# Patient Record
Sex: Male | Born: 1950 | Race: White | Hispanic: No | Marital: Married | Smoking: Former smoker
Health system: Southern US, Community
[De-identification: ages and names within clinical notes are randomized; demographics above are authoritative.]

## PROBLEM LIST (undated history)

## (undated) DIAGNOSIS — E119 Type 2 diabetes mellitus without complications: Secondary | ICD-10-CM

## (undated) DIAGNOSIS — I10 Essential (primary) hypertension: Secondary | ICD-10-CM

## (undated) DIAGNOSIS — E785 Hyperlipidemia, unspecified: Secondary | ICD-10-CM

## (undated) HISTORY — PX: INGUINAL HERNIA REPAIR: SUR1180

## (undated) HISTORY — DX: Hyperlipidemia, unspecified: E78.5

## (undated) HISTORY — DX: Type 2 diabetes mellitus without complications: E11.9

## (undated) HISTORY — DX: Essential (primary) hypertension: I10

---

## 2001-12-21 ENCOUNTER — Encounter: Payer: Self-pay | Admitting: Surgery

## 2001-12-22 ENCOUNTER — Ambulatory Visit (HOSPITAL_COMMUNITY): Admission: RE | Admit: 2001-12-22 | Discharge: 2001-12-22 | Payer: Self-pay | Admitting: Surgery

## 2002-01-13 ENCOUNTER — Ambulatory Visit (HOSPITAL_COMMUNITY): Admission: RE | Admit: 2002-01-13 | Discharge: 2002-01-13 | Payer: Self-pay | Admitting: Surgery

## 2004-02-14 ENCOUNTER — Encounter: Admission: RE | Admit: 2004-02-14 | Discharge: 2004-02-14 | Payer: Self-pay | Admitting: Internal Medicine

## 2005-04-29 ENCOUNTER — Inpatient Hospital Stay (HOSPITAL_COMMUNITY): Admission: EM | Admit: 2005-04-29 | Discharge: 2005-05-01 | Payer: Self-pay | Admitting: Emergency Medicine

## 2005-05-01 ENCOUNTER — Encounter (INDEPENDENT_AMBULATORY_CARE_PROVIDER_SITE_OTHER): Payer: Self-pay | Admitting: Specialist

## 2005-06-14 ENCOUNTER — Inpatient Hospital Stay (HOSPITAL_COMMUNITY): Admission: EM | Admit: 2005-06-14 | Discharge: 2005-06-16 | Payer: Self-pay | Admitting: Emergency Medicine

## 2008-04-20 ENCOUNTER — Encounter: Admission: RE | Admit: 2008-04-20 | Discharge: 2008-04-20 | Payer: Self-pay | Admitting: Internal Medicine

## 2011-01-06 ENCOUNTER — Encounter: Payer: Self-pay | Admitting: Internal Medicine

## 2011-05-03 NOTE — H&P (Signed)
[name], [name]          ACCOUNT NO.:  [id number]   MEDICAL RECORD NO.:  [id number]          PATIENT TYPE:  INP   LOCATION:  0101                         FACILITY:  [facility]   PHYSICIAN:  Mobolaji B. Bakare, M.D.DATE OF BIRTH:  1951/01/08   DATE OF ADMISSION:  06/14/2005  DATE OF DISCHARGE:                                HISTORY & PHYSICAL   PRIMARY CARE DOCTOR:  Dr. Ralene Ok.   GASTROENTEROLOGIST:  Dr. Elnoria Howard.   CHIEF COMPLAINT:  Dizziness.   HISTORY OF PRESENTING COMPLAINT:  [name] is a 60 year old [place]  gentleman who is known with alcohol abuse and alcohol related problems. He  was recently hospitalized in [facility] in May 2006. He started  experiencing dizziness and weakness yesterday. He did not have any syncopal  episode. There was no palpitations, no nausea, vomiting, chest pain. There  has been no diarrhea. The patient continues to drink alcohol. Otherwise,  stated that he has cut back and he drinks 1/5 of whiskey on the rocks. His  wife called Dr. Jacqulyn Bath office and he told to proceed to emergency room  for evaluation. He has since received 500 cc IV fluid bolus and he is  currently on IV fluid at 200 mL per hour. Orthostasis checked after IV fluid  showed a filling blood pressure of 132 supine, which dropped to 119 standing  with a heart rate of 86, which was maintained. The patient is on beta  blocker.  There has been no fever, no chills, no headaches, no dysuria or increased  frequency of micturition. He denies diaphoresis, chest pain, shortness of  breath, cough.  The patient had episode of abdomen pain yesterday, somewhat generalized and  this lasted about 20 minutes. Currently is pain free.   REVIEW OF SYSTEMS:  As in the HPI.   PAST MEDICAL HISTORY:  1.  Hypertension.  2.  Hyperlipidemia.  3.  Duodenitis/gastritis.  4.  Gastric and duodenal ulcers.  5.  Acute renal failure noted in May 2006.  On discharge is BUN and  creatinine were 26/1.5.  6.  Alcohol abuse.  7.  Tobacco abuse.   CURRENT MEDICATIONS:  1.  Lipitor 10 mg p.o. q.d.  2.  Nexium 40 mg p.o. q.d.  3.  Atenolol 100 mg p.o. q. day.  4.  Benicar 20/12.5 p.o. q.d.   ALLERGIES:  CODEINE causes itching.   SOCIAL HISTORY:  The patient continues to drink alcohol and smokes 1/2 pack  a day. He is a Production designer, theatre/television/film with [employer].   FAMILY HISTORY:  No family history of heart disease or cancer. He is married  and lives with his wife.   VITAL SIGNS:  Temperature 98.3, blood pressure 101/69 on initial evaluation  in the emergency department, currently after IV fluid, 132/92, pulse 86,  respiratory rate 18, 02 sat 96%. Orthostatic vitals:  Blood pressure 132/92  and pulse of 86 supine, 123/89 pulse of 81 sitting, 119/89 and pulse of 86  standing.   PHYSICAL EXAMINATION:  GENERAL:  He is not in respiratory distress.  HEENT:  Normocephalic, atraumatic. Pupils equal, round and reactive to  light. Extraocular  movements intact, no carotid bruits. No elevated JVD.  Mucous membranes moist.  LUNGS:  Clear __________ auscultations.  CVS:  S1, S2, regular.  No murmur, no gallop.  ABDOMEN:  Nondistended, soft, nontender. No palpable organomegaly, bowel  sounds presents, no audible systolic murmurs.  EXTREMITIES:  No pedal edema, no calf tenderness. Homan sign negative.  Dorsalis pedis pulses 2+ bilaterally.  CNS:  No focal neurological deficits.  SKIN:  No rash, no petechiae.   INITIAL LABORATORY DATA:  Cardiac markers, myoglobin 378, troponin-R and CK-  MB were normal. Second set of myoglobin was 309 with normal troponin-R and  CK-MB. D-dimer was slightly elevated 0.76. sodium 134, potassium 5.4,  chloride 101, bicarb 36, glucose 104, BUN 27, creatinine 3.0, total  bilirubin 2.9, alkaline phosphatase 76, AST 113, ALT 68. This specimen was  __________ hemolyzed and there was gross lipemia on the specimen. AST, ALT  in Apr 30, 2005 was 48 and 26, just like  the elevated AST with a BUN of 26  and a creatinine of 1.6 on the Apr 30, 2005. Total bilirubin of 1.1. CBC:  White cell 9.1, hemoglobin 12.2, hematocrit 6.5, MCV 96.6, platelets 234.  Neutrophils 50%, lymphocytes 40%, hematocrit and hemoglobin are improved  since last check in May.   ASSESSMENT AND PLAN:  [name] is 60 year old, [place] gentleman with a  history of alcohol abuse, gastric ulcer and duodenal ulcer, hypertension,  hyperlipidemia presenting with dizziness. He is somewhat orthostatic and had  episode of abdominal pain.   Laboratory findings include elevated BUN and creatinine, d-dimer elevated,  myoglobin and bilirubin.   1.  Renal insufficiency. Probably prerenal on a background of positive      orthostasis and ACE inhibitor with hydrochlorothiazide. Will hold      Benicar, recheck Bmet, continued IV fluid normal saline at 150 mL per      hour.  2.  Abdominal pain, which is now resolved. In view of elevated bilirubin,      will check ultrasound of gallbladder. The patient might have passed a      gallstone. Also check amylase and lipase, will use Dilaudid p.r.n. for      pain.  3.  Elevated d-dimer. I suspect this is probably compounded by the renal      insufficiency. The patient has no symptoms to suggest PE or calf      swelling. Will repeat d-dimer in the morning when the patient is fully      hydrated and if still elevated, will pursue further.  4.  Tobacco abuse. Smoking cessation counseling.  5.  Alcohol abuse. The patient is at high risk for alcohol withdrawal. Will      start on alcohol withdrawal protocol with Librium.  6.  Hyperlipidemia. Check fasting lipid profile.  7.  Hypertension. Will hold atenolol for now until the patient is well-      hydrated and blood pressure is sustained, is      stable.  8.  Gastric and duodenal ulcer. Continue on PPIs, gastritis/duodenitis      continue PPIs. 9.  Anemia. Most likely secondary to ulcers. We empirically  use NuIron 150      mg p.o. q.d.       MBB/MEDQ  D:  06/14/2005  T:  06/14/2005  Job:  [id number]   cc:   Ralene Ok, M.D.  [address]  [place]  [place] [address]  Fax: [phone]   Jordan Hawks. Elnoria Howard, MD  Fax: [phone]

## 2011-05-03 NOTE — Discharge Summary (Signed)
[name], [name]          ACCOUNT NO.:  [id number]   MEDICAL RECORD NO.:  [id number]          PATIENT TYPE:  INP   LOCATION:  1407                         FACILITY:  [facility]   PHYSICIAN:  Mallory Shirk, MD     DATE OF BIRTH:  12/05/1951   DATE OF ADMISSION:  06/14/2005  DATE OF DISCHARGE:  06/16/2005                                 DISCHARGE SUMMARY   DISCHARGE DIAGNOSES:  1.  Abdominal pain.  2. Alcohol abuse.  3. Tobacco abuse.  4.      Hyperlipidemia.  5. Hypertension.  6. Normocytic anemia.   DISCHARGE MEDICATIONS:  1.  Lopid 600 mg p.o. b.i.d.  2. Lipitor 20 mg p.o. daily.  3. Nexium 40 mg      p.o. daily.  4. Thiamine 1 mg p.o. daily.  5. Folate 1 mg tablet p.o.      daily.  6. Benicar HCTZ 20/12.5 1 tablet p.o. daily.  7. Atenolol 100 mg      p.o. daily.   Please note changes in outpatient medications:  1. Lopid 600 mg p.o. b.i.d.  has been added.  2. Lipitor has been increased from 10 mg p.o. daily to 20  mg p.o. daily.  3. Thiamine 100 mg p.o. daily.  4. Folate 1 mg p.o. daily  have been added.   FOLLOWUP APPOINTMENTS:  1.  Dr. Salome Spotted, [phone], within two to three days of discharge.  The      patient will call Dr. Wilmer Floor office to make an appointment.  The      patient needs to have a complete metabolic panel checked within seven      days of discharge by  Dr. Cindee Lame  2. Follow up with Dr. Elnoria Howard.  The      patient will call Dr. Haywood Pao office and follow up as needed.   HISTORY OF PRESENT ILLNESS:  Mr. Vonbehren is a very pleasant 60 year old  [place] gentleman who has a history of alcohol abuse and alcohol-related  problems.  The patient was recently hospitalized in 5/06 for abdominal  nausea, vomiting, chronic, intermittent, related to alcohol.  He presented  on 06/14/05 with complaints of dizziness and weakness.  He patient did not  have a syncopal episode.  No palpitations, nausea, vomiting, or chest pain,  no diarrhea.  The patient continued to drink  alcohol since discharge, but he  has now cut down to a fifth of whiskey daily.  On June 14, 2005, the  patient's wife called Dr. Wilmer Floor office, and she was told to bring the  patient to the emergency room for evaluation.  He was found to be  hypotensive and then received fluid bolus to which he responded.  After the  fluid bolus, his systolic blood pressure was 132 supine and dropped to 119  on standing with a heart rate of 86.  The patient has been on a beta blocker  before.  No fever, no chills, no headaches, no dysuria, or frequency of  urination.  No diaphoresis, chest pain, or any other complaints.   PAST MEDICAL HISTORY:  Significant for:  1. Hypertension.  2.  Hyperlipidemia.  3. Duodenitis/gastritis.  4. Gastric and duodenal ulcers.  5. Acute renal failure noted in May of 2006 with a discharge BUN and  creatinine of 26/1.5 on May 01, 2005.  6. Alcohol abuse.  7.  Tobacco abuse.   MEDICATIONS ON ADMISSION:  1.  Lipitor 10 mg p.o. daily.  2. Nexium 40 mg p.o. daily.  3.  Atenolol 100      mg p.o. daily.  4. Benicar 20/12.5 1 tablet p.o. daily.   ALLERGIES:  CODEINE causes itching.   PHYSICAL EXAMINATION ON ADMISSION:  VITAL SIGNS:  Blood pressure 101/69.  Subsequently, went to 132/92 with a half-liter fluid bolus.  Temperature  98.3, respiratory rate 18, O2 saturations 96% on room air.  Pulse 86.  GENERAL:  A middle-aged [place] European gentleman in no acute distress.  HEENT:  Normocephalic, atraumatic.  PERRL.  Sclerae are anicteric.  Mucous  membranes moist.  NECK:  Supple with no LAD, no JVD.  RESPIRATORY:  Lungs are clear to auscultation bilaterally with no wheezes  and no rales.  CARDIOVASCULAR:  S1, S2, regular rate and rhythm with no murmurs, rubs, or  gallops.  ABDOMEN:  Nondistended and soft with positive bowel sounds.  No tenderness  and no masses.  EXTREMITIES:  No cyanosis, clubbing, or edema.  NEUROLOGIC:  Nonfocal.   LABORATORY DATA:  Myoglobin 378,  troponin-I and CK-MB normal.  Sodium 134,  potassium 5.4, chloride 104, bicarbonate 36, glucose 104, BUN 27, creatinine  3.0, total bilirubin 2.9, alkaline phosphatase 76, AST 113, ALT 68.  Total  bilirubin 1.1.  WBC is 9.1, hemoglobin 12.2, hematocrit is 36.5, platelets  234.   IMAGING:  Chest x-ray:  No acute disease.  Ultrasound of the abdomen:  Contracted gallbladder.  Otherwise, negative.   HOSPITAL COURSE:  The patient was admitted to a monitored bed.  1. Nausea,  vomiting, and abdominal pain.  The patient's symptoms resolved during the  hospital stay.  He was initially NPO and then on clear diet.  Subsequently,  diet was advanced to a regular diet.  The patient tolerated the diet well.  On day of discharge, no nausea or vomiting.  The patient is eating a regular  diet.   Problem 2. Alcohol abuse.  The patient was counseled extensively to quit  drinking alcohol.  Outpatient detox/education programs were offered, but the  patient declined to accept these.  The patient's wife at bedside when this  discussion took place.   Problem 3. Tobacco abuse.  The patient was counseled to quit smoking.  Again, he refused any counseling or help.   Problem 4.  Hyperlipidemia.  Lipid profile showed a triglyceride level of  1848 and a cholesterol of 423.  The patient is on Lipitor 10 mg p.o. daily  which was increased to 20, Lopid 600 mg p.o. b.i.d. has been added to his  outpatient regimen.   Problem 5.  Hypertension.  The patient was normotensive during the hospital  stay.  His atenolol and Diovan had been held because of the initial  presentation with some hypotension.  He has been recommended to take these  once he is discharged.  Of note, cardiac enzymes were negative, three sets.   Problem 6. Anemia.  The patient has normocytic anemia.  On day of discharge,  the patient's hemoglobin and hematocrit were 10.6/30/3.  The patient has been followed by Dr. Elnoria Howard.  He has been asked to call Dr.  Elnoria Howard and set up an  outpatient followup.  The anemia is normocytic with MCV of 96.3.  the  patient has also been prescribed thiamine and folate because of his history  of alcohol abuse.   Problem 7. Acute renal insufficiency.  On the day of discharge, the  patient's BUN and creatinine were 16/1.5.  The initial presentation of acute  renal insufficiency secondary to dehydration.  After hydration, the  patient's BUN and creatinine is now within normal limits.   DISPOSITION:  The patient was discharged in stable condition.  On day of  discharge, his vital signs were:  Blood pressure 115/73, pulse 87,  respiratory rate 22, temperature 98.0, with no orthostatic changes in blood  pressure.  Blood pressure supine was 126, sitting up was 119, and standing  120 systolic.  The patient was ambulatory, able to walk the halls without  assistance.   He was advised to abstain from alcohol completely and advised that his  abdominal pain and symptoms will occur even with a single drink of alcohol.  The patient verbalized understanding of this.  He was also advised to take  his medications as prescribed, keep his followup appointments, and return to  the emergency department immediately upon onset of chest pain, shortness of  breath, dizziness, or any other symptom that may need medical attention.       GDK/MEDQ  D:  06/16/2005  T:  06/16/2005  Job:  [id number]   cc:   Salome Spotted, M.D.   Jordan Hawks Elnoria Howard, MD  Fax: [phone]

## 2011-05-03 NOTE — Discharge Summary (Signed)
[name], [name]          ACCOUNT NO.:  [id number]   MEDICAL RECORD NO.:  [id number]          PATIENT TYPE:  INP   LOCATION:  1503                         FACILITY:  [facility]   PHYSICIAN:  Jonna L. Robb Matar, M.D.DATE OF BIRTH:  10/17/1951   DATE OF ADMISSION:  04/29/2005  DATE OF DISCHARGE:  05/01/2005                                 DISCHARGE SUMMARY   PRIMARY CARE PHYSICIAN:  Ralene Ok, M.D.   GASTROENTEROLOGIST:  Jordan Hawks. Elnoria Howard, MD   FINAL DIAGNOSES:  1. Gastric and duodenal ulcers.  2. Gastritis, duodenitis.  3. Hypertension.  4. Hyperlipidemia.  5. Alcohol and tobacco abuse.  6. Acute renal failure.  7. Hypotension secondary to hypovolemia.     PROCEDURES:  Esophagogastroduodenoscopy on May 17th.   ALLERGIES:  CODEINE, gives him itching.   CODE STATUS:  Full.   HISTORY:  This 60 year old Polish-American male has an underlying history of  hypertension, hyperlipidemia, and chronic alcohol and tobacco use.  He had 3-  4 months of abdominal pain, nausea, vomiting, and heartburn, for which he  was taking Alka-Seltzer.  He has been having some weight loss, anorexia, and  the day of admission, he was getting very weak.   PHYSICAL EXAMINATION ON ADMISSION:  VITAL SIGNS:  He was mildly hypotensive.  Blood pressure came back up to 111 systolic status post a liter of fluids.  ABDOMEN:  Positive findings included abdominal tenderness, positive bowel  sounds.  No asterixis.   Initial laboratory work showed a BUN of 26, creatinine 1.5.  Normal liver  function tests, lipase, and amylase.  Platelet count slightly low at 126.   HOSPITAL COURSE:  The patient was rehydrated, placed on a nicotine patch.  The CAT scan was unremarkable.  He was put on multivitamins.  He was seen in  consultation with Dr. Jeani Hawking on May 16, who found on upper endoscopy  that the patient had multiple ulcers and gastritis.   DISPOSITION:  Patient is going to be discharged on his usual  medications of  Lipitor 10 daily, atenolol 100 daily, Benicar/HCT 20/12.5 daily, and with  the addition of Nexium 40 mg daily.  He is to take Maalox, Mylanta, or Tums  for his heartburn, although he may find with the Nexium, that clears up as  well.  He has been told not to take any aspirin, anti-inflammatories or  alcohol.  He can return to work on Monday, May 22.  He is to call Dr. Elnoria Howard  for an appointment in two weeks for followup.   TIME:  Forty minutes.      JLB/MEDQ  D:  05/01/2005  T:  05/01/2005  Job:  [id number]   cc:   Jordan Hawks. Elnoria Howard, MD  Fax: [phone]   Ralene Ok, M.D.  [address]  [place]  [place] [address]  Fax: [phone]

## 2011-05-03 NOTE — H&P (Signed)
NAMECOLLYN, [name]          ACCOUNT NO.:  [id number]   MEDICAL RECORD NO.:  [id number]          PATIENT TYPE:  EMS   LOCATION:  ED                           FACILITY:  [facility]   PHYSICIAN:  Gertha Calkin, M.D.DATE OF BIRTH:  02-Jan-1951   DATE OF ADMISSION:  04/29/2005  DATE OF DISCHARGE:                                HISTORY & PHYSICAL   PRIMARY CARE PHYSICIAN:  Dr. Ludwig Clarks.   HISTORY OF PRESENT ILLNESS:  This is a pleasant 60 year old [place] born  Caucasian male with a history significant for hypertension, hyperlipidemia,  chronic alcohol and tobacco use, who presents with abdominal pain and nausea  and vomiting times 3-4 months, intermittently.  The patient denies blood or  coffee ground emesis.  He also has noticed decreased p.o. intake secondary  to nausea and vomiting in presenting symptoms.  When further asked to be  more descriptive in detail in describing the abdominal pain, he points to  the periumbilical area and states that the pain is mostly located in that  area.  The patient denies pain radiation.  He denies sick contacts.  Note -  the patient states that he has had probable loss of weight (subjective)  secondary to his clothes fitting a lot more loose in the last few months.  His wife is here and confirms that pretty much since the beginning of this  year, in January, his appetite has decreased.  The patient denies having  previous symptoms prior to the onset of this protracted episode.  No  significant diarrhea.   PAST MEDICAL HISTORY:  1.  Hyperlipidemia.  2.  Hypertension.  3.  Alcohol use.  4.  Tobacco use.   MEDICATIONS:  1.  Lipitor 10 mg p.o. daily.  2.  Atenolol 100 mg p.o. daily.  3.  Benicar/HCT 20/12.5 mg p.o. daily.   ALLERGIES:  CODEINE causes him to itch.   FAMILY HISTORY:  Denies a history of coronary artery disease, peripheral  vascular disease, strokes, aneurysms, cancers, or diabetes.  Mother's side  of the family is known to have  high blood pressure.  He does not know his  father.   SOCIAL HISTORY:  The patient is married and lives here in [place].  He  works at [employer] as a Production assistant, radio.  No illicit drug use ever.  He states that he  drinks approximately a half bottle to a full bottle of scotch 2-3 times a  week, and has been doing that since he was 19.  He also has positive  tobacco, approximately a half a pack to a pack a day since he was 26.  Wife  states that he is underestimating his drinking.  He is married twice now.  His wife is well.  He has an ex.  They both have 3 kids apiece, but not  together.   FAMILY HISTORY:  No medical illnesses in his kids that he is aware of.  He  had 1 brother who died at the age of 23 back in [place].   REVIEW OF SYSTEMS:  Weight loss, subjective, and night sweats, per wife.  The patient also has symptoms  as described in the HPI.  No dysuria,  hematuria.  No hematemesis, hemoptysis, hematochezia, or melanotic stools.  Decrease in appetite, as described above.  No headaches, blurred vision, or  sinus problems.  He may be developing allergies, as he has had a rhinorrhea  the last few days.  The patient denies dysuria, constipation, or diarrhea.  Otherwise, review of systems is negative.   PHYSICAL EXAMINATION:  VITAL SIGNS:  Temperature is 97.0, blood pressure  111/72 (after receiving a liter of fluid).  Pulse of 62, respirations 20.  Pulse oximetry 97%.  (Please note initial blood pressure on ED record is  68/42, pulse 78, and respirations 20).  GENERAL:  This is a well-developed, well-nourished Caucasian male lying in  bed in no acute distress.  HEENT:  No nystagmus.  Pupils equal, round and reactive to light.  Extraocular movements area intact.  Posterior oropharynx is clear without  erythema or exudate.  He has moist mucous membranes.  NECK:  Supple without JVP.  No masses, no bruits.  CHEST:  Clear to auscultation bilaterally with good air movement.  No  accessory muscle  use.  CARDIOVASCULAR:  Regular rate and rhythm.  No murmurs, rubs, or gallops.  PMI is nondisplaced.  ABDOMEN:  Slight decreased tenderness throughout.  No guarding, no  hepatosplenomegaly.  Was unable to feel the edge of his liver.  There is  basically dullness to percussion.  No flank tenderness, and bowel sounds are  present.  EXTREMITIES:  Without clubbing, cyanosis, or edema.  Pulses are intact and  symmetric, upper and lower.  SKIN:  There are spider angiomas in the upper torso area.  There are no  engorged vessels in his abdomen.  NEUROLOGIC:  Alert and oriented x3.  Cranial nerves are without deficits.  No asterixis.  He has good balance, good gait.  Dorsal columns are intact.  His motor strength and sensation are without any focal deficits.  RECTAL:  Normal tone.  No external hemorrhoids.  No stool in the vault.  Normal prostate.   LABORATORY DATA:  1.  White count of 11.3, hemoglobin 13.9, platelets of 154.  Sodium of 132,      potassium 3.5, chloride 99, bicarb 21.  Glucose of 102, calcium 8.6, BUN      29, creatinine 2.1.  Total protein of 6.3, albumin of 3.1, AST of 60,      ALT of 32, alkaline phosphatase of 60, total bilirubin of 1.3.  2.  Abdominal ultrasound shows normal gallbladder and common bile duct.  Non-      visualized pancreas.  There is a question of slight fullness of the      right renal collecting system.  No gross obstructions noted.  3.  Chest x-ray was negative for any acute infiltrates.  No cardiomegaly.  4.  Alcohol level was 151, which was elevated.  5.  Amylase is 37.  6.  Lipase is 53, slightly elevated (upper limits of normal is 50).   ASSESSMENT AND PLAN:  1.  Abdominal pain/nausea and vomiting, chronic and intermittent.  2.  Hypertension.  3.  Elevated creatinine (question acute renal failure versus acute-on-      chronic, versus chronic).  4.  Hyperlipidemia.  5.  Alcohol abuse. 6.  Tobacco abuse.  7.  Deep vein  thrombosis/gastrointestinal prophylaxis.   PLAN:  The plan is to admit with the presumptive diagnosis of pancreatitis,  but also in the differential is peptic ulcer disease, and given his history  of weight loss, night sweats, and his other risk factors, cancer is also a  possibility.  Therefore, we will check a CT scan with contrast to help  differentiate.  Until then, we will work with the presumptive diagnosis of  pancreatitis, keep him NPO, and give him IV fluids.  Since he has a history  of alcoholism, will initial MVI, Foley, and thiamine, as well as withdrawal  precautions.  Given his initial blood pressure, will hold his  antihypertensive medications for now, and continue with fluids, since he has  responded well so far with fluids  alone.  Will follow up his electrolytes after IV hydration, and if there is  no improvement, then need to consider work-up for chronic renal  insufficiency.  We will place him on a nicotine patch, as well as provide  tobacco counseling and alcohol abuse counseling.      JD/MEDQ  D:  04/29/2005  T:  04/29/2005  Job:  [id number]   cc:   Dr. Ludwig Clarks

## 2011-05-03 NOTE — Consult Note (Signed)
NAMEBARBARA, [name]          ACCOUNT NO.:  [id number]   MEDICAL RECORD NO.:  [id number]          PATIENT TYPE:  INP   LOCATION:  1503                         FACILITY:  [facility]   PHYSICIAN:  Jordan Hawks. Elnoria Howard, MD    DATE OF BIRTH:  [birth year]   DATE OF CONSULTATION:  04/30/2005  DATE OF DISCHARGE:                                   CONSULTATION   REASON FOR CONSULTATION:  Abdominal pain, nausea and vomiting.   PRIMARY CARE PHYSICIAN:  Ralene Ok, M.D.   HISTORY OF PRESENT ILLNESS:  This is a 60 year old Polish-American with a  past medical history of hypertension, hyperlipidemia, alcohol use, tobacco  abuse, and history of colon polyps, status post removal in December, 2005 by  Dr. Jeani Hawking, who presents with persistent and worsening abdominal pain  associated with nausea and vomiting.  The patient states that the nausea and  vomiting has been an ongoing issue for the past 4-5 months.  He states that  initially it was on a weekly basis, but then it became more persistent.  He  is unable to correlate any types of po intake that are associated with this.  The worsening of these symptoms, he does report having a history of  gastroesophageal reflux disease.  The patient states that he would self-  medicate with Alka-Seltzer with good results; however, the reflux-type  symptoms were not similar to that of his abdominal pain and nausea and  vomiting.  The abdominal pain is not consistent, is intermittent, and does  not awaken him from sleep.  He states that at times it can be sharp, but he  is not certain of any modifying factors regarding improvement or worsening  of his abdominal pain.  The patient was brought into the emergency room at  [facility] by his wife because the patient's blood pressure was low, and  subsequently, he was admitted for the possibility of pancreatitis.  He  denies having any diarrhea or constipation.  No reports of any hematochezia  or melena.   PAST  MEDICAL HISTORY:  As stated above.   PAST SURGICAL HISTORY:  As stated above.   MEDICATIONS:  Lipitor, atenolol, and Benicar.   ALLERGIES:  CODEINE, which results in itching.   FAMILY HISTORY:  Significant for cardiovascular disease.   SOCIAL HISTORY:  Patient is married and lives with his wife.  He drinks  about a full bottle of Scotch about three times per week.  He smokes a half  pack per day for the past 25 years.   FAMILY HISTORY:  Noncontributory.   REVIEW OF SYSTEMS:  As stated in the history of present illness, negative  for any headache, dizziness, blurry vision, dysuria, dysphagia, arthritis or  arthralgias.   PHYSICAL EXAMINATION:  VITAL SIGNS:  Blood pressure 141/93, heart rate 67,  respirations 20, temperature 98.1.  GENERAL:  The patient is in no acute distress.  Alert and oriented.  HEENT:  Normocephalic and atraumatic.  Extraocular muscles are intact.  Pupils are equal, round and reactive to light.  NECK:  Supple.  No lymphadenopathy.  LUNGS:  Clear to auscultation bilaterally.  CARDIOVASCULAR:  Regular rate and rhythm without murmurs, rubs or gallops.  ABDOMEN:  Flat, soft, nontender, nondistended.  No hepatosplenomegaly.  EXTREMITIES:  No clubbing, cyanosis or edema.   LABORATORY VALUES:  On Apr 30, 2005, white blood cell count is 9.4,  hemoglobin 11.1.  MCV 95.2, platelets 126.  Sodium 136, potassium 3.8,  chloride 106, CO2 25, glucose 84, BUN 26, creatinine 1.5, total bilirubin  1.1, alkaline phosphatase 52, AST 48, ALT 26, albumin 2.6.  Lipase at 39.   IMPRESSION:  1.  Abdominal pain.  2.  Nausea and vomiting.  3.  Acute renal failure.  4.  Alcohol abuse.  5.  Gastroesophageal reflux disease.   After discussion with the patient, the patient does not appear to have a  pancreatitis by objective evidence.  The patient's symptoms can be  consistent with esophagitis, however, in regards to his history of  gastroesophageal reflux disease.  There is no  report of any dysphagia,  weight loss, and no other alarming signs at this time.  There is no evidence  of any acute blood loss.  The prior colonoscopy was significant for benign  polyps; however, no other findings were noted at that time.  I do believe  the patient does drink a significant amount of alcohol, but this does not  appear to be a contributing factor to his current admission at this time.  Other considerations are possibly peptic ulcer disease, less likely  gastroparesis.  There is no evidence of any gallbladder disease.   PLAN:  1.  To perform an EGD, schedule permitting.  2.  Continue the PPI.      PDH/MEDQ  D:  04/30/2005  T:  04/30/2005  Job:  [id number]   cc:   Ralene Ok, M.D.  [address]  [place]  [place] [address]  Fax: [phone]

## 2013-07-16 ENCOUNTER — Ambulatory Visit
Admission: RE | Admit: 2013-07-16 | Discharge: 2013-07-16 | Disposition: A | Discharge status: Home / Self Care | Payer: 59 | Source: Ambulatory Visit | Attending: Internal Medicine | Admitting: Internal Medicine

## 2013-07-16 ENCOUNTER — Other Ambulatory Visit: Payer: Self-pay | Admitting: Internal Medicine

## 2013-07-16 DIAGNOSIS — R509 Fever, unspecified: Secondary | ICD-10-CM

## 2013-07-16 DIAGNOSIS — R05 Cough: Secondary | ICD-10-CM

## 2013-08-25 ENCOUNTER — Ambulatory Visit
Admission: RE | Admit: 2013-08-25 | Discharge: 2013-08-25 | Disposition: A | Discharge status: Home / Self Care | Payer: 59 | Source: Ambulatory Visit | Attending: Internal Medicine | Admitting: Internal Medicine

## 2013-08-25 ENCOUNTER — Other Ambulatory Visit: Payer: Self-pay | Admitting: Internal Medicine

## 2013-08-25 DIAGNOSIS — J189 Pneumonia, unspecified organism: Secondary | ICD-10-CM

## 2021-09-04 ENCOUNTER — Emergency Department (HOSPITAL_COMMUNITY): Payer: Medicare Other

## 2021-09-04 ENCOUNTER — Emergency Department (HOSPITAL_COMMUNITY)
Admission: EM | Admit: 2021-09-04 | Discharge: 2021-09-04 | Disposition: A | Discharge status: Home / Self Care | Payer: Medicare Other | Attending: Emergency Medicine | Admitting: Emergency Medicine

## 2021-09-04 ENCOUNTER — Other Ambulatory Visit: Payer: Self-pay

## 2021-09-04 DIAGNOSIS — R0789 Other chest pain: Secondary | ICD-10-CM | POA: Diagnosis present

## 2021-09-04 DIAGNOSIS — F17200 Nicotine dependence, unspecified, uncomplicated: Secondary | ICD-10-CM | POA: Diagnosis not present

## 2021-09-04 DIAGNOSIS — R918 Other nonspecific abnormal finding of lung field: Secondary | ICD-10-CM | POA: Insufficient documentation

## 2021-09-04 DIAGNOSIS — I10 Essential (primary) hypertension: Secondary | ICD-10-CM | POA: Insufficient documentation

## 2021-09-04 LAB — COMPREHENSIVE METABOLIC PANEL
ALT: 21 U/L (ref 0–44)
AST: 28 U/L (ref 15–41)
Albumin: 3.9 g/dL (ref 3.5–5.0)
Alkaline Phosphatase: 55 U/L (ref 38–126)
Anion gap: 14 (ref 5–15)
BUN: 18 mg/dL (ref 8–23)
CO2: 21 mmol/L — ABNORMAL LOW (ref 22–32)
Calcium: 9.3 mg/dL (ref 8.9–10.3)
Chloride: 100 mmol/L (ref 98–111)
Creatinine, Ser: 1.16 mg/dL (ref 0.61–1.24)
GFR, Estimated: >60 mL/min (ref >60)
Glucose, Bld: 127 mg/dL — ABNORMAL HIGH (ref 70–99)
Potassium: 3.6 mmol/L (ref 3.5–5.1)
Sodium: 135 mmol/L (ref 135–145)
Total Bilirubin: 0.6 mg/dL (ref 0.3–1.2)
Total Protein: 7.2 g/dL (ref 6.5–8.1)

## 2021-09-04 LAB — CBC WITH DIFFERENTIAL/PLATELET
Abs Immature Granulocytes: 0.04 10*3/uL (ref 0.00–0.07)
Basophils Absolute: 0.1 10*3/uL (ref 0.0–0.1)
Basophils Relative: 1 %
Eosinophils Absolute: 0.2 10*3/uL (ref 0.0–0.5)
Eosinophils Relative: 2 %
HCT: 40.4 % (ref 39.0–52.0)
Hemoglobin: 13.7 g/dL (ref 13.0–17.0)
Immature Granulocytes: 0 %
Lymphocytes Relative: 27 %
Lymphs Abs: 2.8 10*3/uL (ref 0.7–4.0)
MCH: 32.2 pg (ref 26.0–34.0)
MCHC: 33.9 g/dL (ref 30.0–36.0)
MCV: 94.8 fL (ref 80.0–100.0)
Monocytes Absolute: 1 10*3/uL (ref 0.1–1.0)
Monocytes Relative: 9 %
Neutro Abs: 6.3 10*3/uL (ref 1.7–7.7)
Neutrophils Relative %: 61 %
Platelets: 172 10*3/uL (ref 150–400)
RBC: 4.26 MIL/uL (ref 4.22–5.81)
RDW: 13.2 % (ref 11.5–15.5)
WBC: 10.3 10*3/uL (ref 4.0–10.5)
nRBC: 0 % (ref 0.0–0.2)

## 2021-09-04 LAB — TROPONIN I (HIGH SENSITIVITY)
Troponin I (High Sensitivity): 6 ng/L (ref <18)
Troponin I (High Sensitivity): 6 ng/L (ref <18)

## 2021-09-04 LAB — LIPASE, BLOOD: Lipase: 39 U/L (ref 11–51)

## 2021-09-04 MED ORDER — IOHEXOL 350 MG/ML SOLN
65.0000 mL | Freq: Once | INTRAVENOUS | Status: AC | PRN
  Administered 2021-09-04: 65 mL via INTRAVENOUS

## 2021-09-04 MED ORDER — MELOXICAM 7.5 MG PO TABS: 7.5000 mg | ORAL_TABLET | Freq: Two times a day (BID) | ORAL | 0 refills | Status: AC | PRN

## 2021-09-04 NOTE — Discharge Instructions (Addendum)
Unfortunately the CT scan of your chest shows that you have a new mass in your lung on the left side which is likely a cancer.  I would like for you to call your doctors office in the morning, please let them know this, you will need to be seen by the cancer doctors.  When you call the office let them know "I was diagnosed with lung cancer and the doctor in the emergency department says I need to be seen this week".  This will likely cause some pain in the left side of your chest and may be some shortness of breath.  I will prescribe an anti-inflammatory called meloxicam which you may take twice a day as needed for pain.  I have given you a copy of your CT scan results, share this with your doctor

## 2021-09-04 NOTE — ED Notes (Signed)
Pt A&Ox4, GCS 15, sitting on side of bed in POC. Pt reporting significant left sided flank pain, denies changes in urination, denies having this pain previously. Pt reports discomfort stated this am.

## 2021-09-04 NOTE — ED Provider Notes (Signed)
[facility] EMERGENCY DEPARTMENT Provider Note   CSN: [id number] Arrival date & time: 09/04/21  4970     History Chief Complaint  Patient presents with   Chest Pain    [name] is a 70 y.o. male.   Chest Pain  This patient is a very pleasant 70 year old male, he reports a history of several medical problems including hypertension, cholesterol, he is also a smoker.  He reports that this morning he had an episode where he was very short of breath and has had some pain in the left side of his hemithorax since that time, this is around the left back radiating around the left side to the left anterior chest.  It is worse when he takes a deep breath, worse when he tries to move around and not associated with any swelling of his legs though he does endorse that he has some varicose veins in his legs.  No recent surgery, no recent trauma, no recent reason to be hypercoagulable.  The patient denies any fevers chills nausea vomiting or diarrhea, he has not been coughing or particular short of breath before this happened this morning.  No past medical history on file.  There are no problems to display for this patient.  Denies chronic PMH Denies surgery Endorses Tobacco use   No family history on file.     Home Medications Prior to Admission medications   Medication Sig Start Date End Date Taking? Authorizing Provider  meloxicam (MOBIC) 7.5 MG tablet Take 1 tablet (7.5 mg total) by mouth 2 (two) times daily as needed for up to 14 days for pain. 09/04/21 09/18/21 Yes Noemi Chapel, MD    Allergies    Patient has no known allergies.  Review of Systems   Review of Systems  Cardiovascular:  Positive for chest pain.  All other systems reviewed and are negative.  Physical Exam Updated Vital Signs BP (!) 161/108 (BP Location: Right Arm)   Pulse (!) 55   Temp 97.9 F (36.6 C) (Oral)   Resp 13   Ht 1.803 m (5\' 11" )   Wt 90.7 kg   SpO2 99%   BMI 27.89  kg/m   Physical Exam Vitals and nursing note reviewed.  Constitutional:      General: He is not in acute distress.    Appearance: He is well-developed.  HENT:     Head: Normocephalic and atraumatic.     Mouth/Throat:     Pharynx: No oropharyngeal exudate.  Eyes:     General: No scleral icterus.       Right eye: No discharge.        Left eye: No discharge.     Conjunctiva/sclera: Conjunctivae normal.     Pupils: Pupils are equal, round, and reactive to light.  Neck:     Thyroid: No thyromegaly.     Vascular: No JVD.  Cardiovascular:     Rate and Rhythm: Normal rate and regular rhythm.     Heart sounds: Normal heart sounds. No murmur heard.   No friction rub. No gallop.  Pulmonary:     Effort: Pulmonary effort is normal. No respiratory distress.     Breath sounds: Normal breath sounds. No wheezing or rales.  Abdominal:     General: Bowel sounds are normal. There is no distension.     Palpations: Abdomen is soft. There is no mass.     Tenderness: There is no abdominal tenderness.  Musculoskeletal:  General: No tenderness. Normal range of motion.     Cervical back: Normal range of motion and neck supple.     Right lower leg: No edema.     Left lower leg: No edema.     Comments: There is no edema of the bilateral lower extremities however there does appear to be some torturous varicosities.  These are symmetrical and bilateral, there is no tenderness and no edema  Lymphadenopathy:     Cervical: No cervical adenopathy.  Skin:    General: Skin is warm and dry.     Findings: No erythema or rash.     Comments: There is no rash over the chest wall and there is no tenderness over the chest wall at the location of the discomfort  Neurological:     Mental Status: He is alert.     Coordination: Coordination normal.  Psychiatric:        Behavior: Behavior normal.    ED Results / Procedures / Treatments   Labs (all labs ordered are listed, but only abnormal results are  displayed) Labs Reviewed  COMPREHENSIVE METABOLIC PANEL - Abnormal; Notable for the following components:      Result Value   CO2 21 (*)    Glucose, Bld 127 (*)    All other components within normal limits  CBC WITH DIFFERENTIAL/PLATELET  LIPASE, BLOOD  TROPONIN I (HIGH SENSITIVITY)  TROPONIN I (HIGH SENSITIVITY)    EKG EKG Interpretation  Date/Time:  Tuesday September 04 2021 09:02:58 EDT Ventricular Rate:  74 PR Interval:  138 QRS Duration: 84 QT Interval:  396 QTC Calculation: 439 R Axis:   -24 Text Interpretation: Sinus rhythm with Premature supraventricular complexes Otherwise normal ECG Confirmed by Noemi Chapel [phone]) on 09/04/2021 6:05:04 PM  Radiology DG Chest 2 View  Result Date: 09/04/2021 CLINICAL DATA:  Chest pain. EXAM: CHEST - 2 VIEW COMPARISON:  08/25/2013 FINDINGS: Heart size and mediastinal contours appear normal. No pleural effusion. Left upper lobe pulmonary nodule is identified which measures 2.5 cm and is new from previous exam. The right lung appears clear. Visualized osseous structures are unremarkable. IMPRESSION: 1. No acute cardiopulmonary abnormalities. 2. Left upper lobe pulmonary nodule. Recommend further evaluation with contrast enhanced CT of the chest. Electronically Signed   By: Kerby Moors M.D.   On: 09/04/2021 09:57   CT Angio Chest PE W and/or Wo Contrast  Result Date: 09/04/2021 CLINICAL DATA:  PE suspected, high prob has left upper lobe nodule and risk for PE - L sided CP / SOB EXAM: CT ANGIOGRAPHY CHEST WITH CONTRAST TECHNIQUE: Multidetector CT imaging of the chest was performed using the standard protocol during bolus administration of intravenous contrast. Multiplanar CT image reconstructions and MIPs were obtained to evaluate the vascular anatomy. CONTRAST:  29mL OMNIPAQUE IOHEXOL 350 MG/ML SOLN COMPARISON:  Radiograph earlier today. FINDINGS: Cardiovascular: Excellent opacification of the pulmonary arteries. There are no filling defects  within the pulmonary arteries to suggest pulmonary embolus. Atherosclerosis of the thoracic aorta. 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 Can not assess for dissection given phase of contrast tailored to pulmonary artery evaluation. There is no periaortic stranding. Coronary artery calcifications. Upper normal heart size. No pericardial effusion. Mediastinum/Nodes: 9 mm left lower paratracheal node, series 5, image 57. Increased number of multiple additional small mediastinal lymph nodes. 8 mm left hilar node, series 5, image 68. There is also an 8 mm right hilar node, series  5, image 66. 7 mm right supraclavicular node, series 5, image 16. No axillary adenopathy. No visualized thyroid nodule. No esophageal wall thickening. Lungs/Pleura: Spiculated left upper lobe pulmonary nodule measures 3.1 x 2.5 x 2.2 cm. Margins are spiculated and lobulated. Spiculations extending peripherally to the pleural surface, posteriorly to the fissure. Perifissural 5 mm nodule abutting the minor fissure on the right, series 6, image 95. There is mild biapical pleuroparenchymal scarring. Mild emphysema and lower lobe bronchial thickening. The trachea and central bronchi are patent. No pleural fluid. Upper Abdomen: No acute upper abdominal findings. Colonic diverticulosis at the splenic flexure. No visualized adrenal nodule, although the adrenal glands are not entirely included in the field of view. Musculoskeletal: Flowing anterior osteophytes throughout the thoracic spine. Bone marrow slightly heterogeneous but no destructive lytic or focal blastic osseous lesions. Review of the MIP images confirms the above findings. IMPRESSION: 1. No pulmonary embolus. 2. Spiculated left upper lobe pulmonary nodule measuring 3.1 x 2.5 x 2.2 cm, highly suspicious for primary bronchogenic malignancy. 3. Small mediastinal and bilateral hilar lymph nodes are nonspecific in  this setting. There is also a prominent right supraclavicular node. 4. 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 Recommend annual imaging followup by CTA or MRA. This recommendation follows 2010 ACCF/AHA/AATS/ACR/ASA/SCA/SCAI/SIR/STS/SVM Guidelines for the Diagnosis and Management of Patients with Thoracic Aortic Disease. Circulation. 2010; 121: U272-Z366. Aortic aneurysm NOS (ICD10-I71.9) 5. Mild emphysema and bronchial thickening. 6. Coronary artery calcifications and aortic atherosclerosis. Aortic Atherosclerosis (ICD10-I70.0) and Emphysema (ICD10-J43.9). Electronically Signed   By: Keith Rake M.D.   On: 09/04/2021 20:37    Procedures Procedures   Medications Ordered in ED Medications  iohexol (OMNIPAQUE) 350 MG/ML injection 65 mL (65 mLs Intravenous Contrast Given 09/04/21 2021)    ED Course  I have reviewed the triage vital signs and the nursing notes.  Pertinent labs & imaging results that were available during my care of the patient were reviewed by me and considered in my medical decision making (see chart for details).    MDM Rules/Calculators/A&P                           Interestingly there does appear to be an abnormal chest x-ray with a left upper lobe pulmonary nodule which is recommended to have a CT scan.  It is 2.5 cm.  Because of this nodule and the risk for possible pulmonary embolism I will go ahead and get a CT angiogram of the chest, D-dimer would not be indicated at this time as the patient has an increased risk and already needs a CT scan.  Other testing is unremarkable, the patient is well-appearing, vital signs are unremarkable except for mild hypertension  The CT scan of the chest shows that the patient does not fact have a left-sided neoplasm as the most likely answer.  I have discussed these findings with the patient and he is aware that there is a cancer there most likely, he will follow-up with the cancer center.   I have given him the phone number for the cancer center as well as sent a referral.  Final Clinical Impression(s) / ED Diagnoses Final diagnoses:  Mass of left lung    Rx / DC Orders ED Discharge Orders          Ordered    meloxicam (MOBIC) 7.5 MG tablet  2 times daily PRN        09/04/21  2113             Noemi Chapel, MD 09/04/21 2115

## 2021-09-04 NOTE — ED Provider Notes (Signed)
Emergency Medicine Provider Triage Evaluation Note  [name] , Hill 70 y.o. male  was evaluated in triage.  Pt complains of CP. Located to Left posterior back and wraps around front. Worse with deep breathing. Mild SOB. Occasional cough. No hx of PE, DVT, recent surgery, hx of AAA, dissection. No LE edema  Review of Systems  Positive: CP, SOB Negative: Abd pain, LE edema  Physical Exam  BP (!) 135/93 (BP Location: Right Arm)   Pulse 71   Temp 98.8 F (37.1 C) (Oral)   Resp 16   SpO2 96%  Gen:   Awake, no distress   Resp:  Normal effort  MSK:   Moves extremities without difficulty  Other:    Medical Decision Making  Medically screening exam initiated at 9:05 AM.  Appropriate orders placed.  [name] was informed that the remainder of the evaluation will be completed by another provider, this initial triage assessment does not replace that evaluation, and the importance of remaining in the ED until their evaluation is complete.  CP, SOB   Kristopher Nole A, PA-C 09/04/21 0908    Truddie Hidden, MD 09/04/21 1014

## 2021-09-04 NOTE — ED Notes (Signed)
Patient transported to CT 

## 2021-09-04 NOTE — ED Triage Notes (Signed)
Pt arrived by EMS complaining of chest and abdominal pain that started this morning Pain radiates to his back, worse with movement   Per EMS 12 lead NSR HTN 175/115 115 CBG   EMS gave 324 ASA and 1 nitro without pain relief

## 2021-09-05 ENCOUNTER — Encounter: Payer: Self-pay | Admitting: *Deleted

## 2021-09-05 ENCOUNTER — Telehealth: Payer: Self-pay | Admitting: Internal Medicine

## 2021-09-05 DIAGNOSIS — R918 Other nonspecific abnormal finding of lung field: Secondary | ICD-10-CM

## 2021-09-05 NOTE — Progress Notes (Signed)
I received referral on [name]. I updated new patient coordinator to call and schedule him to be seen on 9/28 with labs.

## 2021-09-05 NOTE — Telephone Encounter (Signed)
Scheduled appt per 9/21 referral. Pt is aware of appt date and time.  

## 2021-09-12 ENCOUNTER — Inpatient Hospital Stay: Payer: Medicare Other

## 2021-09-12 ENCOUNTER — Encounter: Payer: Self-pay | Admitting: Internal Medicine

## 2021-09-12 ENCOUNTER — Other Ambulatory Visit: Payer: Self-pay

## 2021-09-12 ENCOUNTER — Inpatient Hospital Stay: Payer: Medicare Other | Attending: Internal Medicine | Admitting: Internal Medicine

## 2021-09-12 VITALS — BP 177/96 | HR 59 | Temp 98.4°F | Resp 18 | Wt 196.4 lb

## 2021-09-12 DIAGNOSIS — R911 Solitary pulmonary nodule: Secondary | ICD-10-CM | POA: Insufficient documentation

## 2021-09-12 DIAGNOSIS — Z87891 Personal history of nicotine dependence: Secondary | ICD-10-CM | POA: Insufficient documentation

## 2021-09-12 DIAGNOSIS — C3490 Malignant neoplasm of unspecified part of unspecified bronchus or lung: Secondary | ICD-10-CM

## 2021-09-12 DIAGNOSIS — R918 Other nonspecific abnormal finding of lung field: Secondary | ICD-10-CM

## 2021-09-12 LAB — CMP (CANCER CENTER ONLY)
ALT: 15 U/L (ref 0–44)
AST: 19 U/L (ref 15–41)
Albumin: 3.9 g/dL (ref 3.5–5.0)
Alkaline Phosphatase: 66 U/L (ref 38–126)
Anion gap: 11 (ref 5–15)
BUN: 27 mg/dL — ABNORMAL HIGH (ref 8–23)
CO2: 22 mmol/L (ref 22–32)
Calcium: 9.3 mg/dL (ref 8.9–10.3)
Chloride: 106 mmol/L (ref 98–111)
Creatinine: 1.74 mg/dL — ABNORMAL HIGH (ref 0.61–1.24)
GFR, Estimated: 42 mL/min — ABNORMAL LOW (ref >60)
Glucose, Bld: 152 mg/dL — ABNORMAL HIGH (ref 70–99)
Potassium: 4.5 mmol/L (ref 3.5–5.1)
Sodium: 139 mmol/L (ref 135–145)
Total Bilirubin: 0.6 mg/dL (ref 0.3–1.2)
Total Protein: 7.5 g/dL (ref 6.5–8.1)

## 2021-09-12 LAB — CBC WITH DIFFERENTIAL (CANCER CENTER ONLY)
Abs Immature Granulocytes: 0.05 10*3/uL (ref 0.00–0.07)
Basophils Absolute: 0.1 10*3/uL (ref 0.0–0.1)
Basophils Relative: 1 %
Eosinophils Absolute: 0.3 10*3/uL (ref 0.0–0.5)
Eosinophils Relative: 3 %
HCT: 39.8 % (ref 39.0–52.0)
Hemoglobin: 13.7 g/dL (ref 13.0–17.0)
Immature Granulocytes: 1 %
Lymphocytes Relative: 42 %
Lymphs Abs: 3.6 10*3/uL (ref 0.7–4.0)
MCH: 32.2 pg (ref 26.0–34.0)
MCHC: 34.4 g/dL (ref 30.0–36.0)
MCV: 93.6 fL (ref 80.0–100.0)
Monocytes Absolute: 0.9 10*3/uL (ref 0.1–1.0)
Monocytes Relative: 11 %
Neutro Abs: 3.6 10*3/uL (ref 1.7–7.7)
Neutrophils Relative %: 42 %
Platelet Count: 190 10*3/uL (ref 150–400)
RBC: 4.25 MIL/uL (ref 4.22–5.81)
RDW: 13 % (ref 11.5–15.5)
WBC Count: 8.5 10*3/uL (ref 4.0–10.5)
nRBC: 0 % (ref 0.0–0.2)

## 2021-09-12 MED ORDER — OXYCODONE-ACETAMINOPHEN 5-325 MG PO TABS: 1.0000 | ORAL_TABLET | Freq: Three times a day (TID) | ORAL | 0 refills | Status: DC | PRN

## 2021-09-12 NOTE — Progress Notes (Signed)
[place] [phone]   [phone]  CONSULT NOTE  REFERRING PHYSICIAN: Dr. Noemi Chapel  REASON FOR CONSULTATION:  70 years old white male with suspicious lung cancer  HPI [name] is a 70 y.o. male with past medical history significant for hypertension, diabetes mellitus, dyslipidemia as well as history of inguinal hernia repair and long history of smoking.  The patient was complaining of left-sided chest pain as well as shortness of breath for few days.  His wife called 23 and the patient was taken to the emergency department at [facility] for evaluation and to rule out cardiac event.  During his evaluation he had CT angiogram of the chest on 09/04/2021 and it showed spiculated left upper lobe pulmonary mass measuring 3.1 x 2.5 x 2.2 cm the margins are spiculated and lobulated.  The spiculation extends peripherally to the pleural surface posteriorly to the fissure.  There was very fissural 0.5 cm nodule abutting the minor fissure on the right.  The mediastinum showed few subcentimeter lymph nodes including 0.9 cm left lower paratracheal node, 0.8 cm left hilar node, 0.8 cm right hilar node, 0.7 cm right supraclavicular node. The patient was referred to me today for evaluation and recommendation regarding his condition. When seen today he is feeling fine except for mild cough and the left-sided chest pain.  He is currently on meloxicam with no improvement.  He denied having any shortness of breath or hemoptysis.  He denied having any weight loss or night sweats.  He has no nausea, vomiting, diarrhea or constipation.  He has no headache or visual changes. Family history, mother had mental illness after she lost her younger son and father has unknown medical history. The patient is married and has 3 adult children and they live in [place].  He was accompanied today by his wife [name].  The patient works in [employer].  He has a history of  smoking less than 1 pack/day for around 47 years and quit smoking 2 weeks ago.  He drinks alcohol regularly and no history of drug abuse.  HPI  Past Medical History:  Diagnosis Date   Diabetes mellitus ([place])    Dyslipidemia    Hypertension     Past Surgical History:  Procedure Laterality Date   INGUINAL HERNIA REPAIR      Family History  Problem Relation Age of Onset   Mental illness Mother     Social History Social History   Tobacco Use   Smoking status: Former    Packs/day: 1.00    Years: 47.00    Pack years: 47.00    Types: Cigarettes    Quit date: 08/29/2021    Years since quitting: 0.0   Smokeless tobacco: Never  Substance Use Topics   Alcohol use: Yes    Alcohol/week: 2.0 standard drinks    Types: 2 Glasses of wine per week   Drug use: Never    No Known Allergies  Current Outpatient Medications  Medication Sig Dispense Refill   atorvastatin (LIPITOR) 10 MG tablet Take 10 mg by mouth daily. Dose is unknown     famotidine (PEPCID) 20 MG tablet Take 20 mg by mouth 2 (two) times daily.     losartan (COZAAR) 25 MG tablet Take 25 mg by mouth daily. Dose unknown     meloxicam (MOBIC) 7.5 MG tablet Take 1 tablet (7.5 mg total) by mouth 2 (two) times daily as needed for up to 14 days  for pain. 28 tablet 0   metFORMIN (GLUCOPHAGE) 500 MG tablet Take by mouth 2 (two) times daily with a meal. Dose unknown     No current facility-administered medications for this visit.    Review of Systems  Constitutional: negative Eyes: negative Ears, nose, mouth, throat, and face: negative Respiratory: positive for cough and pleurisy/chest pain Cardiovascular: negative Gastrointestinal: negative Genitourinary:negative Integument/breast: negative Hematologic/lymphatic: negative Musculoskeletal:negative Neurological: negative Behavioral/Psych: negative Endocrine: negative Allergic/Immunologic: negative  Physical Exam  ASN:KNLZJ, healthy, no distress, well nourished,  well developed, and anxious SKIN: skin color, texture, turgor are normal, no rashes or significant lesions HEAD: Normocephalic, No masses, lesions, tenderness or abnormalities EYES: normal, PERRLA, Conjunctiva are pink and non-injected EARS: External ears normal, Canals clear OROPHARYNX:no exudate, no erythema, and lips, buccal mucosa, and tongue normal  NECK: supple, no adenopathy, no JVD LYMPH:  no palpable lymphadenopathy, no hepatosplenomegaly LUNGS: clear to auscultation , and palpation HEART: regular rate & rhythm, no murmurs, and no gallops ABDOMEN:abdomen soft, non-tender, normal bowel sounds, and no masses or organomegaly BACK: Back symmetric, no curvature., No CVA tenderness EXTREMITIES:no joint deformities, effusion, or inflammation, no edema  NEURO: alert & oriented x 3 with fluent speech, no focal motor/sensory deficits  PERFORMANCE STATUS: ECOG 1  LABORATORY DATA: Lab Results  Component Value Date   WBC 8.5 09/12/2021   HGB 13.7 09/12/2021   HCT 39.8 09/12/2021   MCV 93.6 09/12/2021   PLT 190 09/12/2021      Chemistry      Component Value Date/Time   NA 139 09/12/2021 1350   K 4.5 09/12/2021 1350   CL 106 09/12/2021 1350   CO2 22 09/12/2021 1350   BUN 27 (H) 09/12/2021 1350   CREATININE 1.74 (H) 09/12/2021 1350      Component Value Date/Time   CALCIUM 9.3 09/12/2021 1350   ALKPHOS 66 09/12/2021 1350   AST 19 09/12/2021 1350   ALT 15 09/12/2021 1350   BILITOT 0.6 09/12/2021 1350       RADIOGRAPHIC STUDIES: DG Chest 2 View  Result Date: 09/04/2021 CLINICAL DATA:  Chest pain. EXAM: CHEST - 2 VIEW COMPARISON:  08/25/2013 FINDINGS: Heart size and mediastinal contours appear normal. No pleural effusion. Left upper lobe pulmonary nodule is identified which measures 2.5 cm and is new from previous exam. The right lung appears clear. Visualized osseous structures are unremarkable. IMPRESSION: 1. No acute cardiopulmonary abnormalities. 2. Left upper lobe  pulmonary nodule. Recommend further evaluation with contrast enhanced CT of the chest. Electronically Signed   By: Kerby Moors M.D.   On: 09/04/2021 09:57   CT Angio Chest PE W and/or Wo Contrast  Result Date: 09/04/2021 CLINICAL DATA:  PE suspected, high prob has left upper lobe nodule and risk for PE - L sided CP / SOB EXAM: CT ANGIOGRAPHY CHEST WITH CONTRAST TECHNIQUE: Multidetector CT imaging of the chest was performed using the standard protocol during bolus administration of intravenous contrast. Multiplanar CT image reconstructions and MIPs were obtained to evaluate the vascular anatomy. CONTRAST:  72mL OMNIPAQUE IOHEXOL 350 MG/ML SOLN COMPARISON:  Radiograph earlier today. FINDINGS: Cardiovascular: Excellent opacification of the pulmonary arteries. There are no filling defects within the pulmonary arteries to suggest pulmonary embolus. Atherosclerosis of the thoracic aorta. 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 Can not assess for dissection given phase of contrast tailored to pulmonary artery evaluation. There is no periaortic stranding. Coronary artery calcifications. Upper normal heart  size. No pericardial effusion. Mediastinum/Nodes: 9 mm left lower paratracheal node, series 5, image 57. Increased number of multiple additional small mediastinal lymph nodes. 8 mm left hilar node, series 5, image 68. There is also an 8 mm right hilar node, series 5, image 66. 7 mm right supraclavicular node, series 5, image 16. No axillary adenopathy. No visualized thyroid nodule. No esophageal wall thickening. Lungs/Pleura: Spiculated left upper lobe pulmonary nodule measures 3.1 x 2.5 x 2.2 cm. Margins are spiculated and lobulated. Spiculations extending peripherally to the pleural surface, posteriorly to the fissure. Perifissural 5 mm nodule abutting the minor fissure on the right, series 6, image 95. There is mild biapical  pleuroparenchymal scarring. Mild emphysema and lower lobe bronchial thickening. The trachea and central bronchi are patent. No pleural fluid. Upper Abdomen: No acute upper abdominal findings. Colonic diverticulosis at the splenic flexure. No visualized adrenal nodule, although the adrenal glands are not entirely included in the field of view. Musculoskeletal: Flowing anterior osteophytes throughout the thoracic spine. Bone marrow slightly heterogeneous but no destructive lytic or focal blastic osseous lesions. Review of the MIP images confirms the above findings. IMPRESSION: 1. No pulmonary embolus. 2. Spiculated left upper lobe pulmonary nodule measuring 3.1 x 2.5 x 2.2 cm, highly suspicious for primary bronchogenic malignancy. 3. Small mediastinal and bilateral hilar lymph nodes are nonspecific in this setting. There is also a prominent right supraclavicular node. 4. 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 Recommend annual imaging followup by CTA or MRA. This recommendation follows 2010 ACCF/AHA/AATS/ACR/ASA/SCA/SCAI/SIR/STS/SVM Guidelines for the Diagnosis and Management of Patients with Thoracic Aortic Disease. Circulation. 2010; 121: F751-W258. Aortic aneurysm NOS (ICD10-I71.9) 5. Mild emphysema and bronchial thickening. 6. Coronary artery calcifications and aortic atherosclerosis. Aortic Atherosclerosis (ICD10-I70.0) and Emphysema (ICD10-J43.9). Electronically Signed   By: Keith Rake M.D.   On: 09/04/2021 20:37    ASSESSMENT: This is a very pleasant 70 years old white male with highly suspicious stage Ib (T2a, N0, M0) lung cancer likely to be non-small cell carcinoma pending tissue diagnosis and further staging work-up.  He presented with spiculated left upper lobe pulmonary nodule.   PLAN: I had a lengthy discussion with the patient and his wife today about his current disease stage, prognosis and further investigation to confirm his diagnosis. I  personally and independently reviewed the scan images and discussed the result and showed the images to the patient and his wife. I recommended for the patient to have a PET scan as well as MRI of the brain to rule out any other metastatic disease. If the PET scan showed a hypermetabolic left lung pulmonary nodule with no other evidence of metastatic disease, would consider referring the patient to cardiothoracic surgery for surgical resection with or without a biopsy. I will see him back for follow-up visit in around 2 weeks for evaluation and more detailed discussion of his treatment options based on the final staging work-up. For pain management I gave the patient prescription for Percocet 5/325 mg p.o. every 8 hours as needed for pain.  He was also advised to use Tylenol and occasional ibuprofen if needed. He was advised to call immediately if he has any other concerning symptoms in the interval.  The patient voices understanding of current disease status and treatment options and is in agreement with the current care plan.  All questions were answered. The patient knows to call the clinic with any problems, questions or concerns. We can certainly see the patient much  sooner if necessary.  Thank you so much for allowing me to participate in the care of [name]. I will continue to follow up the patient with you and assist in his care.  The total time spent in the appointment was 60 minutes.  Disclaimer: This note was dictated with voice recognition software. Similar sounding words can inadvertently be transcribed and may not be corrected upon review.   Eilleen Kempf September 12, 2021, 2:43 PM

## 2021-09-13 ENCOUNTER — Telehealth: Payer: Self-pay | Admitting: *Deleted

## 2021-09-13 NOTE — Telephone Encounter (Signed)
Per Dr. Julien Nordmann, patient needs PET and MRI brain. I called radiology and received appts for these scans. I called patient with an update.  I also updated on pre-procedure instructions for PET scan.  He verbalized understanding of appts. He also states he is not getting any pain relieve and asked if I could update Dr. Julien Nordmann for something else. I will update Dr. Julien Nordmann.

## 2021-09-13 NOTE — Telephone Encounter (Signed)
I updated Dr. Julien Nordmann regarding his pain.  I received an update to call patient and have him use lidocaine patches as well as pain medication.

## 2021-09-20 ENCOUNTER — Encounter: Payer: Self-pay | Admitting: Internal Medicine

## 2021-09-20 ENCOUNTER — Other Ambulatory Visit: Payer: Self-pay

## 2021-09-20 ENCOUNTER — Ambulatory Visit (HOSPITAL_COMMUNITY)
Admission: RE | Admit: 2021-09-20 | Discharge: 2021-09-20 | Disposition: A | Discharge status: Home / Self Care | Payer: Medicare Other | Source: Ambulatory Visit | Attending: Internal Medicine | Admitting: Internal Medicine

## 2021-09-20 DIAGNOSIS — C3490 Malignant neoplasm of unspecified part of unspecified bronchus or lung: Secondary | ICD-10-CM | POA: Diagnosis not present

## 2021-09-20 MED ORDER — GADOBUTROL 1 MMOL/ML IV SOLN
10.0000 mL | Freq: Once | INTRAVENOUS | Status: AC | PRN
  Administered 2021-09-20: 10 mL via INTRAVENOUS

## 2021-09-21 ENCOUNTER — Other Ambulatory Visit: Payer: Self-pay | Admitting: Physician Assistant

## 2021-09-21 DIAGNOSIS — G893 Neoplasm related pain (acute) (chronic): Secondary | ICD-10-CM

## 2021-09-21 MED ORDER — OXYCODONE-ACETAMINOPHEN 5-325 MG PO TABS: 1.0000 | ORAL_TABLET | Freq: Four times a day (QID) | ORAL | 0 refills | Status: DC | PRN

## 2021-09-27 ENCOUNTER — Ambulatory Visit (HOSPITAL_COMMUNITY)
Admission: RE | Admit: 2021-09-27 | Discharge: 2021-09-27 | Disposition: A | Discharge status: Home / Self Care | Payer: Medicare Other | Source: Ambulatory Visit | Attending: Internal Medicine | Admitting: Internal Medicine

## 2021-09-27 DIAGNOSIS — X58XXXA Exposure to other specified factors, initial encounter: Secondary | ICD-10-CM | POA: Insufficient documentation

## 2021-09-27 DIAGNOSIS — I700 Atherosclerosis of aorta: Secondary | ICD-10-CM | POA: Insufficient documentation

## 2021-09-27 DIAGNOSIS — I2510 Atherosclerotic heart disease of native coronary artery without angina pectoris: Secondary | ICD-10-CM | POA: Diagnosis not present

## 2021-09-27 DIAGNOSIS — C3490 Malignant neoplasm of unspecified part of unspecified bronchus or lung: Secondary | ICD-10-CM | POA: Insufficient documentation

## 2021-09-27 DIAGNOSIS — S2242XA Multiple fractures of ribs, left side, initial encounter for closed fracture: Secondary | ICD-10-CM | POA: Insufficient documentation

## 2021-09-27 LAB — GLUCOSE, CAPILLARY: Glucose-Capillary: 149 mg/dL — ABNORMAL HIGH (ref 70–99)

## 2021-09-27 MED ORDER — FLUDEOXYGLUCOSE F - 18 (FDG) INJECTION
10.0000 | Freq: Once | INTRAVENOUS | Status: AC | PRN
  Administered 2021-09-27: 10 via INTRAVENOUS

## 2021-09-27 NOTE — Progress Notes (Signed)
[place] OFFICE PROGRESS NOTE  Associates, [facility] [place] [id number]  DIAGNOSIS: With highly suspicious stage I (T2a, N0, M0/M1c) lung cancer likely to be  lung cancer likely to be non-small cell carcinoma pending tissue diagnosis.  He presented with a left upper lobe pulmonary nodule concerning for primary lung malignancy. There is also mild uptake in the T5 vertebral body which may be post-traumatic but close monitoring is warranted.   PRIOR THERAPY: None   CURRENT THERAPY: None   INTERVAL HISTORY: [name] 71 y.o. male returns to the clinic today for a follow-up visit accompanied by his wife.  The patient was recently found to have suspicious lung cancer, pending further staging work-up.  He was first seen in the clinic on 09/12/2021.  Since that time, he completed the staging work-up with a PET scan and a brain MRI.  He is here today for evaluation to review his scan results.  Overall, the patient is feeling fine except for a mild cough and left-sided chest pain for which he was prescribed meloxicam in the emergency room with no improvement.  He also takes Percocet 1-3 times a day.  The patient denies any recent falls, traumas, or injuries.  The scan notes some left-sided rib fractures.  The patient does have some large dogs and is unclear if the rib fractures are secondary to his dogs.  He denies any shortness of breath or hemoptysis.  Denies any fevers, chills, or weight loss.  Denies any nausea, vomiting, diarrhea, or constipation.  Denies any headache or visual changes.  He is here today for evaluation to review his scan results.    MEDICAL HISTORY: Past Medical History:  Diagnosis Date   Diabetes mellitus ([place])    Dyslipidemia    Hypertension     ALLERGIES:  has No Known Allergies.  MEDICATIONS:  Current Outpatient Medications  Medication Sig Dispense Refill   atenolol (TENORMIN) 100 MG tablet Take 100 mg by mouth  daily.     atorvastatin (LIPITOR) 10 MG tablet Take 10 mg by mouth daily. Dose is unknown     atorvastatin (LIPITOR) 20 MG tablet Take 20 mg by mouth daily.     famotidine (PEPCID) 20 MG tablet Take 20 mg by mouth 2 (two) times daily.     fluconazole (DIFLUCAN) 100 MG tablet Take 100 mg by mouth daily.     losartan (COZAAR) 25 MG tablet Take 25 mg by mouth daily. Dose unknown     losartan-hydrochlorothiazide (HYZAAR) 100-12.5 MG tablet Take 1 tablet by mouth daily.     metFORMIN (GLUCOPHAGE) 500 MG tablet Take by mouth 2 (two) times daily with a meal. Dose unknown     metFORMIN (GLUCOPHAGE-XR) 500 MG 24 hr tablet Take 500 mg by mouth 2 (two) times daily.     oxyCODONE-acetaminophen (PERCOCET/ROXICET) 5-325 MG tablet Take 1 tablet by mouth every 6 (six) hours as needed for severe pain. 40 tablet 0   No current facility-administered medications for this visit.    SURGICAL HISTORY:  Past Surgical History:  Procedure Laterality Date   INGUINAL HERNIA REPAIR      REVIEW OF SYSTEMS:   Review of Systems  Constitutional: Negative for appetite change, chills, fatigue, fever and unexpected weight change.  HENT:   Negative for mouth sores, nosebleeds, sore throat and trouble swallowing.   Eyes: Negative for eye problems and icterus.  Respiratory: Positive for mild cough.  Negative for hemoptysis, shortness of breath and wheezing.  Cardiovascular: Positive for left-sided rib pain.  Negative for leg swelling.  Gastrointestinal: Negative for abdominal pain, constipation, diarrhea, nausea and vomiting.  Genitourinary: Negative for bladder incontinence, difficulty urinating, dysuria, frequency and hematuria.   Musculoskeletal: Negative for back pain, gait problem, neck pain and neck stiffness.  Skin: Negative for itching and rash.  Neurological: Negative for dizziness, extremity weakness, gait problem, headaches, light-headedness and seizures.  Hematological: Negative for adenopathy. Does not  bruise/bleed easily.  Psychiatric/Behavioral: Negative for confusion, depression and sleep disturbance. The patient is not nervous/anxious.     PHYSICAL EXAMINATION:  Blood pressure 136/79, pulse 63, temperature 97.9 F (36.6 C), resp. rate 20, weight 194 lb 1.6 oz (88 kg), SpO2 99 %.  ECOG PERFORMANCE STATUS: 1  Physical Exam  Constitutional: Oriented to person, place, and time and well-developed, well-nourished, and in no distress.  HENT:  Head: Normocephalic and atraumatic.  Mouth/Throat: Oropharynx is clear and moist. No oropharyngeal exudate.  Eyes: Conjunctivae are normal. Right eye exhibits no discharge. Left eye exhibits no discharge. No scleral icterus.  Neck: Normal range of motion. Neck supple.  Cardiovascular: Normal rate, regular rhythm, normal heart sounds and intact distal pulses.   Pulmonary/Chest: Effort normal and breath sounds normal. No respiratory distress. No wheezes. No rales.  Abdominal: Soft. Bowel sounds are normal. Exhibits no distension and no mass. There is no tenderness.  Musculoskeletal: Normal range of motion. Exhibits no edema.  Lymphadenopathy:    No cervical adenopathy.  Neurological: Alert and oriented to person, place, and time. Exhibits normal muscle tone. Gait normal. Coordination normal.  Skin: Skin is warm and dry. No rash noted. Not diaphoretic. No erythema. No pallor.  Psychiatric: Mood, memory and judgment normal.  Vitals reviewed.  LABORATORY DATA: Lab Results  Component Value Date   WBC 8.5 09/12/2021   HGB 13.7 09/12/2021   HCT 39.8 09/12/2021   MCV 93.6 09/12/2021   PLT 190 09/12/2021      Chemistry      Component Value Date/Time   NA 139 09/12/2021 1350   K 4.5 09/12/2021 1350   CL 106 09/12/2021 1350   CO2 22 09/12/2021 1350   BUN 27 (H) 09/12/2021 1350   CREATININE 1.74 (H) 09/12/2021 1350      Component Value Date/Time   CALCIUM 9.3 09/12/2021 1350   ALKPHOS 66 09/12/2021 1350   AST 19 09/12/2021 1350   ALT 15  09/12/2021 1350   BILITOT 0.6 09/12/2021 1350       RADIOGRAPHIC STUDIES:  DG Chest 2 View  Result Date: 09/04/2021 CLINICAL DATA:  Chest pain. EXAM: CHEST - 2 VIEW COMPARISON:  08/25/2013 FINDINGS: Heart size and mediastinal contours appear normal. No pleural effusion. Left upper lobe pulmonary nodule is identified which measures 2.5 cm and is new from previous exam. The right lung appears clear. Visualized osseous structures are unremarkable. IMPRESSION: 1. No acute cardiopulmonary abnormalities. 2. Left upper lobe pulmonary nodule. Recommend further evaluation with contrast enhanced CT of the chest. Electronically Signed   By: Kerby Moors M.D.   On: 09/04/2021 09:57   CT Angio Chest PE W and/or Wo Contrast  Result Date: 09/04/2021 CLINICAL DATA:  PE suspected, high prob has left upper lobe nodule and risk for PE - L sided CP / SOB EXAM: CT ANGIOGRAPHY CHEST WITH CONTRAST TECHNIQUE: Multidetector CT imaging of the chest was performed using the standard protocol during bolus administration of intravenous contrast. Multiplanar CT image reconstructions and MIPs were obtained to evaluate the vascular anatomy. CONTRAST:  85mL OMNIPAQUE IOHEXOL 350 MG/ML SOLN COMPARISON:  Radiograph earlier today. FINDINGS: Cardiovascular: Excellent opacification of the pulmonary arteries. There are no filling defects within the pulmonary arteries to suggest pulmonary embolus. Atherosclerosis of the thoracic aorta. 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 Can not assess for dissection given phase of contrast tailored to pulmonary artery evaluation. There is no periaortic stranding. Coronary artery calcifications. Upper normal heart size. No pericardial effusion. Mediastinum/Nodes: 9 mm left lower paratracheal node, series 5, image 57. Increased number of multiple additional small mediastinal lymph nodes. 8 mm left hilar node, series 5, image  68. There is also an 8 mm right hilar node, series 5, image 66. 7 mm right supraclavicular node, series 5, image 16. No axillary adenopathy. No visualized thyroid nodule. No esophageal wall thickening. Lungs/Pleura: Spiculated left upper lobe pulmonary nodule measures 3.1 x 2.5 x 2.2 cm. Margins are spiculated and lobulated. Spiculations extending peripherally to the pleural surface, posteriorly to the fissure. Perifissural 5 mm nodule abutting the minor fissure on the right, series 6, image 95. There is mild biapical pleuroparenchymal scarring. Mild emphysema and lower lobe bronchial thickening. The trachea and central bronchi are patent. No pleural fluid. Upper Abdomen: No acute upper abdominal findings. Colonic diverticulosis at the splenic flexure. No visualized adrenal nodule, although the adrenal glands are not entirely included in the field of view. Musculoskeletal: Flowing anterior osteophytes throughout the thoracic spine. Bone marrow slightly heterogeneous but no destructive lytic or focal blastic osseous lesions. Review of the MIP images confirms the above findings. IMPRESSION: 1. No pulmonary embolus. 2. Spiculated left upper lobe pulmonary nodule measuring 3.1 x 2.5 x 2.2 cm, highly suspicious for primary bronchogenic malignancy. 3. Small mediastinal and bilateral hilar lymph nodes are nonspecific in this setting. There is also a prominent right supraclavicular node. 4. 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 Recommend annual imaging followup by CTA or MRA. This recommendation follows 2010 ACCF/AHA/AATS/ACR/ASA/SCA/SCAI/SIR/STS/SVM Guidelines for the Diagnosis and Management of Patients with Thoracic Aortic Disease. Circulation. 2010; 121: F027-X412. Aortic aneurysm NOS (ICD10-I71.9) 5. Mild emphysema and bronchial thickening. 6. Coronary artery calcifications and aortic atherosclerosis. Aortic Atherosclerosis (ICD10-I70.0) and Emphysema (ICD10-J43.9).  Electronically Signed   By: Keith Rake M.D.   On: 09/04/2021 20:37   MR BRAIN W WO CONTRAST  Result Date: 09/22/2021 CLINICAL DATA:  Non-small cell lung cancer staging EXAM: MRI HEAD WITHOUT AND WITH CONTRAST TECHNIQUE: Multiplanar, multiecho pulse sequences of the brain and surrounding structures were obtained without and with intravenous contrast. CONTRAST:  90mL GADAVIST GADOBUTROL 1 MMOL/ML IV SOLN COMPARISON:  None. FINDINGS: Brain: No mass or swelling to suggest metastatic disease. No recent infarction, hemorrhage, hydrocephalus, extra-axial collection or mass lesion. Moderate chronic small vessel ischemia. Mild brain atrophy Vascular: Normal flow voids. Skull and upper cervical spine: Normal marrow signal. Sinuses/Orbits: Negative. IMPRESSION: Negative for metastatic disease. Electronically Signed   By: Jorje Guild M.D.   On: 09/22/2021 14:48   NM PET Image Initial (PI) Skull Base To Thigh (F-18 FDG)  Result Date: 09/28/2021 CLINICAL DATA:  Initial treatment strategy for pulmonary nodule. EXAM: NUCLEAR MEDICINE PET SKULL BASE TO THIGH TECHNIQUE: 10.0 mCi F-18 FDG was injected intravenously. Full-ring PET imaging was performed from the skull base to thigh after the radiotracer. CT data was obtained and used for attenuation correction and anatomic localization. Fasting blood glucose: 149 mg/dl COMPARISON:  Chest CT dated September 04, 2021 FINDINGS: Mediastinal blood pool activity: SUV max 2.4 Liver activity: SUV max 3.7 NECK: No hypermetabolic lymph nodes in the neck. Incidental CT findings: none CHEST: Spiculated mass of the left upper lobe which is unchanged in size compared to prior exam and demonstrates hypermetabolic activity, SUV max of 8.6. Right supraclavicular lymph node measuring 6 mm in short axis on series 4, image 58 with SUV max of 2.5, similar to mediastinal blood pool. No hypermetabolic lymph nodes seen in the chest. Incidental CT findings: Mild cardiomegaly. Atherosclerotic  disease of the thoracic aorta. Coronary artery calcifications of the RCA and LAD. Unchanged dilation of the ascending thoracic aorta, measuring up to 4.0 cm. ABDOMEN/PELVIS: No abnormal hypermetabolic activity within the liver, pancreas, adrenal glands, or spleen. No hypermetabolic lymph nodes in the abdomen or pelvis. Incidental CT findings: Low-attenuation exophytic lesion of the left kidney, likely a simple cyst. Atherosclerotic disease of the abdominal aorta. Scattered colonic diverticula. SKELETON: Focal FDG uptake at the T5 vertebral body with an SUV max of 4.4. Mild focal FDG uptake of the lateral 8th rib with subtle associated sclerosis on CT, SUV max of 2.8. Incidental CT findings: Posterior left rib fractures with associated FDG uptake, no findings to suggest pathologic fractures. IMPRESSION: Hypermetabolic left upper lobe pulmonary nodule, concerning for primary lung malignancy. No evidence of visceral metastatic disease in the chest, abdomen or pelvis. Multiple posterior left rib fractures which are new compared to prior CT and demonstrate associated FDG uptake. No finding to suggest pathologic fracture. Mild focal FDG uptake at the T5 vertebral body and lateral eighth rib, potentially post traumatic given evidence of interval trauma, although metastatic disease cannot be excluded. Dedicated small field of view imaging of the thoracic spine could be useful for further evaluation of T5 lesion. Alternatively, recommend attention on follow-up. Electronically Signed   By: Yetta Glassman M.D.   On: 09/28/2021 16:41     ASSESSMENT/PLAN:  This is a very pleasant 70 year old Caucasian male with suspicious stage I  (T2a, N0, M0/M) lung cancer pending tissue diagnosis.  He presented with a left upper lobe lung nodule concerning for malignancy.  There is also mild uptake in the T5 vertebral body and lateral eighth rib which may be posttraumatic although metastatic disease cannot be excluded and warrants close  monitoring.Marland Kitchen  He was diagnosed in October 2022.  The patient had a staging brain MRI and PET scan performed.  The patient was seen with Dr. Julien Nordmann today.  Dr. Julien Nordmann personally and independently reviewed the scan results and discussed the results with the patient today.  The scan showed hypermetabolic left upper lobe pulmonary nodule concerning for primary malignancy.  There was no evidence of visceral metastatic disease in the chest, abdomen, and pelvis.  There was multiple posterior left rib fractures but no findings to suggest pathologic fracture.  There is also mild focal FDG uptake in the T5 vertebral body and lateral eighth rib.  Radiologist this is likely posttraumatic given the evidence of internal trauma although metastatic disease cannot be excluded and they recommend a follow-up imaging or close monitoring.  The patient was seen with Dr. Julien Nordmann.  Dr. Julien Nordmann recommends that we refer the patient to cardiothoracic surgery for consideration of surgical resection.  I have placed the referral.  We will see the patient back for follow-up visit in 6 weeks for evaluation.   We will refill the patient's Percocet today; however, he will need to follow-up with his family doctor for pain management for his rib fracture  in the future after this refill and with his cardiothoracic surgeon for postop pain control.   The patient was advised to call immediately if he has any concerning symptoms in the interval. The patient voices understanding of current disease status and treatment options and is in agreement with the current care plan. All questions were answered. The patient knows to call the clinic with any problems, questions or concerns. We can certainly see the patient much sooner if necessary   Orders Placed This Encounter  Procedures   Ambulatory referral to Cardiothoracic Surgery    Referral Priority:   Routine    Referral Type:   Surgical    Referral Reason:   Specialty Services Required     Requested Specialty:   Cardiothoracic Surgery    Number of Visits Requested:   [place], PA-C 10/01/21  ADDENDUM: Hematology/Oncology Attending: I had a face-to-face encounter with the patient today.  I reviewed his record, labs and scans and recommended his care plan.  This is a pleasant 70 years old white male with likely stage Ib (22A, N0, M0) lung cancer pending tissue diagnosis and presented with left upper lobe pulmonary nodule. The patient had a PET scan as well as MRI of the brain performed recently.  I personally and independently reviewed the scan images and discussed the result and showed the images to the patient and his wife. His scan showed no concerning findings for disease progression except for left rib fractures that looks like traumatic in nature.  He also has a suspicious lesion in the T5 also suspicious to be traumatic lesion.  He has no mediastinal lymphadenopathy or other extrathoracic metastatic disease.  MRI of the brain was negative for malignancy. The patient continues to have pain on the left side of the chest from the left rib fractures. He is currently on Percocet on as-needed basis. I had a lengthy discussion with the patient and his wife about his current condition and treatment options. I recommended for the patient to see cardiothoracic surgery for consideration of surgical resection with or without biopsy before the surgery. We will arrange for the patient to have pulmonary function test. We will see him back for follow-up visit in around 6 weeks for evaluation and discussion of any additional treatment options if needed after the surgery. We will give the patient refill of his Percocet for now but once his surgery is done he may be able to use anti-inflammatory medications for his rib pain. The patient was advised to call immediately if he has any other concerning symptoms in the interval. The total time spent in the appointment was  35 minutes. Disclaimer: This note was dictated with voice recognition software. Similar sounding words can inadvertently be transcribed and may be missed upon review. Eilleen Kempf, MD 10/01/21

## 2021-10-01 ENCOUNTER — Inpatient Hospital Stay: Payer: Medicare Other | Attending: Internal Medicine | Admitting: Physician Assistant

## 2021-10-01 ENCOUNTER — Other Ambulatory Visit: Payer: Self-pay

## 2021-10-01 ENCOUNTER — Inpatient Hospital Stay: Payer: Medicare Other

## 2021-10-01 VITALS — BP 136/79 | HR 63 | Temp 97.9°F | Resp 20 | Wt 194.1 lb

## 2021-10-01 DIAGNOSIS — Z79899 Other long term (current) drug therapy: Secondary | ICD-10-CM | POA: Insufficient documentation

## 2021-10-01 DIAGNOSIS — G893 Neoplasm related pain (acute) (chronic): Secondary | ICD-10-CM

## 2021-10-01 DIAGNOSIS — R911 Solitary pulmonary nodule: Secondary | ICD-10-CM | POA: Diagnosis present

## 2021-10-01 DIAGNOSIS — Z7984 Long term (current) use of oral hypoglycemic drugs: Secondary | ICD-10-CM | POA: Diagnosis not present

## 2021-10-01 DIAGNOSIS — S2242XD Multiple fractures of ribs, left side, subsequent encounter for fracture with routine healing: Secondary | ICD-10-CM | POA: Diagnosis not present

## 2021-10-01 MED ORDER — OXYCODONE-ACETAMINOPHEN 5-325 MG PO TABS: 1.0000 | ORAL_TABLET | Freq: Four times a day (QID) | ORAL | 0 refills | Status: DC | PRN

## 2021-10-02 ENCOUNTER — Encounter: Payer: Self-pay | Admitting: Medical Oncology

## 2021-10-02 ENCOUNTER — Telehealth: Payer: Self-pay | Admitting: Internal Medicine

## 2021-10-02 NOTE — Telephone Encounter (Signed)
Sch per 10/17 [place]

## 2021-10-10 ENCOUNTER — Other Ambulatory Visit: Payer: Self-pay | Admitting: *Deleted

## 2021-10-10 DIAGNOSIS — R911 Solitary pulmonary nodule: Secondary | ICD-10-CM

## 2021-10-10 NOTE — Progress Notes (Unsigned)
[address]       ,[place] [address]             [phone]                    [name] Medical Record #[id number] Date of Birth: 1951-04-19  Referring: Heilingoetter, Architectural technologist* Primary Care: [facility] Primary Cardiologist: None  Chief Complaint:   No chief complaint on file.   History of Present Illness:    [name] 70 y.o. male referred by Dr. Earlie Server for surgical evaluation of a 3.1 cm PET avid left upper lobe pulmonary mass.    Smoking Hx: ***   Zubrod Score: At the time of surgery this patient's most appropriate activity status/level should be described as: []     0    Normal activity, no symptoms []     1    Restricted in physical strenuous activity but ambulatory, able to do out light work []     2    Ambulatory and capable of self care, unable to do work activities, up and about               >50 % of waking hours                              []     3    Only limited self care, in bed greater than 50% of waking hours []     4    Completely disabled, no self care, confined to bed or chair []     5    Moribund   Past Medical History:  Diagnosis Date   Diabetes mellitus ([place])    Dyslipidemia    Hypertension     Past Surgical History:  Procedure Laterality Date   INGUINAL HERNIA REPAIR      Family History  Problem Relation Age of Onset   Mental illness Mother      Social History   Tobacco Use  Smoking Status Former   Packs/day: 1.00   Years: 47.00   Pack years: 47.00   Types: Cigarettes   Quit date: 08/29/2021   Years since quitting: 0.1  Smokeless Tobacco Never    Social History   Substance and Sexual Activity  Alcohol Use Yes   Alcohol/week: 2.0 standard drinks   Types: 2 Glasses of wine per week     No Known Allergies  Current Outpatient Medications  Medication Sig Dispense Refill   atenolol (TENORMIN) 100 MG tablet Take 100 mg by mouth daily.      atorvastatin (LIPITOR) 10 MG tablet Take 10 mg by mouth daily. Dose is unknown     atorvastatin (LIPITOR) 20 MG tablet Take 20 mg by mouth daily.     famotidine (PEPCID) 20 MG tablet Take 20 mg by mouth 2 (two) times daily.     fluconazole (DIFLUCAN) 100 MG tablet Take 100 mg by mouth daily.     losartan (COZAAR) 25 MG tablet Take 25 mg by mouth daily. Dose unknown     losartan-hydrochlorothiazide (HYZAAR) 100-12.5 MG tablet Take 1 tablet by mouth daily.     metFORMIN (GLUCOPHAGE) 500 MG tablet Take by mouth 2 (two) times daily with a meal. Dose unknown     metFORMIN (GLUCOPHAGE-XR) 500 MG 24 hr tablet Take 500 mg by mouth 2 (two) times daily.     oxyCODONE-acetaminophen (PERCOCET/ROXICET) 5-325 MG  tablet Take 1 tablet by mouth every 6 (six) hours as needed for severe pain. 40 tablet 0   No current facility-administered medications for this visit.    ROS   PHYSICAL EXAMINATION: There were no vitals taken for this visit. Physical Exam  Diagnostic Studies & Laboratory data:     Recent Radiology Findings:   MR BRAIN W WO CONTRAST  Result Date: 09/22/2021 CLINICAL DATA:  Non-small cell lung cancer staging EXAM: MRI HEAD WITHOUT AND WITH CONTRAST TECHNIQUE: Multiplanar, multiecho pulse sequences of the brain and surrounding structures were obtained without and with intravenous contrast. CONTRAST:  69mL GADAVIST GADOBUTROL 1 MMOL/ML IV SOLN COMPARISON:  None. FINDINGS: Brain: No mass or swelling to suggest metastatic disease. No recent infarction, hemorrhage, hydrocephalus, extra-axial collection or mass lesion. Moderate chronic small vessel ischemia. Mild brain atrophy Vascular: Normal flow voids. Skull and upper cervical spine: Normal marrow signal. Sinuses/Orbits: Negative. IMPRESSION: Negative for metastatic disease. Electronically Signed   By: Jorje Guild M.D.   On: 09/22/2021 14:48   NM PET Image Initial (PI) Skull Base To Thigh (F-18 FDG)  Result Date: 09/28/2021 CLINICAL DATA:   Initial treatment strategy for pulmonary nodule. EXAM: NUCLEAR MEDICINE PET SKULL BASE TO THIGH TECHNIQUE: 10.0 mCi F-18 FDG was injected intravenously. Full-ring PET imaging was performed from the skull base to thigh after the radiotracer. CT data was obtained and used for attenuation correction and anatomic localization. Fasting blood glucose: 149 mg/dl COMPARISON:  Chest CT dated September 04, 2021 FINDINGS: Mediastinal blood pool activity: SUV max 2.4 Liver activity: SUV max 3.7 NECK: No hypermetabolic lymph nodes in the neck. Incidental CT findings: none CHEST: Spiculated mass of the left upper lobe which is unchanged in size compared to prior exam and demonstrates hypermetabolic activity, SUV max of 8.6. Right supraclavicular lymph node measuring 6 mm in short axis on series 4, image 58 with SUV max of 2.5, similar to mediastinal blood pool. No hypermetabolic lymph nodes seen in the chest. Incidental CT findings: Mild cardiomegaly. Atherosclerotic disease of the thoracic aorta. Coronary artery calcifications of the RCA and LAD. Unchanged dilation of the ascending thoracic aorta, measuring up to 4.0 cm. ABDOMEN/PELVIS: No abnormal hypermetabolic activity within the liver, pancreas, adrenal glands, or spleen. No hypermetabolic lymph nodes in the abdomen or pelvis. Incidental CT findings: Low-attenuation exophytic lesion of the left kidney, likely a simple cyst. Atherosclerotic disease of the abdominal aorta. Scattered colonic diverticula. SKELETON: Focal FDG uptake at the T5 vertebral body with an SUV max of 4.4. Mild focal FDG uptake of the lateral 8th rib with subtle associated sclerosis on CT, SUV max of 2.8. Incidental CT findings: Posterior left rib fractures with associated FDG uptake, no findings to suggest pathologic fractures. IMPRESSION: Hypermetabolic left upper lobe pulmonary nodule, concerning for primary lung malignancy. No evidence of visceral metastatic disease in the chest, abdomen or pelvis.  Multiple posterior left rib fractures which are new compared to prior CT and demonstrate associated FDG uptake. No finding to suggest pathologic fracture. Mild focal FDG uptake at the T5 vertebral body and lateral eighth rib, potentially post traumatic given evidence of interval trauma, although metastatic disease cannot be excluded. Dedicated small field of view imaging of the thoracic spine could be useful for further evaluation of T5 lesion. Alternatively, recommend attention on follow-up. Electronically Signed   By: Yetta Glassman M.D.   On: 09/28/2021 16:41       I have independently reviewed the above radiology studies  and reviewed the findings with  the patient.   Recent Lab Findings: Lab Results  Component Value Date   WBC 8.5 09/12/2021   HGB 13.7 09/12/2021   HCT 39.8 09/12/2021   PLT 190 09/12/2021   GLUCOSE 152 (H) 09/12/2021   ALT 15 09/12/2021   AST 19 09/12/2021   NA 139 09/12/2021   K 4.5 09/12/2021   CL 106 09/12/2021   CREATININE 1.74 (H) 09/12/2021   BUN 27 (H) 09/12/2021   CO2 22 09/12/2021     PFTs: - FVC: ***% - FEV1: ***% -DLCO: ***%  Problem List: ***  Assessment / Plan:   70 year old male with a 3.1 cm left upper lobe pulmonary mass concerning for primary lung cancer.  He will require pulmonary function testing given his significant smoking history.  Additionally on PET/CT he is noted to have coronary calcifications as well as some avidity in his T5 spine and along his eighth rib which may be posttraumatic in nature.  If his pulmonary function testing are acceptable then he will require stress test.  He is potentially somebody that he can undergo a combination procedure with Dr.Icard for tissue diagnosis so that we can proceed directly with a lobectomy.     I  spent {CHL ONC TIME VISIT - BZJIR:[id number]} with  the patient face to face in counseling and coordination of care.    Lajuana Matte 10/10/2021 2:26 PM

## 2021-10-12 ENCOUNTER — Encounter: Payer: Medicare Other | Admitting: Thoracic Surgery (Cardiothoracic Vascular Surgery)

## 2021-10-14 ENCOUNTER — Encounter: Payer: Self-pay | Admitting: Internal Medicine

## 2021-10-15 ENCOUNTER — Other Ambulatory Visit: Payer: Self-pay | Admitting: Thoracic Surgery (Cardiothoracic Vascular Surgery)

## 2021-10-16 LAB — SARS CORONAVIRUS 2 (TAT 6-24 HRS): SARS Coronavirus 2: NEGATIVE

## 2021-10-17 ENCOUNTER — Other Ambulatory Visit: Payer: Self-pay

## 2021-10-17 ENCOUNTER — Ambulatory Visit (HOSPITAL_COMMUNITY)
Admission: RE | Admit: 2021-10-17 | Discharge: 2021-10-17 | Disposition: A | Discharge status: Home / Self Care | Payer: Medicare Other | Source: Ambulatory Visit | Attending: Thoracic Surgery (Cardiothoracic Vascular Surgery) | Admitting: Thoracic Surgery (Cardiothoracic Vascular Surgery)

## 2021-10-17 DIAGNOSIS — R0609 Other forms of dyspnea: Secondary | ICD-10-CM | POA: Insufficient documentation

## 2021-10-17 DIAGNOSIS — Z87891 Personal history of nicotine dependence: Secondary | ICD-10-CM | POA: Diagnosis not present

## 2021-10-17 DIAGNOSIS — R911 Solitary pulmonary nodule: Secondary | ICD-10-CM | POA: Diagnosis not present

## 2021-10-17 LAB — PULMONARY FUNCTION TEST
DL/VA % pred: 68 %
DL/VA: 2.76 ml/min/mmHg/L
DLCO unc % pred: 70 %
DLCO unc: 19 ml/min/mmHg
FEF 25-75 Post: 3.35 L/sec
FEF 25-75 Pre: 2.33 L/sec
FEF2575-%Change-Post: 44 %
FEF2575-%Pred-Post: 128 %
FEF2575-%Pred-Pre: 88 %
FEV1-%Change-Post: 9 %
FEV1-%Pred-Post: 106 %
FEV1-%Pred-Pre: 97 %
FEV1-Post: 3.65 L
FEV1-Pre: 3.32 L
FEV1FVC-%Change-Post: 3 %
FEV1FVC-%Pred-Pre: 98 %
FEV6-%Change-Post: 4 %
FEV6-%Pred-Post: 106 %
FEV6-%Pred-Pre: 102 %
FEV6-Post: 4.68 L
FEV6-Pre: 4.49 L
FEV6FVC-%Change-Post: -1 %
FEV6FVC-%Pred-Post: 102 %
FEV6FVC-%Pred-Pre: 103 %
FVC-%Change-Post: 6 %
FVC-%Pred-Post: 105 %
FVC-%Pred-Pre: 99 %
FVC-Post: 4.87 L
FVC-Pre: 4.59 L
Post FEV1/FVC ratio: 75 %
Post FEV6/FVC ratio: 97 %
Pre FEV1/FVC ratio: 72 %
Pre FEV6/FVC Ratio: 98 %
RV % pred: 149 %
RV: 3.7 L
TLC % pred: 118 %
TLC: 8.59 L

## 2021-10-17 MED ORDER — ALBUTEROL SULFATE (2.5 MG/3ML) 0.083% IN NEBU
2.5000 mg | INHALATION_SOLUTION | Freq: Once | RESPIRATORY_TRACT | Status: AC
  Administered 2021-10-17: 2.5 mg via RESPIRATORY_TRACT

## 2021-10-19 ENCOUNTER — Institutional Professional Consult (permissible substitution) (INDEPENDENT_AMBULATORY_CARE_PROVIDER_SITE_OTHER): Payer: Medicare Other | Admitting: Thoracic Surgery (Cardiothoracic Vascular Surgery)

## 2021-10-19 ENCOUNTER — Other Ambulatory Visit: Payer: Self-pay | Admitting: Thoracic Surgery (Cardiothoracic Vascular Surgery)

## 2021-10-19 ENCOUNTER — Other Ambulatory Visit: Payer: Self-pay

## 2021-10-19 VITALS — BP 129/84 | HR 61 | Ht 71.0 in | Wt 195.0 lb

## 2021-10-19 DIAGNOSIS — R911 Solitary pulmonary nodule: Secondary | ICD-10-CM

## 2021-10-19 DIAGNOSIS — I10 Essential (primary) hypertension: Secondary | ICD-10-CM

## 2021-10-19 NOTE — Progress Notes (Signed)
[address]       ,[place] [address]             [phone]                    [name] Medical Record #[id number] Date of Birth: Jul 01, 1951  Referring: Heilingoetter, Architectural technologist* Primary Care: [facility] Primary Cardiologist: None  Chief Complaint:    Chief Complaint  Patient presents with   Lung Lesion    Surgical consult, PET Scan 09/27/21, CTA Chest 09/04/21, MR Brain 09/20/21, PFT's 10/17/21     History of Present Illness:    [name] 70 y.o. male referred by Dr. Julien Nordmann for surgical evaluation of a 3.1 cm PET avid left upper lobe pulmonary mass.  This was found incidentally when he presented to the emergency department complaining of some chest and back pain.  Imaging was consistent with some rib fractures as well as this 3.1 cm pulmonary mass.  He has undergone an MRI brain which is shown no evidence of metastatic disease, as well as a PET/CT which shows significant avidity in the pulmonary mass.  He has no hilar or mediastinal lymph uptake.    Smoking Hx: Quit tobacco use several months ago.   Zubrod Score: At the time of surgery this patient's most appropriate activity status/level should be described as: []     0    Normal activity, no symptoms []     1    Restricted in physical strenuous activity but ambulatory, able to do out light work []     2    Ambulatory and capable of self care, unable to do work activities, up and about               >50 % of waking hours                              []     3    Only limited self care, in bed greater than 50% of waking hours []     4    Completely disabled, no self care, confined to bed or chair []     5    Moribund   Past Medical History:  Diagnosis Date   Diabetes mellitus ([place])    Dyslipidemia    Hypertension     Past Surgical History:  Procedure Laterality Date   INGUINAL HERNIA REPAIR      Family History  Problem Relation Age of Onset    Mental illness Mother      Social History   Tobacco Use  Smoking Status Former   Packs/day: 1.00   Years: 47.00   Pack years: 47.00   Types: Cigarettes   Quit date: 08/29/2021   Years since quitting: 0.1  Smokeless Tobacco Never    Social History   Substance and Sexual Activity  Alcohol Use Yes   Alcohol/week: 2.0 standard drinks   Types: 2 Glasses of wine per week     No Known Allergies  Current Outpatient Medications  Medication Sig Dispense Refill   atenolol (TENORMIN) 100 MG tablet Take 100 mg by mouth daily.     atorvastatin (LIPITOR) 20 MG tablet Take 20 mg by mouth daily.     famotidine (PEPCID) 20 MG tablet Take 20 mg by mouth 2 (two) times daily.     losartan-hydrochlorothiazide (HYZAAR) 100-12.5 MG tablet Take 1 tablet by  mouth daily.     metFORMIN (GLUCOPHAGE-XR) 500 MG 24 hr tablet Take 500 mg by mouth 2 (two) times daily.     oxyCODONE-acetaminophen (PERCOCET/ROXICET) 5-325 MG tablet Take 1 tablet by mouth every 6 (six) hours as needed for severe pain. 40 tablet 0   atorvastatin (LIPITOR) 10 MG tablet Take 10 mg by mouth daily. Dose is unknown     fluconazole (DIFLUCAN) 100 MG tablet Take 100 mg by mouth daily.     metFORMIN (GLUCOPHAGE) 500 MG tablet Take by mouth 2 (two) times daily with a meal. Dose unknown     No current facility-administered medications for this visit.    Review of Systems  Constitutional:  Negative for malaise/fatigue and weight loss.  Respiratory:  Negative for shortness of breath.   Cardiovascular:  Negative for chest pain.  Neurological:  Positive for dizziness.    PHYSICAL EXAMINATION: BP 129/84   Pulse 61   Ht 5\' 11"  (1.803 m)   Wt 195 lb (88.5 kg)   SpO2 99% Comment: RA  BMI 27.20 kg/m  Physical Exam Constitutional:      General: He is not in acute distress.    Appearance: Normal appearance. He is normal weight. He is not ill-appearing.  HENT:     Head: Normocephalic and atraumatic.  Cardiovascular:     Rate and  Rhythm: Normal rate.  Pulmonary:     Effort: Pulmonary effort is normal.  Abdominal:     General: Abdomen is flat. There is no distension.  Musculoskeletal:        General: Normal range of motion.  Neurological:     General: No focal deficit present.     Mental Status: He is alert and oriented to person, place, and time.    Diagnostic Studies & Laboratory data:     Recent Radiology Findings:   MR BRAIN W WO CONTRAST  Result Date: 09/22/2021 CLINICAL DATA:  Non-small cell lung cancer staging EXAM: MRI HEAD WITHOUT AND WITH CONTRAST TECHNIQUE: Multiplanar, multiecho pulse sequences of the brain and surrounding structures were obtained without and with intravenous contrast. CONTRAST:  64mL GADAVIST GADOBUTROL 1 MMOL/ML IV SOLN COMPARISON:  None. FINDINGS: Brain: No mass or swelling to suggest metastatic disease. No recent infarction, hemorrhage, hydrocephalus, extra-axial collection or mass lesion. Moderate chronic small vessel ischemia. Mild brain atrophy Vascular: Normal flow voids. Skull and upper cervical spine: Normal marrow signal. Sinuses/Orbits: Negative. IMPRESSION: Negative for metastatic disease. Electronically Signed   By: Jorje Guild M.D.   On: 09/22/2021 14:48   NM PET Image Initial (PI) Skull Base To Thigh (F-18 FDG)  Result Date: 09/28/2021 CLINICAL DATA:  Initial treatment strategy for pulmonary nodule. EXAM: NUCLEAR MEDICINE PET SKULL BASE TO THIGH TECHNIQUE: 10.0 mCi F-18 FDG was injected intravenously. Full-ring PET imaging was performed from the skull base to thigh after the radiotracer. CT data was obtained and used for attenuation correction and anatomic localization. Fasting blood glucose: 149 mg/dl COMPARISON:  Chest CT dated September 04, 2021 FINDINGS: Mediastinal blood pool activity: SUV max 2.4 Liver activity: SUV max 3.7 NECK: No hypermetabolic lymph nodes in the neck. Incidental CT findings: none CHEST: Spiculated mass of the left upper lobe which is unchanged in  size compared to prior exam and demonstrates hypermetabolic activity, SUV max of 8.6. Right supraclavicular lymph node measuring 6 mm in short axis on series 4, image 58 with SUV max of 2.5, similar to mediastinal blood pool. No hypermetabolic lymph nodes seen in the chest. Incidental CT  findings: Mild cardiomegaly. Atherosclerotic disease of the thoracic aorta. Coronary artery calcifications of the RCA and LAD. Unchanged dilation of the ascending thoracic aorta, measuring up to 4.0 cm. ABDOMEN/PELVIS: No abnormal hypermetabolic activity within the liver, pancreas, adrenal glands, or spleen. No hypermetabolic lymph nodes in the abdomen or pelvis. Incidental CT findings: Low-attenuation exophytic lesion of the left kidney, likely a simple cyst. Atherosclerotic disease of the abdominal aorta. Scattered colonic diverticula. SKELETON: Focal FDG uptake at the T5 vertebral body with an SUV max of 4.4. Mild focal FDG uptake of the lateral 8th rib with subtle associated sclerosis on CT, SUV max of 2.8. Incidental CT findings: Posterior left rib fractures with associated FDG uptake, no findings to suggest pathologic fractures. IMPRESSION: Hypermetabolic left upper lobe pulmonary nodule, concerning for primary lung malignancy. No evidence of visceral metastatic disease in the chest, abdomen or pelvis. Multiple posterior left rib fractures which are new compared to prior CT and demonstrate associated FDG uptake. No finding to suggest pathologic fracture. Mild focal FDG uptake at the T5 vertebral body and lateral eighth rib, potentially post traumatic given evidence of interval trauma, although metastatic disease cannot be excluded. Dedicated small field of view imaging of the thoracic spine could be useful for further evaluation of T5 lesion. Alternatively, recommend attention on follow-up. Electronically Signed   By: Yetta Glassman M.D.   On: 09/28/2021 16:41       I have independently reviewed the above radiology  studies  and reviewed the findings with the patient.   Recent Lab Findings: Lab Results  Component Value Date   WBC 8.5 09/12/2021   HGB 13.7 09/12/2021   HCT 39.8 09/12/2021   PLT 190 09/12/2021   GLUCOSE 152 (H) 09/12/2021   ALT 15 09/12/2021   AST 19 09/12/2021   NA 139 09/12/2021   K 4.5 09/12/2021   CL 106 09/12/2021   CREATININE 1.74 (H) 09/12/2021   BUN 27 (H) 09/12/2021   CO2 22 09/12/2021     PFTs: - FVC: 99% - FEV1: 97% -DLCO: 70%   Assessment / Plan:   70 year old male with a 3.1 cm left upper lobe pulmonary mass concerning for primary lung cancer.  He will require a stress test given his coronary calcifications and smoking history.  He is potentially somebody that he can undergo a combination procedure with Dr.Icard for tissue diagnosis so that we can proceed directly with a lobectomy.  We will look for dates for a left robotic assisted left upper lobectomy combination with the navigational bronchoscopy.     I  spent 40 minutes with  the patient face to face in counseling and coordination of care.    Lajuana Matte 10/19/2021 10:34 AM

## 2021-10-19 NOTE — H&P (View-Only) (Signed)
[address]       [place] [address]             [phone]                    Dorris V Pender [place] Medical Record #[id number] Date of Birth: [birth year]  Referring: Heilingoetter, Architectural technologist* Primary Care: [facility] Primary Cardiologist: None  Chief Complaint:    Chief Complaint  Patient presents with   Lung Lesion    Surgical consult, PET Scan 09/27/21, CTA Chest 09/04/21, MR Brain 09/20/21, PFT's 10/17/21     History of Present Illness:    [name] 70 y.o. male referred by Dr. Julien Nordmann for surgical evaluation of a 3.1 cm PET avid left upper lobe pulmonary mass.  This was found incidentally when he presented to the emergency department complaining of some chest and back pain.  Imaging was consistent with some rib fractures as well as this 3.1 cm pulmonary mass.  He has undergone an MRI brain which is shown no evidence of metastatic disease, as well as a PET/CT which shows significant avidity in the pulmonary mass.  He has no hilar or mediastinal lymph uptake.    Smoking Hx: Quit tobacco use several months ago.   Zubrod Score: At the time of surgery this patient's most appropriate activity status/level should be described as: []     0    Normal activity, no symptoms []     1    Restricted in physical strenuous activity but ambulatory, able to do out light work []     2    Ambulatory and capable of self care, unable to do work activities, up and about               >50 % of waking hours                              []     3    Only limited self care, in bed greater than 50% of waking hours []     4    Completely disabled, no self care, confined to bed or chair []     5    Moribund   Past Medical History:  Diagnosis Date   Diabetes mellitus ([place])    Dyslipidemia    Hypertension     Past Surgical History:  Procedure Laterality Date   INGUINAL HERNIA REPAIR      Family History  Problem Relation Age of Onset    Mental illness Mother      Social History   Tobacco Use  Smoking Status Former   Packs/day: 1.00   Years: 47.00   Pack years: 47.00   Types: Cigarettes   Quit date: 08/29/2021   Years since quitting: 0.1  Smokeless Tobacco Never    Social History   Substance and Sexual Activity  Alcohol Use Yes   Alcohol/week: 2.0 standard drinks   Types: 2 Glasses of wine per week     No Known Allergies  Current Outpatient Medications  Medication Sig Dispense Refill   atenolol (TENORMIN) 100 MG tablet Take 100 mg by mouth daily.     atorvastatin (LIPITOR) 20 MG tablet Take 20 mg by mouth daily.     famotidine (PEPCID) 20 MG tablet Take 20 mg by mouth 2 (two) times daily.     losartan-hydrochlorothiazide (HYZAAR) 100-12.5 MG tablet Take 1 tablet by  mouth daily.     metFORMIN (GLUCOPHAGE-XR) 500 MG 24 hr tablet Take 500 mg by mouth 2 (two) times daily.     oxyCODONE-acetaminophen (PERCOCET/ROXICET) 5-325 MG tablet Take 1 tablet by mouth every 6 (six) hours as needed for severe pain. 40 tablet 0   atorvastatin (LIPITOR) 10 MG tablet Take 10 mg by mouth daily. Dose is unknown     fluconazole (DIFLUCAN) 100 MG tablet Take 100 mg by mouth daily.     metFORMIN (GLUCOPHAGE) 500 MG tablet Take by mouth 2 (two) times daily with a meal. Dose unknown     No current facility-administered medications for this visit.    Review of Systems  Constitutional:  Negative for malaise/fatigue and weight loss.  Respiratory:  Negative for shortness of breath.   Cardiovascular:  Negative for chest pain.  Neurological:  Positive for dizziness.    PHYSICAL EXAMINATION: BP 129/84   Pulse 61   Ht 5\' 11"  (1.803 m)   Wt 195 lb (88.5 kg)   SpO2 99% Comment: RA  BMI 27.20 kg/m  Physical Exam Constitutional:      General: He is not in acute distress.    Appearance: Normal appearance. He is normal weight. He is not ill-appearing.  HENT:     Head: Normocephalic and atraumatic.  Cardiovascular:     Rate and  Rhythm: Normal rate.  Pulmonary:     Effort: Pulmonary effort is normal.  Abdominal:     General: Abdomen is flat. There is no distension.  Musculoskeletal:        General: Normal range of motion.  Neurological:     General: No focal deficit present.     Mental Status: He is alert and oriented to person, place, and time.    Diagnostic Studies & Laboratory data:     Recent Radiology Findings:   MR BRAIN W WO CONTRAST  Result Date: 09/22/2021 CLINICAL DATA:  Non-small cell lung cancer staging EXAM: MRI HEAD WITHOUT AND WITH CONTRAST TECHNIQUE: Multiplanar, multiecho pulse sequences of the brain and surrounding structures were obtained without and with intravenous contrast. CONTRAST:  25mL GADAVIST GADOBUTROL 1 MMOL/ML IV SOLN COMPARISON:  None. FINDINGS: Brain: No mass or swelling to suggest metastatic disease. No recent infarction, hemorrhage, hydrocephalus, extra-axial collection or mass lesion. Moderate chronic small vessel ischemia. Mild brain atrophy Vascular: Normal flow voids. Skull and upper cervical spine: Normal marrow signal. Sinuses/Orbits: Negative. IMPRESSION: Negative for metastatic disease. Electronically Signed   By: Jorje Guild M.D.   On: 09/22/2021 14:48   NM PET Image Initial (PI) Skull Base To Thigh (F-18 FDG)  Result Date: 09/28/2021 CLINICAL DATA:  Initial treatment strategy for pulmonary nodule. EXAM: NUCLEAR MEDICINE PET SKULL BASE TO THIGH TECHNIQUE: 10.0 mCi F-18 FDG was injected intravenously. Full-ring PET imaging was performed from the skull base to thigh after the radiotracer. CT data was obtained and used for attenuation correction and anatomic localization. Fasting blood glucose: 149 mg/dl COMPARISON:  Chest CT dated September 04, 2021 FINDINGS: Mediastinal blood pool activity: SUV max 2.4 Liver activity: SUV max 3.7 NECK: No hypermetabolic lymph nodes in the neck. Incidental CT findings: none CHEST: Spiculated mass of the left upper lobe which is unchanged in  size compared to prior exam and demonstrates hypermetabolic activity, SUV max of 8.6. Right supraclavicular lymph node measuring 6 mm in short axis on series 4, image 58 with SUV max of 2.5, similar to mediastinal blood pool. No hypermetabolic lymph nodes seen in the chest. Incidental CT  findings: Mild cardiomegaly. Atherosclerotic disease of the thoracic aorta. Coronary artery calcifications of the RCA and LAD. Unchanged dilation of the ascending thoracic aorta, measuring up to 4.0 cm. ABDOMEN/PELVIS: No abnormal hypermetabolic activity within the liver, pancreas, adrenal glands, or spleen. No hypermetabolic lymph nodes in the abdomen or pelvis. Incidental CT findings: Low-attenuation exophytic lesion of the left kidney, likely a simple cyst. Atherosclerotic disease of the abdominal aorta. Scattered colonic diverticula. SKELETON: Focal FDG uptake at the T5 vertebral body with an SUV max of 4.4. Mild focal FDG uptake of the lateral 8th rib with subtle associated sclerosis on CT, SUV max of 2.8. Incidental CT findings: Posterior left rib fractures with associated FDG uptake, no findings to suggest pathologic fractures. IMPRESSION: Hypermetabolic left upper lobe pulmonary nodule, concerning for primary lung malignancy. No evidence of visceral metastatic disease in the chest, abdomen or pelvis. Multiple posterior left rib fractures which are new compared to prior CT and demonstrate associated FDG uptake. No finding to suggest pathologic fracture. Mild focal FDG uptake at the T5 vertebral body and lateral eighth rib, potentially post traumatic given evidence of interval trauma, although metastatic disease cannot be excluded. Dedicated small field of view imaging of the thoracic spine could be useful for further evaluation of T5 lesion. Alternatively, recommend attention on follow-up. Electronically Signed   By: Yetta Glassman M.D.   On: 09/28/2021 16:41       I have independently reviewed the above radiology  studies  and reviewed the findings with the patient.   Recent Lab Findings: Lab Results  Component Value Date   WBC 8.5 09/12/2021   HGB 13.7 09/12/2021   HCT 39.8 09/12/2021   PLT 190 09/12/2021   GLUCOSE 152 (H) 09/12/2021   ALT 15 09/12/2021   AST 19 09/12/2021   NA 139 09/12/2021   K 4.5 09/12/2021   CL 106 09/12/2021   CREATININE 1.74 (H) 09/12/2021   BUN 27 (H) 09/12/2021   CO2 22 09/12/2021     PFTs: - FVC: 99% - FEV1: 97% -DLCO: 70%   Assessment / Plan:   70 year old male with a 3.1 cm left upper lobe pulmonary mass concerning for primary lung cancer.  He will require a stress test given his coronary calcifications and smoking history.  He is potentially somebody that he can undergo a combination procedure with Dr.Icard for tissue diagnosis so that we can proceed directly with a lobectomy.  We will look for dates for a left robotic assisted left upper lobectomy combination with the navigational bronchoscopy.     I  spent 40 minutes with  the patient face to face in counseling and coordination of care.    Lajuana Matte 10/19/2021 10:34 AM

## 2021-10-24 ENCOUNTER — Encounter: Payer: Self-pay | Admitting: *Deleted

## 2021-10-24 ENCOUNTER — Other Ambulatory Visit: Payer: Self-pay | Admitting: *Deleted

## 2021-10-24 DIAGNOSIS — R911 Solitary pulmonary nodule: Secondary | ICD-10-CM

## 2021-10-26 ENCOUNTER — Telehealth: Payer: Self-pay | Admitting: Pulmonary Disease

## 2021-10-26 DIAGNOSIS — R911 Solitary pulmonary nodule: Secondary | ICD-10-CM

## 2021-10-26 NOTE — Telephone Encounter (Signed)
-----   Message from Laury Deep, RN sent at 10/24/2021  9:18 AM EST ----- 12/1 for both of these combo cases would be great.  They closed the Robot room on 11/23 and HL is office 12/2.  Thanks, Thurmond Butts ----- Message ----- From: Garner Nash, DO Sent: 10/23/2021   5:27 PM EST To: Darral Dash, RN, Laury Deep, RN, #   Ryan,   November 23rd?   Dec 1st or 2nd?  Thanks  Brad    ----- Message ----- From: Lajuana Matte, MD Sent: 10/19/2021   3:45 PM EST To: Garner Nash, DO  Hey, Can you look at some dates for combination procedure with this guy.  Thanks, H

## 2021-10-26 NOTE — Telephone Encounter (Signed)
PCCM:  Orders placed for combination case with Dr. Kipp Brood on 11/15/2021.  Orders placed for super D CT scan of the chest which will need to be done prior to procedure for navigational planning.  Garner Nash, DO [facility] 10/26/2021 5:33 PM

## 2021-10-29 ENCOUNTER — Encounter: Payer: Self-pay | Admitting: Pulmonary Disease

## 2021-10-29 ENCOUNTER — Other Ambulatory Visit: Payer: Self-pay

## 2021-10-29 ENCOUNTER — Ambulatory Visit (HOSPITAL_COMMUNITY): Payer: Medicare Other | Attending: Internal Medicine

## 2021-10-29 ENCOUNTER — Encounter (HOSPITAL_COMMUNITY): Payer: Self-pay | Admitting: *Deleted

## 2021-10-29 DIAGNOSIS — I10 Essential (primary) hypertension: Secondary | ICD-10-CM | POA: Insufficient documentation

## 2021-10-29 DIAGNOSIS — R911 Solitary pulmonary nodule: Secondary | ICD-10-CM | POA: Diagnosis present

## 2021-10-29 DIAGNOSIS — Z01810 Encounter for preprocedural cardiovascular examination: Secondary | ICD-10-CM

## 2021-10-29 DIAGNOSIS — R079 Chest pain, unspecified: Secondary | ICD-10-CM | POA: Diagnosis not present

## 2021-10-29 MED ORDER — TECHNETIUM TC 99M TETROFOSMIN IV KIT
9.2000 | PACK | Freq: Once | INTRAVENOUS | Status: AC | PRN
  Administered 2021-10-29: 9.2 via INTRAVENOUS
  Filled 2021-10-29: qty 10

## 2021-11-05 ENCOUNTER — Ambulatory Visit (INDEPENDENT_AMBULATORY_CARE_PROVIDER_SITE_OTHER)
Admission: RE | Admit: 2021-11-05 | Discharge: 2021-11-05 | Disposition: A | Discharge status: Home / Self Care | Payer: Medicare Other | Source: Ambulatory Visit | Attending: Pulmonary Disease | Admitting: Pulmonary Disease

## 2021-11-05 ENCOUNTER — Ambulatory Visit (HOSPITAL_COMMUNITY): Payer: Medicare Other

## 2021-11-05 ENCOUNTER — Other Ambulatory Visit: Payer: Self-pay

## 2021-11-05 ENCOUNTER — Encounter: Payer: Self-pay | Admitting: Internal Medicine

## 2021-11-05 DIAGNOSIS — R911 Solitary pulmonary nodule: Secondary | ICD-10-CM | POA: Insufficient documentation

## 2021-11-05 LAB — MYOCARDIAL PERFUSION IMAGING
LV dias vol: 101 mL (ref 62–150)
LV sys vol: 51 mL
Nuc Stress EF: 50 %
Peak HR: 75 {beats}/min
Rest HR: 68 {beats}/min
Rest Nuclear Isotope Dose: 9.2 mCi
SDS: 5
SRS: 1
SSS: 6
ST Depression (mm): 0 mm
Stress Nuclear Isotope Dose: 26.5 mCi
TID: 1.17

## 2021-11-05 MED ORDER — REGADENOSON 0.4 MG/5ML IV SOLN
0.4000 mg | Freq: Once | INTRAVENOUS | Status: AC
  Administered 2021-11-05: 0.4 mg via INTRAVENOUS

## 2021-11-05 MED ORDER — TECHNETIUM TC 99M TETROFOSMIN IV KIT
26.5000 | PACK | Freq: Once | INTRAVENOUS | Status: AC | PRN
  Administered 2021-11-05: 26.5 via INTRAVENOUS
  Filled 2021-11-05: qty 27

## 2021-11-05 MED FILL — Regadenoson IV Inj 0.4 MG/5ML (0.08 MG/ML): INTRAVENOUS | Qty: 5 | Status: AC

## 2021-11-06 ENCOUNTER — Telehealth: Payer: Self-pay | Admitting: Pulmonary Disease

## 2021-11-06 NOTE — Telephone Encounter (Signed)
Spoke with Malachy Mood at [facility]- call report on super D chest ct dated 11/05/21  IMPRESSION: 1. Spiculated nodule of the left upper lobe is unchanged in size compared to prior exam. 2. No pathologically enlarged lymph nodes seen in the chest. Subcentimeter right supraclavicular lymph node is unchanged in size when compared with prior exams and was not hypermetabolic on PET-CT, likely reactive. 3. Nondisplaced fracture extending through the T5 vertebral body which does not involve the posterior wall with a background of diffuse idiopathic skeletal hyperostosis (DISH). This finding correlates with previously seen focal FDG uptake of the T5 vertebral body and is likely subacute. 4. Subacute left posterolateral rib fractures with evidence of interval healing. 5.  Aortic Atherosclerosis (ICD10-I70.0).   These results will be called to the ordering clinician or representative by the Radiologist Assistant, and communication documented in the PACS or [employer].     Electronically Signed   By: Yetta Glassman M.D.   On: 11/06/2021 09:14  Forwarding to Dr Valeta Harms

## 2021-11-06 NOTE — Telephone Encounter (Signed)
Garner Nash, DO  Rosana Berger, CMA; [facility] Triage Pool; Darral Dash, RN 22 minutes ago (10:28 AM)   Ok thanks  Scheduled for surgery on 11/15/2021  BLI     Noted. Nothing further needed

## 2021-11-12 ENCOUNTER — Inpatient Hospital Stay: Admission: RE | Admit: 2021-11-12 | Payer: Medicare Other | Source: Ambulatory Visit

## 2021-11-12 ENCOUNTER — Inpatient Hospital Stay: Payer: Medicare Other | Admitting: Internal Medicine

## 2021-11-12 NOTE — Progress Notes (Signed)
Surgical Instructions   Your procedure is scheduled on Thursday 11/15/2021.  Report to [facility] Main Entrance "A" at 05:30 A.M., then check in with the Admitting office.  Call [phone] if you have problems or questions between now and the morning of surgery:   Remember: Do not eat or drink after midnight the night before your surgery    Take these medicines the morning of surgery with A SIP OF WATER:  Atenolol (Tenormin) Atorvastatin (Lipitor) Famotidine (Pepcid)   If needed you may take these medications the morning of surgery:    As of today, STOP taking any Aspirin (unless otherwise instructed by your surgeon) or Aspirin-containing products; NSAIDS - Aleve, Naproxen, Ibuprofen, Motrin, Advil, Goody's, BC's, all herbal medications, fish oil, and all vitamins.  WHAT DO I DO ABOUT MY DIABETES MEDICATION?   Do not take oral diabetes medicines (pills) the morning of surgery. - DO NOT take Metformin (Glucophage-XR) the morning of surgery  HOW TO MANAGE YOUR DIABETES BEFORE AND AFTER SURGERY  Why is it important to control my blood sugar before and after surgery? Improving blood sugar levels before and after surgery helps healing and can limit problems. A way of improving blood sugar control is eating a healthy diet by:  Eating less sugar and carbohydrates  Increasing activity/exercise  Talking with your doctor about reaching your blood sugar goals High blood sugars (greater than 180 mg/dL) can raise your risk of infections and slow your recovery, so you will need to focus on controlling your diabetes during the weeks before surgery. Make sure that the doctor who takes care of your diabetes knows about your planned surgery including the date and location.  How do I manage my blood sugar before surgery? Check your blood sugar at least 4 times a day, starting 2 days before surgery, to make sure that the level is not too high or low.  Check your blood sugar the morning of  your surgery when you wake up and every 2 hours until you get to the Short Stay unit.  If your blood sugar is less than 70 mg/dL, you will need to treat for low blood sugar: Do not take insulin. Treat a low blood sugar (less than 70 mg/dL) with  cup of clear juice (cranberry or apple), 4 glucose tablets, OR glucose gel. Recheck blood sugar in 15 minutes after treatment (to make sure it is greater than 70 mg/dL). If your blood sugar is not greater than 70 mg/dL on recheck, call [phone] for further instructions. Report your blood sugar to the short stay nurse when you get to Short Stay.  If you are admitted to the hospital after surgery: Your blood sugar will be checked by the staff and you will probably be given insulin after surgery (instead of oral diabetes medicines) to make sure you have good blood sugar levels. The goal for blood sugar control after surgery is 80-180 mg/dL.    3 days leading up to your surgery or after your pre-procedure COVID test You are not required to quarantine however you are required to wear a well-fitting mask when you are out and around people not in your household.  If your mask becomes wet or soiled, replace with a new one.  Wash your hands often with soap and water for 20 seconds or clean your hands with an alcohol-based hand sanitizer that contains at least 60% alcohol.  Do not share personal items.  Notify your provider: if you are in close contact with someone  who has COVID  or if you develop a fever of 100.4 or greater, sneezing, cough, sore throat, shortness of breath or body aches.          Do not wear jewelry or makeup  Do not wear lotions, powders, perfumes/colognes, or deodorant.  Do not shave 48 hours prior to surgery.  Men may shave face and neck.  Do not wear nail polish, gel polish, artificial nails, or any other type of covering on natural nails including fingernails and toenails. If patients have artificial nails, gel coating, etc.  that need to be removed by a nail salon please have this removed prior to surgery or surgery may need to be canceled/delayed if the surgeon/ anesthesia feels like the patient is unable to be adequately monitored.  Do not bring valuables to the hospital - [facility] is not responsible for any belongings or valuables.  Do NOT Smoke (Tobacco/Vaping) or drink Alcohol 24 hours prior to your procedure  If you use a CPAP at night, please bring your mask for your overnight stay.   Contacts, glasses, hearing aids, dentures or partials may not be worn into surgery, please bring cases for these belongings   For patients admitted to the hospital, discharge time will be determined by your treatment team.   Patients discharged the day of surgery will not be allowed to drive home, and someone needs to stay with them for 24 hours.  NO VISITORS WILL BE ALLOWED IN PRE-OP WHERE PATIENTS ARE PREPPED FOR SURGERY.  ONLY 1 SUPPORT PERSON MAY BE PRESENT IN THE WAITING ROOM WHILE YOU ARE IN SURGERY.  IF YOU ARE TO BE ADMITTED, ONCE YOU ARE IN YOUR ROOM YOU WILL BE ALLOWED TWO (2) VISITORS. 1 (ONE) VISITOR MAY STAY OVERNIGHT BUT MUST ARRIVE TO THE ROOM BY 8pm.  Minor children may have two parents present. Special consideration for safety and communication needs will be reviewed on a case by case basis.  Special instructions:    Oral Hygiene is also important to reduce your risk of infection.  Remember - BRUSH YOUR TEETH THE MORNING OF SURGERY WITH YOUR REGULAR TOOTHPASTE   [place]- Preparing For Surgery  Before surgery, you can play an important role. Because skin is not sterile, your skin needs to be as free of germs as possible. You can reduce the number of germs on your skin by washing with CHG (chlorahexidine gluconate) Soap before surgery.  CHG is an antiseptic cleaner which kills germs and bonds with the skin to continue killing germs even after washing.     Please do not use if you have an allergy to CHG  or antibacterial soaps. If your skin becomes reddened/irritated stop using the CHG.  Do not shave (including legs and underarms) for at least 48 hours prior to first CHG shower. It is OK to shave your face.  Please follow these instructions carefully.     Shower the NIGHT BEFORE SURGERY and the MORNING OF SURGERY with CHG Soap.   If you chose to wash your hair, wash your hair first as usual with your normal shampoo. After you shampoo, rinse your hair and body thoroughly to remove the shampoo.    Then [employer] and genitals (private parts) with your normal soap and rinse thoroughly to remove soap.  Next use the CHG Soap as you would any other liquid soap. You can apply CHG directly to the skin and wash gently with a clean washcloth.   Apply the CHG Soap to  your body ONLY FROM THE NECK DOWN.  Do not use on open wounds or open sores. Avoid contact with your eyes, ears, mouth and genitals (private parts). Wash Face and genitals (private parts)  with your normal soap.   Wash thoroughly, paying special attention to the area where your surgery will be performed.  Thoroughly rinse your body with warm water from the neck down.  DO NOT shower/wash with your normal soap after using and rinsing off the CHG Soap.  Pat yourself dry with a CLEAN TOWEL.  Wear CLEAN PAJAMAS to bed the night before surgery  Place CLEAN SHEETS on your bed the night before your surgery  DO NOT SLEEP WITH PETS.   Day of Surgery:  Take a shower with CHG soap. Wear Clean/Comfortable clothing the morning of surgery Do not apply any deodorants/lotions.   Remember to brush your teeth WITH YOUR REGULAR TOOTHPASTE.   Please read over the fact sheets that you were given.

## 2021-11-13 ENCOUNTER — Encounter (HOSPITAL_COMMUNITY): Payer: Self-pay

## 2021-11-13 ENCOUNTER — Other Ambulatory Visit: Payer: Self-pay

## 2021-11-13 ENCOUNTER — Ambulatory Visit (HOSPITAL_COMMUNITY)
Admission: RE | Admit: 2021-11-13 | Discharge: 2021-11-13 | Disposition: A | Discharge status: Home / Self Care | Payer: Medicare Other | Source: Ambulatory Visit | Attending: Pulmonary Disease | Admitting: Pulmonary Disease

## 2021-11-13 ENCOUNTER — Encounter (HOSPITAL_COMMUNITY)
Admission: RE | Admit: 2021-11-13 | Discharge: 2021-11-13 | Disposition: A | Discharge status: Home / Self Care | Payer: Medicare Other | Source: Ambulatory Visit | Attending: Pulmonary Disease | Admitting: Pulmonary Disease

## 2021-11-13 VITALS — BP 151/104 | HR 68 | Temp 97.7°F | Resp 18 | Ht 71.0 in | Wt 198.2 lb

## 2021-11-13 DIAGNOSIS — R911 Solitary pulmonary nodule: Secondary | ICD-10-CM | POA: Insufficient documentation

## 2021-11-13 DIAGNOSIS — Z01818 Encounter for other preprocedural examination: Secondary | ICD-10-CM

## 2021-11-13 DIAGNOSIS — Z20822 Contact with and (suspected) exposure to covid-19: Secondary | ICD-10-CM | POA: Insufficient documentation

## 2021-11-13 LAB — SURGICAL PCR SCREEN
MRSA, PCR: NEGATIVE
Staphylococcus aureus: NEGATIVE

## 2021-11-13 LAB — URINALYSIS, ROUTINE W REFLEX MICROSCOPIC
Bacteria, UA: NONE SEEN
Bilirubin Urine: NEGATIVE
Glucose, UA: NEGATIVE mg/dL
Hgb urine dipstick: NEGATIVE
Ketones, ur: NEGATIVE mg/dL
Leukocytes,Ua: NEGATIVE
Nitrite: NEGATIVE
Protein, ur: 30 mg/dL — AB
Specific Gravity, Urine: 1.013 (ref 1.005–1.030)
pH: 6 (ref 5.0–8.0)

## 2021-11-13 LAB — COMPREHENSIVE METABOLIC PANEL
ALT: 19 U/L (ref 0–44)
AST: 25 U/L (ref 15–41)
Albumin: 4.1 g/dL (ref 3.5–5.0)
Alkaline Phosphatase: 73 U/L (ref 38–126)
Anion gap: 11 (ref 5–15)
BUN: 24 mg/dL — ABNORMAL HIGH (ref 8–23)
CO2: 19 mmol/L — ABNORMAL LOW (ref 22–32)
Calcium: 9.2 mg/dL (ref 8.9–10.3)
Chloride: 107 mmol/L (ref 98–111)
Creatinine, Ser: 1.58 mg/dL — ABNORMAL HIGH (ref 0.61–1.24)
GFR, Estimated: 47 mL/min — ABNORMAL LOW (ref >60)
Glucose, Bld: 145 mg/dL — ABNORMAL HIGH (ref 70–99)
Potassium: 4.2 mmol/L (ref 3.5–5.1)
Sodium: 137 mmol/L (ref 135–145)
Total Bilirubin: 0.9 mg/dL (ref 0.3–1.2)
Total Protein: 7.4 g/dL (ref 6.5–8.1)

## 2021-11-13 LAB — CBC
HCT: 41.1 % (ref 39.0–52.0)
Hemoglobin: 13.8 g/dL (ref 13.0–17.0)
MCH: 31.5 pg (ref 26.0–34.0)
MCHC: 33.6 g/dL (ref 30.0–36.0)
MCV: 93.8 fL (ref 80.0–100.0)
Platelets: 200 10*3/uL (ref 150–400)
RBC: 4.38 MIL/uL (ref 4.22–5.81)
RDW: 13 % (ref 11.5–15.5)
WBC: 8.5 10*3/uL (ref 4.0–10.5)
nRBC: 0 % (ref 0.0–0.2)

## 2021-11-13 LAB — PROTIME-INR
INR: 0.9 (ref 0.8–1.2)
Prothrombin Time: 12.4 seconds (ref 11.4–15.2)

## 2021-11-13 LAB — APTT: aPTT: 29 seconds (ref 24–36)

## 2021-11-13 LAB — BLOOD GAS, ARTERIAL
Acid-base deficit: 4.1 mmol/L — ABNORMAL HIGH (ref 0.0–2.0)
Bicarbonate: 19.9 mmol/L — ABNORMAL LOW (ref 20.0–28.0)
FIO2: 0.21
O2 Saturation: 97.9 %
Patient temperature: 37
pCO2 arterial: 33.2 mmHg (ref 32.0–48.0)
pH, Arterial: 7.396 (ref 7.350–7.450)
pO2, Arterial: 102 mmHg (ref 83.0–108.0)

## 2021-11-13 LAB — GLUCOSE, CAPILLARY: Glucose-Capillary: 177 mg/dL — ABNORMAL HIGH (ref 70–99)

## 2021-11-13 LAB — SARS CORONAVIRUS 2 (TAT 6-24 HRS): SARS Coronavirus 2: NEGATIVE

## 2021-11-13 NOTE — Final Progress Note (Signed)
PCP: [facility] Cardiologist: denies  EKG: 09/05/21 CXR: 11/13/21 ECHO: denies Stress Test: 11/05/21 Cardiac Cath: denies  Fasting Blood Sugar- does not check glucose Checks Blood Sugar__0_ times a day  OSA/CPAP: No  ASA/Blood Thinner: No  Covid test 11/29 at PAT  Anesthesia Review: No  Patient denies shortness of breath, fever, cough, and chest pain at PAT appointment.  Patient verbalized understanding of instructions provided today at the PAT appointment.  Patient asked to review instructions at home and day of surgery.

## 2021-11-14 NOTE — Anesthesia Preprocedure Evaluation (Addendum)
Anesthesia Evaluation  Patient identified by MRN, date of birth, ID band Patient awake    Reviewed: Allergy & Precautions, NPO status , Patient's Chart, lab work & pertinent test results, reviewed documented beta blocker date and time   Airway Mallampati: I  TM Distance: >3 FB Neck ROM: Full    Dental  (+) Dental Advisory Given, Poor Dentition, Missing   Pulmonary former smoker,  lung nodule   PULMONARY MASS   Pulmonary exam normal breath sounds clear to auscultation       Cardiovascular hypertension, Pt. on home beta blockers and Pt. on medications Normal cardiovascular exam Rhythm:Regular Rate:Normal     Neuro/Psych negative neurological ROS     GI/Hepatic Neg liver ROS, GERD  Medicated,  Endo/Other  diabetes, Type 2, Oral Hypoglycemic Agents  Renal/GU negative Renal ROS     Musculoskeletal negative musculoskeletal ROS (+)   Abdominal   Peds  Hematology negative hematology ROS (+)   Anesthesia Other Findings   Reproductive/Obstetrics                            Anesthesia Physical Anesthesia Plan  ASA: 3  Anesthesia Plan: General   Post-op Pain Management: Tylenol PO (pre-op)   Induction: Intravenous  PONV Risk Score and Plan: 3 and Midazolam, Dexamethasone and Ondansetron  Airway Management Planned: Oral ETT and Double Lumen EBT  Additional Equipment:   Intra-op Plan:   Post-operative Plan: Extubation in OR  Informed Consent: I have reviewed the patients History and Physical, chart, labs and discussed the procedure including the risks, benefits and alternatives for the proposed anesthesia with the patient or authorized representative who has indicated his/her understanding and acceptance.     Dental advisory given  Plan Discussed with: CRNA  Anesthesia Plan Comments: (2 large bore PIV, Clearsight)        Anesthesia Quick Evaluation

## 2021-11-15 ENCOUNTER — Inpatient Hospital Stay (HOSPITAL_COMMUNITY): Payer: Medicare Other | Admitting: Anesthesiology

## 2021-11-15 ENCOUNTER — Encounter (HOSPITAL_COMMUNITY)
Admission: RE | Disposition: A | Discharge status: Home / Self Care | Payer: Self-pay | Source: Home / Self Care | Attending: Thoracic Surgery (Cardiothoracic Vascular Surgery)

## 2021-11-15 ENCOUNTER — Encounter (HOSPITAL_COMMUNITY): Payer: Self-pay | Admitting: Pulmonary Disease

## 2021-11-15 ENCOUNTER — Inpatient Hospital Stay (HOSPITAL_COMMUNITY): Payer: Medicare Other

## 2021-11-15 ENCOUNTER — Other Ambulatory Visit: Payer: Self-pay

## 2021-11-15 ENCOUNTER — Inpatient Hospital Stay (HOSPITAL_COMMUNITY)
Admission: RE | Admit: 2021-11-15 | Discharge: 2021-11-23 | DRG: 163 | Disposition: A | Discharge status: Home / Self Care | Payer: Medicare Other | Attending: Thoracic Surgery (Cardiothoracic Vascular Surgery) | Admitting: Thoracic Surgery (Cardiothoracic Vascular Surgery)

## 2021-11-15 DIAGNOSIS — E785 Hyperlipidemia, unspecified: Secondary | ICD-10-CM | POA: Diagnosis present

## 2021-11-15 DIAGNOSIS — J90 Pleural effusion, not elsewhere classified: Secondary | ICD-10-CM

## 2021-11-15 DIAGNOSIS — D696 Thrombocytopenia, unspecified: Secondary | ICD-10-CM | POA: Diagnosis not present

## 2021-11-15 DIAGNOSIS — R0902 Hypoxemia: Secondary | ICD-10-CM | POA: Diagnosis not present

## 2021-11-15 DIAGNOSIS — Z20822 Contact with and (suspected) exposure to covid-19: Secondary | ICD-10-CM | POA: Diagnosis present

## 2021-11-15 DIAGNOSIS — Z9889 Other specified postprocedural states: Secondary | ICD-10-CM

## 2021-11-15 DIAGNOSIS — Z419 Encounter for procedure for purposes other than remedying health state, unspecified: Secondary | ICD-10-CM

## 2021-11-15 DIAGNOSIS — R918 Other nonspecific abnormal finding of lung field: Secondary | ICD-10-CM

## 2021-11-15 DIAGNOSIS — Z7984 Long term (current) use of oral hypoglycemic drugs: Secondary | ICD-10-CM | POA: Diagnosis not present

## 2021-11-15 DIAGNOSIS — E876 Hypokalemia: Secondary | ICD-10-CM | POA: Diagnosis not present

## 2021-11-15 DIAGNOSIS — Z87891 Personal history of nicotine dependence: Secondary | ICD-10-CM | POA: Diagnosis not present

## 2021-11-15 DIAGNOSIS — N289 Disorder of kidney and ureter, unspecified: Secondary | ICD-10-CM | POA: Diagnosis not present

## 2021-11-15 DIAGNOSIS — J9811 Atelectasis: Secondary | ICD-10-CM | POA: Diagnosis not present

## 2021-11-15 DIAGNOSIS — G9341 Metabolic encephalopathy: Secondary | ICD-10-CM

## 2021-11-15 DIAGNOSIS — I9752 Accidental puncture and laceration of a circulatory system organ or structure during other procedure: Secondary | ICD-10-CM | POA: Diagnosis not present

## 2021-11-15 DIAGNOSIS — D62 Acute posthemorrhagic anemia: Secondary | ICD-10-CM | POA: Diagnosis not present

## 2021-11-15 DIAGNOSIS — R911 Solitary pulmonary nodule: Secondary | ICD-10-CM

## 2021-11-15 DIAGNOSIS — R578 Other shock: Secondary | ICD-10-CM | POA: Diagnosis not present

## 2021-11-15 DIAGNOSIS — Z79899 Other long term (current) drug therapy: Secondary | ICD-10-CM | POA: Diagnosis not present

## 2021-11-15 DIAGNOSIS — C3412 Malignant neoplasm of upper lobe, left bronchus or lung: Secondary | ICD-10-CM | POA: Diagnosis not present

## 2021-11-15 DIAGNOSIS — R942 Abnormal results of pulmonary function studies: Secondary | ICD-10-CM

## 2021-11-15 DIAGNOSIS — D72829 Elevated white blood cell count, unspecified: Secondary | ICD-10-CM | POA: Diagnosis not present

## 2021-11-15 DIAGNOSIS — Y836 Removal of other organ (partial) (total) as the cause of abnormal reaction of the patient, or of later complication, without mention of misadventure at the time of the procedure: Secondary | ICD-10-CM | POA: Diagnosis not present

## 2021-11-15 DIAGNOSIS — Z09 Encounter for follow-up examination after completed treatment for conditions other than malignant neoplasm: Secondary | ICD-10-CM

## 2021-11-15 DIAGNOSIS — E119 Type 2 diabetes mellitus without complications: Secondary | ICD-10-CM | POA: Diagnosis present

## 2021-11-15 DIAGNOSIS — Z902 Acquired absence of lung [part of]: Secondary | ICD-10-CM

## 2021-11-15 DIAGNOSIS — I951 Orthostatic hypotension: Secondary | ICD-10-CM | POA: Diagnosis present

## 2021-11-15 DIAGNOSIS — Z452 Encounter for adjustment and management of vascular access device: Secondary | ICD-10-CM

## 2021-11-15 DIAGNOSIS — I10 Essential (primary) hypertension: Secondary | ICD-10-CM | POA: Diagnosis not present

## 2021-11-15 DIAGNOSIS — Z4659 Encounter for fitting and adjustment of other gastrointestinal appliance and device: Secondary | ICD-10-CM

## 2021-11-15 DIAGNOSIS — Z01818 Encounter for other preprocedural examination: Secondary | ICD-10-CM

## 2021-11-15 HISTORY — PX: NODE DISSECTION: SHX5269

## 2021-11-15 HISTORY — PX: RIB PLATING: SHX5079

## 2021-11-15 HISTORY — PX: VIDEO BRONCHOSCOPY WITH RADIAL ENDOBRONCHIAL ULTRASOUND: SHX6849

## 2021-11-15 HISTORY — PX: BRONCHIAL BRUSHINGS: SHX5108

## 2021-11-15 HISTORY — PX: INTERCOSTAL NERVE BLOCK: SHX5021

## 2021-11-15 HISTORY — PX: MEDIASTINAL EXPLORATION: SHX5065

## 2021-11-15 HISTORY — PX: FIDUCIAL MARKER PLACEMENT: SHX6858

## 2021-11-15 HISTORY — PX: THORACOTOMY/LOBECTOMY: SHX6116

## 2021-11-15 HISTORY — PX: BRONCHIAL BIOPSY: SHX5109

## 2021-11-15 HISTORY — PX: LOBECTOMY: SHX5089

## 2021-11-15 LAB — GLUCOSE, CAPILLARY
Glucose-Capillary: 153 mg/dL — ABNORMAL HIGH (ref 70–99)
Glucose-Capillary: 230 mg/dL — ABNORMAL HIGH (ref 70–99)
Glucose-Capillary: 150 mg/dL — ABNORMAL HIGH (ref 70–99)

## 2021-11-15 LAB — BPAM PLATELET PHERESIS
Blood Product Expiration Date: 202212042359
Blood Product Expiration Date: 202212042359
ISSUE DATE / TIME: 202212011146
ISSUE DATE / TIME: 202212011221
Unit Type and Rh: 6200
Unit Type and Rh: 6200

## 2021-11-15 LAB — CBC
HCT: 32.3 % — ABNORMAL LOW (ref 39.0–52.0)
Hemoglobin: 10.5 g/dL — ABNORMAL LOW (ref 13.0–17.0)
MCH: 29.3 pg (ref 26.0–34.0)
MCHC: 32.5 g/dL (ref 30.0–36.0)
MCV: 90.2 fL (ref 80.0–100.0)
Platelets: 69 10*3/uL — ABNORMAL LOW (ref 150–400)
RBC: 3.58 MIL/uL — ABNORMAL LOW (ref 4.22–5.81)
RDW: 15.7 % — ABNORMAL HIGH (ref 11.5–15.5)
WBC: 13.2 10*3/uL — ABNORMAL HIGH (ref 4.0–10.5)
nRBC: 0 % (ref 0.0–0.2)

## 2021-11-15 LAB — POCT I-STAT 7, (LYTES, BLD GAS, ICA,H+H)
Acid-base deficit: 15 mmol/L — ABNORMAL HIGH (ref 0.0–2.0)
Acid-base deficit: 4 mmol/L — ABNORMAL HIGH (ref 0.0–2.0)
Acid-base deficit: 4 mmol/L — ABNORMAL HIGH (ref 0.0–2.0)
Bicarbonate: 15.5 mmol/L — ABNORMAL LOW (ref 20.0–28.0)
Bicarbonate: 21.4 mmol/L (ref 20.0–28.0)
Bicarbonate: 23.2 mmol/L (ref 20.0–28.0)
Calcium, Ion: 0.77 mmol/L — CL (ref 1.15–1.40)
Calcium, Ion: 0.99 mmol/L — ABNORMAL LOW (ref 1.15–1.40)
Calcium, Ion: 1.12 mmol/L — ABNORMAL LOW (ref 1.15–1.40)
HCT: 23 % — ABNORMAL LOW (ref 39.0–52.0)
HCT: 23 % — ABNORMAL LOW (ref 39.0–52.0)
HCT: 29 % — ABNORMAL LOW (ref 39.0–52.0)
Hemoglobin: 7.8 g/dL — ABNORMAL LOW (ref 13.0–17.0)
Hemoglobin: 7.8 g/dL — ABNORMAL LOW (ref 13.0–17.0)
Hemoglobin: 9.9 g/dL — ABNORMAL LOW (ref 13.0–17.0)
O2 Saturation: 98 %
O2 Saturation: 100 %
O2 Saturation: 100 %
Patient temperature: 35
Patient temperature: 35
Potassium: 4.9 mmol/L (ref 3.5–5.1)
Potassium: 5.9 mmol/L — ABNORMAL HIGH (ref 3.5–5.1)
Potassium: 6 mmol/L — ABNORMAL HIGH (ref 3.5–5.1)
Sodium: 138 mmol/L (ref 135–145)
Sodium: 138 mmol/L (ref 135–145)
Sodium: 138 mmol/L (ref 135–145)
TCO2: 17 mmol/L — ABNORMAL LOW (ref 22–32)
TCO2: 23 mmol/L (ref 22–32)
TCO2: 25 mmol/L (ref 22–32)
pCO2 arterial: 63.2 mmHg — ABNORMAL HIGH (ref 32.0–48.0)
pCO2 arterial: 34.7 mmHg (ref 32.0–48.0)
pCO2 arterial: 47.4 mmHg (ref 32.0–48.0)
pH, Arterial: 6.998 — CL (ref 7.350–7.450)
pH, Arterial: 7.287 — ABNORMAL LOW (ref 7.350–7.450)
pH, Arterial: 7.389 (ref 7.350–7.450)
pO2, Arterial: 168 mmHg — ABNORMAL HIGH (ref 83.0–108.0)
pO2, Arterial: 198 mmHg — ABNORMAL HIGH (ref 83.0–108.0)
pO2, Arterial: 322 mmHg — ABNORMAL HIGH (ref 83.0–108.0)

## 2021-11-15 LAB — PREPARE PLATELET PHERESIS
Unit division: 0
Unit division: 0

## 2021-11-15 LAB — PREPARE RBC (CROSSMATCH)

## 2021-11-15 LAB — COMPREHENSIVE METABOLIC PANEL
ALT: 42 U/L (ref 0–44)
AST: 72 U/L — ABNORMAL HIGH (ref 15–41)
Albumin: 3 g/dL — ABNORMAL LOW (ref 3.5–5.0)
Alkaline Phosphatase: 36 U/L — ABNORMAL LOW (ref 38–126)
Anion gap: 13 (ref 5–15)
BUN: 23 mg/dL (ref 8–23)
CO2: 20 mmol/L — ABNORMAL LOW (ref 22–32)
Calcium: 8.1 mg/dL — ABNORMAL LOW (ref 8.9–10.3)
Chloride: 106 mmol/L (ref 98–111)
Creatinine, Ser: 1.66 mg/dL — ABNORMAL HIGH (ref 0.61–1.24)
GFR, Estimated: 44 mL/min — ABNORMAL LOW (ref >60)
Glucose, Bld: 247 mg/dL — ABNORMAL HIGH (ref 70–99)
Potassium: 4.6 mmol/L (ref 3.5–5.1)
Sodium: 139 mmol/L (ref 135–145)
Total Bilirubin: 1.8 mg/dL — ABNORMAL HIGH (ref 0.3–1.2)
Total Protein: 4.7 g/dL — ABNORMAL LOW (ref 6.5–8.1)

## 2021-11-15 LAB — POCT I-STAT, CHEM 8
BUN: 25 mg/dL — ABNORMAL HIGH (ref 8–23)
BUN: 26 mg/dL — ABNORMAL HIGH (ref 8–23)
Calcium, Ion: 1.14 mmol/L — ABNORMAL LOW (ref 1.15–1.40)
Calcium, Ion: 1.22 mmol/L (ref 1.15–1.40)
Chloride: 102 mmol/L (ref 98–111)
Chloride: 105 mmol/L (ref 98–111)
Creatinine, Ser: 1.5 mg/dL — ABNORMAL HIGH (ref 0.61–1.24)
Creatinine, Ser: 1.6 mg/dL — ABNORMAL HIGH (ref 0.61–1.24)
Glucose, Bld: 195 mg/dL — ABNORMAL HIGH (ref 70–99)
Glucose, Bld: 205 mg/dL — ABNORMAL HIGH (ref 70–99)
HCT: 34 % — ABNORMAL LOW (ref 39.0–52.0)
HCT: 37 % — ABNORMAL LOW (ref 39.0–52.0)
Hemoglobin: 11.6 g/dL — ABNORMAL LOW (ref 13.0–17.0)
Hemoglobin: 12.6 g/dL — ABNORMAL LOW (ref 13.0–17.0)
Potassium: 5.3 mmol/L — ABNORMAL HIGH (ref 3.5–5.1)
Potassium: 6 mmol/L — ABNORMAL HIGH (ref 3.5–5.1)
Sodium: 134 mmol/L — ABNORMAL LOW (ref 135–145)
Sodium: 138 mmol/L (ref 135–145)
TCO2: 21 mmol/L — ABNORMAL LOW (ref 22–32)
TCO2: 22 mmol/L (ref 22–32)

## 2021-11-15 LAB — ABO/RH: ABO/RH(D): B POS

## 2021-11-15 LAB — MASSIVE TRANSFUSION PROTOCOL ORDER (BLOOD BANK NOTIFICATION)

## 2021-11-15 SURGERY — BRONCHOSCOPY, WITH BIOPSY USING ELECTROMAGNETIC NAVIGATION
Anesthesia: General

## 2021-11-15 SURGERY — LOBECTOMY, LUNG, ROBOT-ASSISTED, USING VATS
Anesthesia: General | Site: Chest | Laterality: Left

## 2021-11-15 MED ORDER — FENTANYL CITRATE PF 50 MCG/ML IJ SOSY: 25.0000 ug | PREFILLED_SYRINGE | INTRAMUSCULAR | Status: DC | PRN

## 2021-11-15 MED ORDER — NOREPINEPHRINE 16 MG/250ML-% IV SOLN
0.0000 ug/min | INTRAVENOUS | Status: DC
  Administered 2021-11-15: 8 ug/min via INTRAVENOUS
  Administered 2021-11-16: 2 ug/min via INTRAVENOUS
  Administered 2021-11-17: 1 ug/min via INTRAVENOUS

## 2021-11-15 MED ORDER — HEMOSTATIC AGENTS (NO CHARGE) OPTIME
TOPICAL | Status: DC | PRN
  Administered 2021-11-15 (×2): 1 via TOPICAL

## 2021-11-15 MED ORDER — SENNOSIDES-DOCUSATE SODIUM 8.6-50 MG PO TABS
1.0000 | ORAL_TABLET | Freq: Every day | ORAL | Status: DC
  Administered 2021-11-15 – 2021-11-21 (×5): 1 via ORAL
  Filled 2021-11-15 (×7): qty 1

## 2021-11-15 MED ORDER — CHLORHEXIDINE GLUCONATE 0.12 % MT SOLN: 15.0000 mL | Freq: Once | OROMUCOSAL | Status: AC

## 2021-11-15 MED ORDER — PHENYLEPHRINE HCL-NACL 20-0.9 MG/250ML-% IV SOLN
INTRAVENOUS | Status: DC | PRN
  Administered 2021-11-15: 10 ug/min via INTRAVENOUS

## 2021-11-15 MED ORDER — EPHEDRINE 5 MG/ML INJ
INTRAVENOUS | Status: AC
  Filled 2021-11-15: qty 5

## 2021-11-15 MED ORDER — LIDOCAINE 2% (20 MG/ML) 5 ML SYRINGE
INTRAMUSCULAR | Status: DC | PRN
  Administered 2021-11-15: 100 mg via INTRAVENOUS

## 2021-11-15 MED ORDER — BISACODYL 5 MG PO TBEC
10.0000 mg | DELAYED_RELEASE_TABLET | Freq: Every day | ORAL | Status: DC
  Administered 2021-11-16 – 2021-11-23 (×6): 10 mg via ORAL
  Filled 2021-11-15 (×8): qty 2

## 2021-11-15 MED ORDER — DEXAMETHASONE SODIUM PHOSPHATE 10 MG/ML IJ SOLN
INTRAMUSCULAR | Status: AC
  Filled 2021-11-15: qty 1

## 2021-11-15 MED ORDER — ACETAMINOPHEN 160 MG/5ML PO SOLN: 1000.0000 mg | Freq: Four times a day (QID) | ORAL | Status: AC

## 2021-11-15 MED ORDER — PHENYLEPHRINE 40 MCG/ML (10ML) SYRINGE FOR IV PUSH (FOR BLOOD PRESSURE SUPPORT)
PREFILLED_SYRINGE | INTRAVENOUS | Status: DC | PRN
  Administered 2021-11-15 (×3): 80 ug via INTRAVENOUS
  Administered 2021-11-15: 40 ug via INTRAVENOUS
  Administered 2021-11-15 (×2): 120 ug via INTRAVENOUS

## 2021-11-15 MED ORDER — NOREPINEPHRINE 4 MG/250ML-% IV SOLN: 0.0000 ug/min | INTRAVENOUS | Status: DC

## 2021-11-15 MED ORDER — SODIUM CHLORIDE 0.9% IV SOLUTION: Freq: Once | INTRAVENOUS | Status: AC

## 2021-11-15 MED ORDER — DOCUSATE SODIUM 50 MG/5ML PO LIQD
100.0000 mg | Freq: Two times a day (BID) | ORAL | Status: DC
  Administered 2021-11-15 – 2021-11-17 (×3): 100 mg
  Filled 2021-11-15 (×4): qty 10

## 2021-11-15 MED ORDER — DEXAMETHASONE SODIUM PHOSPHATE 10 MG/ML IJ SOLN
INTRAMUSCULAR | Status: DC | PRN
  Administered 2021-11-15: 10 mg via INTRAVENOUS

## 2021-11-15 MED ORDER — MIDAZOLAM HCL 2 MG/2ML IJ SOLN
INTRAMUSCULAR | Status: AC
  Filled 2021-11-15: qty 2

## 2021-11-15 MED ORDER — 0.9 % SODIUM CHLORIDE (POUR BTL) OPTIME
TOPICAL | Status: DC | PRN
  Administered 2021-11-15: 2000 mL

## 2021-11-15 MED ORDER — SODIUM BICARBONATE 8.4 % IV SOLN
INTRAVENOUS | Status: DC | PRN
  Administered 2021-11-15 (×2): 50 meq via INTRAVENOUS

## 2021-11-15 MED ORDER — ORAL CARE MOUTH RINSE
15.0000 mL | OROMUCOSAL | Status: DC
  Administered 2021-11-16 – 2021-11-17 (×13): 15 mL via OROMUCOSAL

## 2021-11-15 MED ORDER — PROPOFOL 500 MG/50ML IV EMUL
INTRAVENOUS | Status: DC | PRN
  Administered 2021-11-15: 90 ug/kg/min via INTRAVENOUS

## 2021-11-15 MED ORDER — SODIUM CHLORIDE 0.9 % IV SOLN: INTRAVENOUS | Status: DC

## 2021-11-15 MED ORDER — POLYETHYLENE GLYCOL 3350 17 G PO PACK
17.0000 g | PACK | Freq: Every day | ORAL | Status: DC
  Administered 2021-11-18 – 2021-11-19 (×2): 17 g
  Filled 2021-11-15 (×7): qty 1

## 2021-11-15 MED ORDER — FENTANYL CITRATE (PF) 250 MCG/5ML IJ SOLN
INTRAMUSCULAR | Status: AC
  Filled 2021-11-15: qty 5

## 2021-11-15 MED ORDER — ACETAMINOPHEN 500 MG PO TABS
1000.0000 mg | ORAL_TABLET | Freq: Once | ORAL | Status: AC
  Administered 2021-11-15: 1000 mg via ORAL
  Filled 2021-11-15: qty 2

## 2021-11-15 MED ORDER — CEFAZOLIN SODIUM-DEXTROSE 2-4 GM/100ML-% IV SOLN
2.0000 g | INTRAVENOUS | Status: AC
  Administered 2021-11-15: 2 g via INTRAVENOUS
  Filled 2021-11-15: qty 100

## 2021-11-15 MED ORDER — CEFAZOLIN SODIUM-DEXTROSE 2-4 GM/100ML-% IV SOLN
2.0000 g | Freq: Three times a day (TID) | INTRAVENOUS | Status: AC
  Administered 2021-11-15 (×2): 2 g via INTRAVENOUS
  Filled 2021-11-15 (×2): qty 100

## 2021-11-15 MED ORDER — PROPOFOL 10 MG/ML IV BOLUS
INTRAVENOUS | Status: AC
  Filled 2021-11-15: qty 20

## 2021-11-15 MED ORDER — PHENYLEPHRINE 40 MCG/ML (10ML) SYRINGE FOR IV PUSH (FOR BLOOD PRESSURE SUPPORT)
PREFILLED_SYRINGE | INTRAVENOUS | Status: AC
  Filled 2021-11-15: qty 20

## 2021-11-15 MED ORDER — VASOPRESSIN 20 UNIT/ML IV SOLN
INTRAVENOUS | Status: DC | PRN
  Administered 2021-11-15: 2 [IU] via INTRAVENOUS
  Administered 2021-11-15 (×2): 10 [IU] via INTRAVENOUS

## 2021-11-15 MED ORDER — ONDANSETRON HCL 4 MG/2ML IJ SOLN
INTRAMUSCULAR | Status: AC
  Filled 2021-11-15: qty 2

## 2021-11-15 MED ORDER — MIDAZOLAM HCL 2 MG/2ML IJ SOLN
INTRAMUSCULAR | Status: DC | PRN
  Administered 2021-11-15: 2 mg via INTRAVENOUS

## 2021-11-15 MED ORDER — BUPIVACAINE HCL (PF) 0.5 % IJ SOLN
INTRAMUSCULAR | Status: AC
  Filled 2021-11-15: qty 30

## 2021-11-15 MED ORDER — LACTATED RINGERS IV SOLN: INTRAVENOUS | Status: DC | PRN

## 2021-11-15 MED ORDER — BUPIVACAINE LIPOSOME 1.3 % IJ SUSP
INTRAMUSCULAR | Status: AC
  Filled 2021-11-15: qty 20

## 2021-11-15 MED ORDER — CALCIUM CHLORIDE 10 % IV SOLN
INTRAVENOUS | Status: DC | PRN
  Administered 2021-11-15: 300 mg via INTRAVENOUS
  Administered 2021-11-15: 600 mg via INTRAVENOUS
  Administered 2021-11-15: 100 mg via INTRAVENOUS

## 2021-11-15 MED ORDER — CHLORHEXIDINE GLUCONATE 0.12 % MT SOLN
OROMUCOSAL | Status: AC
  Administered 2021-11-15: 15 mL via OROMUCOSAL
  Filled 2021-11-15: qty 15

## 2021-11-15 MED ORDER — SUCCINYLCHOLINE CHLORIDE 200 MG/10ML IV SOSY
PREFILLED_SYRINGE | INTRAVENOUS | Status: AC
  Filled 2021-11-15: qty 20

## 2021-11-15 MED ORDER — SODIUM CHLORIDE 0.9% IV SOLUTION: Freq: Once | INTRAVENOUS | Status: DC

## 2021-11-15 MED ORDER — FENTANYL CITRATE (PF) 100 MCG/2ML IJ SOLN: 25.0000 ug | INTRAMUSCULAR | Status: DC | PRN

## 2021-11-15 MED ORDER — FENTANYL CITRATE PF 50 MCG/ML IJ SOSY
25.0000 ug | PREFILLED_SYRINGE | INTRAMUSCULAR | Status: DC | PRN
  Administered 2021-11-16: 100 ug via INTRAVENOUS
  Administered 2021-11-16: 50 ug via INTRAVENOUS
  Filled 2021-11-15: qty 1
  Filled 2021-11-15: qty 2

## 2021-11-15 MED ORDER — ALBUMIN HUMAN 5 % IV SOLN: INTRAVENOUS | Status: DC | PRN

## 2021-11-15 MED ORDER — FENTANYL CITRATE (PF) 250 MCG/5ML IJ SOLN
INTRAMUSCULAR | Status: DC | PRN
  Administered 2021-11-15: 50 ug via INTRAVENOUS
  Administered 2021-11-15 (×3): 100 ug via INTRAVENOUS
  Administered 2021-11-15: 50 ug via INTRAVENOUS
  Administered 2021-11-15: 100 ug via INTRAVENOUS

## 2021-11-15 MED ORDER — EPINEPHRINE 1 MG/10ML IJ SOSY
PREFILLED_SYRINGE | INTRAMUSCULAR | Status: DC | PRN
  Administered 2021-11-15 (×5): 200 ug via INTRAVENOUS

## 2021-11-15 MED ORDER — ONDANSETRON HCL 4 MG/2ML IJ SOLN: 4.0000 mg | Freq: Four times a day (QID) | INTRAMUSCULAR | Status: DC | PRN

## 2021-11-15 MED ORDER — LACTATED RINGERS IV SOLN: INTRAVENOUS | Status: DC

## 2021-11-15 MED ORDER — TRAMADOL HCL 50 MG PO TABS
50.0000 mg | ORAL_TABLET | Freq: Four times a day (QID) | ORAL | Status: DC | PRN
  Administered 2021-11-15 – 2021-11-22 (×13): 100 mg via ORAL
  Filled 2021-11-15 (×13): qty 2

## 2021-11-15 MED ORDER — ORAL CARE MOUTH RINSE: 15.0000 mL | Freq: Once | OROMUCOSAL | Status: AC

## 2021-11-15 MED ORDER — ROCURONIUM BROMIDE 10 MG/ML (PF) SYRINGE
PREFILLED_SYRINGE | INTRAVENOUS | Status: DC | PRN
  Administered 2021-11-15: 100 mg via INTRAVENOUS
  Administered 2021-11-15: 70 mg via INTRAVENOUS
  Administered 2021-11-15: 20 mg via INTRAVENOUS
  Administered 2021-11-15: 40 mg via INTRAVENOUS
  Administered 2021-11-15 (×2): 50 mg via INTRAVENOUS
  Administered 2021-11-15: 30 mg via INTRAVENOUS
  Administered 2021-11-15: 50 mg via INTRAVENOUS
  Administered 2021-11-15 (×2): 30 mg via INTRAVENOUS

## 2021-11-15 MED ORDER — DEXMEDETOMIDINE HCL IN NACL 400 MCG/100ML IV SOLN
0.0000 ug/kg/h | INTRAVENOUS | Status: DC
  Administered 2021-11-15: 0.4 ug/kg/h via INTRAVENOUS
  Administered 2021-11-15: 0.7 ug/kg/h via INTRAVENOUS
  Administered 2021-11-16: 0.3 ug/kg/h via INTRAVENOUS
  Filled 2021-11-15: qty 200
  Filled 2021-11-15: qty 100

## 2021-11-15 MED ORDER — NOREPINEPHRINE 4 MG/250ML-% IV SOLN: INTRAVENOUS | Status: DC | PRN

## 2021-11-15 MED ORDER — CHLORHEXIDINE GLUCONATE CLOTH 2 % EX PADS
6.0000 | MEDICATED_PAD | Freq: Every day | CUTANEOUS | Status: DC
  Administered 2021-11-15 – 2021-11-23 (×9): 6 via TOPICAL

## 2021-11-15 MED ORDER — INSULIN ASPART 100 UNIT/ML IJ SOLN
0.0000 [IU] | INTRAMUSCULAR | Status: DC
Start: 2021-11-15 — End: 2021-11-23
  Administered 2021-11-15: 8 [IU] via SUBCUTANEOUS
  Administered 2021-11-16 – 2021-11-17 (×7): 2 [IU] via SUBCUTANEOUS
  Administered 2021-11-17: 4 [IU] via SUBCUTANEOUS
  Administered 2021-11-17 (×3): 2 [IU] via SUBCUTANEOUS
  Administered 2021-11-18: 01:00:00 0 [IU] via SUBCUTANEOUS
  Administered 2021-11-18: 21:00:00 3 [IU] via SUBCUTANEOUS
  Administered 2021-11-18 – 2021-11-21 (×9): 2 [IU] via SUBCUTANEOUS
  Administered 2021-11-21: 4 [IU] via SUBCUTANEOUS
  Administered 2021-11-21: 2 [IU] via SUBCUTANEOUS
  Administered 2021-11-21: 4 [IU] via SUBCUTANEOUS
  Administered 2021-11-22 (×2): 2 [IU] via SUBCUTANEOUS
  Administered 2021-11-22: 4 [IU] via SUBCUTANEOUS
  Administered 2021-11-23: 2 [IU] via SUBCUTANEOUS

## 2021-11-15 MED ORDER — CHLORHEXIDINE GLUCONATE 0.12% ORAL RINSE (MEDLINE KIT)
15.0000 mL | Freq: Two times a day (BID) | OROMUCOSAL | Status: DC
  Administered 2021-11-16 – 2021-11-17 (×2): 15 mL via OROMUCOSAL

## 2021-11-15 MED ORDER — PROPOFOL 10 MG/ML IV BOLUS
INTRAVENOUS | Status: DC | PRN
  Administered 2021-11-15: 140 mg via INTRAVENOUS
  Administered 2021-11-15: 60 mg via INTRAVENOUS

## 2021-11-15 MED ORDER — EPHEDRINE SULFATE-NACL 50-0.9 MG/10ML-% IV SOSY
PREFILLED_SYRINGE | INTRAVENOUS | Status: DC | PRN
  Administered 2021-11-15 (×3): 5 mg via INTRAVENOUS

## 2021-11-15 MED ORDER — ACETAMINOPHEN 500 MG PO TABS
1000.0000 mg | ORAL_TABLET | Freq: Four times a day (QID) | ORAL | Status: AC
  Administered 2021-11-15 – 2021-11-19 (×15): 1000 mg via ORAL
  Filled 2021-11-15 (×16): qty 2

## 2021-11-15 MED ORDER — METHYLENE BLUE 0.5 % INJ SOLN
INTRAVENOUS | Status: DC | PRN
  Administered 2021-11-15: 1.5 mL

## 2021-11-15 MED ORDER — LIDOCAINE 2% (20 MG/ML) 5 ML SYRINGE
INTRAMUSCULAR | Status: AC
  Filled 2021-11-15: qty 5

## 2021-11-15 MED ORDER — ROCURONIUM BROMIDE 10 MG/ML (PF) SYRINGE
PREFILLED_SYRINGE | INTRAVENOUS | Status: AC
  Filled 2021-11-15: qty 40

## 2021-11-15 MED ORDER — NOREPINEPHRINE 16 MG/250ML-% IV SOLN
0.0000 ug/min | INTRAVENOUS | Status: DC
  Administered 2021-11-15: 10 ug/min via INTRAVENOUS

## 2021-11-15 MED ORDER — ONDANSETRON HCL 4 MG/2ML IJ SOLN: 4.0000 mg | Freq: Once | INTRAMUSCULAR | Status: DC | PRN

## 2021-11-15 MED ORDER — SODIUM CHLORIDE FLUSH 0.9 % IV SOLN
INTRAVENOUS | Status: DC | PRN
  Administered 2021-11-15: 100 mL

## 2021-11-15 SURGICAL SUPPLY — 138 items
ADH SKN CLS APL DERMABOND .7 (GAUZE/BANDAGES/DRESSINGS) ×1
ADH SKN CLS LQ APL DERMABOND (GAUZE/BANDAGES/DRESSINGS) ×1
APL PRP STRL LF DISP 70% ISPRP (MISCELLANEOUS) ×1
APPLIER CLIP ROT 10 11.4 M/L (STAPLE) ×3
APR CLP MED LRG 11.4X10 (STAPLE) ×1
BATTERY MAXDRIVER (MISCELLANEOUS) ×6 IMPLANT
BLADE CLIPPER SURG (BLADE) ×3 IMPLANT
BLADE SURG 11 STRL SS (BLADE) ×3 IMPLANT
BLADE SURG 15 STRL LF DISP TIS (BLADE) ×1 IMPLANT
BLADE SURG 15 STRL SS (BLADE) ×3
BNDG COHESIVE 6X5 TAN STRL LF (GAUZE/BANDAGES/DRESSINGS) ×3 IMPLANT
CANISTER SUCT 3000ML PPV (MISCELLANEOUS) ×6 IMPLANT
CANNULA REDUC XI 12-8 STAPL (CANNULA) ×4
CANNULA REDUC XI 12-8MM STAPL (CANNULA) ×2
CANNULA REDUCER 12-8 DVNC XI (CANNULA) ×2 IMPLANT
CATH THORACIC 28FR (CATHETERS) ×3 IMPLANT
CATH TROCAR 20FR (CATHETERS) IMPLANT
CHLORAPREP W/TINT 26 (MISCELLANEOUS) ×3 IMPLANT
CLIP APPLIE ROT 10 11.4 M/L (STAPLE) ×1 IMPLANT
CLIP TI MEDIUM 6 (CLIP) ×3 IMPLANT
CLIP VESOCCLUDE MED 6/CT (CLIP) IMPLANT
CNTNR URN SCR LID CUP LEK RST (MISCELLANEOUS) ×9 IMPLANT
CONN ST 1/4X3/8  BEN (MISCELLANEOUS)
CONN ST 1/4X3/8 BEN (MISCELLANEOUS) IMPLANT
CONT SPEC 4OZ STRL OR WHT (MISCELLANEOUS) ×27
DEFOGGER SCOPE WARMER CLEARIFY (MISCELLANEOUS) ×3 IMPLANT
DERMABOND ADHESIVE PROPEN (GAUZE/BANDAGES/DRESSINGS) ×2
DERMABOND ADVANCED (GAUZE/BANDAGES/DRESSINGS) ×2
DERMABOND ADVANCED .7 DNX12 (GAUZE/BANDAGES/DRESSINGS) ×1 IMPLANT
DERMABOND ADVANCED .7 DNX6 (GAUZE/BANDAGES/DRESSINGS) ×1 IMPLANT
DISSECTOR BLUNT TIP ENDO 5MM (MISCELLANEOUS) IMPLANT
DRAIN CHANNEL 28F RND 3/8 FF (WOUND CARE) IMPLANT
DRAPE ARM DVNC X/XI (DISPOSABLE) ×4 IMPLANT
DRAPE COLUMN DVNC XI (DISPOSABLE) ×1 IMPLANT
DRAPE CV SPLIT W-CLR ANES SCRN (DRAPES) ×3 IMPLANT
DRAPE DA VINCI XI ARM (DISPOSABLE) ×12
DRAPE DA VINCI XI COLUMN (DISPOSABLE) ×3
DRAPE ORTHO SPLIT 77X108 STRL (DRAPES) ×3
DRAPE SURG ORHT 6 SPLT 77X108 (DRAPES) ×1 IMPLANT
ELECT BLADE 6.5 EXT (BLADE) ×3 IMPLANT
ELECT REM PT RETURN 9FT ADLT (ELECTROSURGICAL) ×3
ELECTRODE REM PT RTRN 9FT ADLT (ELECTROSURGICAL) ×1 IMPLANT
FELT TEFLON 1X6 (MISCELLANEOUS) ×3 IMPLANT
GAUZE 4X4 16PLY ~~LOC~~+RFID DBL (SPONGE) ×3 IMPLANT
GAUZE KITTNER 4X5 RF (MISCELLANEOUS) ×3 IMPLANT
GAUZE KITTNER 4X8 (MISCELLANEOUS) ×3 IMPLANT
GAUZE SPONGE 4X4 12PLY STRL (GAUZE/BANDAGES/DRESSINGS) ×3 IMPLANT
GLOVE SURG ENC MOIS LTX SZ7.5 (GLOVE) ×6 IMPLANT
GLOVE SURG POLYISO LF SZ8 (GLOVE) ×3 IMPLANT
GOWN STRL REUS W/ TWL LRG LVL3 (GOWN DISPOSABLE) ×2 IMPLANT
GOWN STRL REUS W/ TWL XL LVL3 (GOWN DISPOSABLE) ×3 IMPLANT
GOWN STRL REUS W/TWL 2XL LVL3 (GOWN DISPOSABLE) ×3 IMPLANT
GOWN STRL REUS W/TWL LRG LVL3 (GOWN DISPOSABLE) ×6
GOWN STRL REUS W/TWL XL LVL3 (GOWN DISPOSABLE) ×9
HANDLE STAPLE  ENDO EGIA 4 STD (STAPLE) ×3
HANDLE STAPLE ENDO EGIA 4 STD (STAPLE) ×1 IMPLANT
HEMOSTAT SURGICEL 2X14 (HEMOSTASIS) ×9 IMPLANT
IRRIGATION STRYKERFLOW (MISCELLANEOUS) IMPLANT
IRRIGATOR STRYKERFLOW (MISCELLANEOUS)
IRRIGATOR SUCT 8 DISP DVNC XI (IRRIGATION / IRRIGATOR) ×1 IMPLANT
IRRIGATOR SUCTION 8MM XI DISP (IRRIGATION / IRRIGATOR) ×3
KIT BASIN OR (CUSTOM PROCEDURE TRAY) ×3 IMPLANT
KIT TURNOVER KIT B (KITS) ×3 IMPLANT
NEEDLE 22X1 1/2 (OR ONLY) (NEEDLE) ×3 IMPLANT
NS IRRIG 1000ML POUR BTL (IV SOLUTION) ×9 IMPLANT
PACK CHEST (CUSTOM PROCEDURE TRAY) ×3 IMPLANT
PAD ARMBOARD 7.5X6 YLW CONV (MISCELLANEOUS) ×15 IMPLANT
PENCIL BUTTON HOLSTER BLD 10FT (ELECTRODE) ×3 IMPLANT
PLATE RIB LOCKING UNI 14HOLE (Plate) ×3 IMPLANT
PLATE RIB X SHAPE 20HOLE (Plate) ×3 IMPLANT
PORT ACCESS TROCAR AIRSEAL 12 (TROCAR) ×1 IMPLANT
PORT ACCESS TROCAR AIRSEAL 5M (TROCAR) ×2
POUCH ENDO CATCH II 15MM (MISCELLANEOUS) IMPLANT
RELOAD STAPLER 2.5X45 WHT DVNC (STAPLE) ×6 IMPLANT
RELOAD STAPLER 3.5X45 BLU DVNC (STAPLE) ×6 IMPLANT
RELOAD STAPLER 4.3X45 GRN DVNC (STAPLE) ×1 IMPLANT
RELOAD TRI 2.0 60 XTHK VAS SUL (STAPLE) ×6 IMPLANT
RELOAD TRI 60 ART MED THCK BLK (STAPLE) IMPLANT
RETRACTOR WOUND ALXS 19CM XSML (INSTRUMENTS) IMPLANT
RTRCTR WOUND ALEXIS 19CM XSML (INSTRUMENTS)
SCISSORS LAP 5X35 DISP (ENDOMECHANICALS) IMPLANT
SCREW BONE LOCKING 2.3X9 (Screw) ×33 IMPLANT
SEAL CANN UNIV 5-8 DVNC XI (MISCELLANEOUS) ×2 IMPLANT
SEAL XI 5MM-8MM UNIVERSAL (MISCELLANEOUS) ×6
SEALANT PROGEL (MISCELLANEOUS) IMPLANT
SEALER LIGASURE MARYLAND 30 (ELECTROSURGICAL) IMPLANT
SET BERKELEY SUCTION TUBING (SUCTIONS) ×3 IMPLANT
SET TRI-LUMEN FLTR TB AIRSEAL (TUBING) ×3 IMPLANT
SOLUTION ELECTROLUBE (MISCELLANEOUS) IMPLANT
SPONGE INTESTINAL PEANUT (DISPOSABLE) IMPLANT
SPONGE T-LAP 18X18 ~~LOC~~+RFID (SPONGE) ×6 IMPLANT
SPONGE TONSIL 1 RF SGL (DISPOSABLE) ×3 IMPLANT
SPONGE TONSIL TAPE 1 RFD (DISPOSABLE) IMPLANT
STAPLE RELOAD 45 2.0 GRAY (STAPLE) ×3
STAPLE RELOAD 45 2.0 GRAY DVNC (STAPLE) ×1 IMPLANT
STAPLER 45 SUREFORM CVD (STAPLE) ×9
STAPLER 45 SUREFORM CVD DVNC (STAPLE) ×3 IMPLANT
STAPLER CANNULA SEAL DVNC XI (STAPLE) ×2 IMPLANT
STAPLER CANNULA SEAL XI (STAPLE) ×6
STAPLER RELOAD 2.5X45 WHITE (STAPLE) ×18
STAPLER RELOAD 2.5X45 WHT DVNC (STAPLE) ×6
STAPLER RELOAD 3.5X45 BLU DVNC (STAPLE) ×6
STAPLER RELOAD 3.5X45 BLUE (STAPLE) ×18
STAPLER RELOAD 4.3X45 GREEN (STAPLE) ×3
STAPLER RELOAD 4.3X45 GRN DVNC (STAPLE) ×1
STOPCOCK 4 WAY LG BORE MALE ST (IV SETS) ×3 IMPLANT
SUT MNCRL AB 3-0 PS2 18 (SUTURE) ×6 IMPLANT
SUT MON AB 2-0 CT1 36 (SUTURE) IMPLANT
SUT PDS AB 1 CTX 36 (SUTURE) ×6 IMPLANT
SUT PROLENE 4 0 RB 1 (SUTURE) ×12
SUT PROLENE 4 0 SH DA (SUTURE) ×15 IMPLANT
SUT PROLENE 4-0 RB1 .5 CRCL 36 (SUTURE) ×4 IMPLANT
SUT SILK  1 MH (SUTURE) ×3
SUT SILK 1 MH (SUTURE) ×1 IMPLANT
SUT SILK 1 TIES 10X30 (SUTURE) IMPLANT
SUT SILK 2 0 SH (SUTURE) IMPLANT
SUT SILK 2 0SH CR/8 30 (SUTURE) IMPLANT
SUT VIC AB 1 CTX 36 (SUTURE) ×6
SUT VIC AB 1 CTX36XBRD ANBCTR (SUTURE) ×2 IMPLANT
SUT VIC AB 2-0 CT1 27 (SUTURE) ×3
SUT VIC AB 2-0 CT1 TAPERPNT 27 (SUTURE) ×1 IMPLANT
SUT VIC AB 2-0 CTX 36 (SUTURE) ×6 IMPLANT
SUT VIC AB 3-0 SH 27 (SUTURE) ×6
SUT VIC AB 3-0 SH 27X BRD (SUTURE) ×2 IMPLANT
SUT VICRYL 0 TIES 12 18 (SUTURE) ×3 IMPLANT
SUT VICRYL 0 UR6 27IN ABS (SUTURE) ×6 IMPLANT
SUT VICRYL 2 TP 1 (SUTURE) ×3 IMPLANT
SYR 10ML LL (SYRINGE) ×3 IMPLANT
SYR 20ML LL LF (SYRINGE) ×3 IMPLANT
SYR 50ML LL SCALE MARK (SYRINGE) ×3 IMPLANT
SYSTEM SAHARA CHEST DRAIN ATS (WOUND CARE) ×3 IMPLANT
TAPE CLOTH 4X10 WHT NS (GAUZE/BANDAGES/DRESSINGS) ×3 IMPLANT
TAPE CLOTH SURG 4X10 WHT LF (GAUZE/BANDAGES/DRESSINGS) ×3 IMPLANT
TIP APPLICATOR SPRAY EXTEND 16 (VASCULAR PRODUCTS) IMPLANT
TOWEL GREEN STERILE (TOWEL DISPOSABLE) ×3 IMPLANT
TRAY FOLEY MTR SLVR 16FR STAT (SET/KITS/TRAYS/PACK) ×3 IMPLANT
TUBING EXTENTION W/L.L. (IV SETS) ×3 IMPLANT
YANKAUER SUCT BULB TIP NO VENT (SUCTIONS) ×3 IMPLANT

## 2021-11-15 NOTE — H&P (Signed)
Synopsis: Referred in December 2022 for lung nodule by No ref. provider found  Subjective:   PATIENT ID: [name] GENDER: male DOB: 1951-01-26, MRN: [id number]  Chief complaint, ready for surgery   [name], history of diabetes hypertension.  Presents with an abnormal CT imaging.  Patient was found to have a spiculated left upper lobe nodule concerning for malignancy.  Patient had a PET scan showing that it was PET avid.  Patient has met with cardiothoracic surgery to discuss lobectomy.  Pulmonary was consulted for robotic assisted navigational bronchoscopy and fiducial dye marking.   Past Medical History:  Diagnosis Date   Diabetes mellitus ([place])    Dyslipidemia    Hypertension      Family History  Problem Relation Age of Onset   Mental illness Mother      Past Surgical History:  Procedure Laterality Date   INGUINAL HERNIA REPAIR      Social History   Socioeconomic History   Marital status: Married    Spouse name: Not on file   Number of children: Not on file   Years of education: Not on file   Highest education level: Not on file  Occupational History   Not on file  Tobacco Use   Smoking status: Former    Packs/day: 1.00    Years: 47.00    Pack years: 47.00    Types: Cigarettes    Quit date: 08/29/2021    Years since quitting: 0.2   Smokeless tobacco: Never  Vaping Use   Vaping Use: Never used  Substance and Sexual Activity   Alcohol use: Yes    Alcohol/week: 2.0 standard drinks    Types: 2 Glasses of wine per week   Drug use: Never   Sexual activity: Not on file  Other Topics Concern   Not on file  Social History Narrative   Not on file   Social Determinants of Health   Financial Resource Strain: Not on file  Food Insecurity: Not on file  Transportation Needs: Not on file  Physical Activity: Not on file  Stress: Not on file  Social Connections: Not on file  Intimate Partner Violence: Not on file     No  Known Allergies   @ENCMEDSTART @  Review of Systems  Constitutional:  Negative for chills, fever, malaise/fatigue and weight loss.  HENT:  Negative for hearing loss, sore throat and tinnitus.   Eyes:  Negative for blurred vision and double vision.  Respiratory:  Negative for cough, hemoptysis, sputum production, shortness of breath, wheezing and stridor.   Cardiovascular:  Negative for chest pain, palpitations, orthopnea, leg swelling and PND.  Gastrointestinal:  Negative for abdominal pain, constipation, diarrhea, heartburn, nausea and vomiting.  Genitourinary:  Negative for dysuria, hematuria and urgency.  Musculoskeletal:  Negative for joint pain and myalgias.  Skin:  Negative for itching and rash.  Neurological:  Negative for dizziness, tingling, weakness and headaches.  Endo/Heme/Allergies:  Negative for environmental allergies. Does not bruise/bleed easily.  Psychiatric/Behavioral:  Negative for depression. The patient is not nervous/anxious and does not have insomnia.   All other systems reviewed and are negative.   Objective:  Physical Exam Vitals reviewed.  Constitutional:      General: He is not in acute distress.    Appearance: He is well-developed.  HENT:     Head: Normocephalic and atraumatic.  Eyes:     General: No scleral icterus.    Conjunctiva/sclera: Conjunctivae normal.     Pupils:  Pupils are equal, round, and reactive to light.  Neck:     Vascular: No JVD.     Trachea: No tracheal deviation.  Cardiovascular:     Rate and Rhythm: Normal rate and regular rhythm.     Heart sounds: Normal heart sounds. No murmur heard. Pulmonary:     Effort: Pulmonary effort is normal. No tachypnea, accessory muscle usage or respiratory distress.     Breath sounds: Normal breath sounds. No stridor. No wheezing, rhonchi or rales.  Abdominal:     General: There is no distension.     Palpations: Abdomen is soft.     Tenderness: There is no abdominal tenderness.   Musculoskeletal:        General: No tenderness.     Cervical back: Neck supple.  Lymphadenopathy:     Cervical: No cervical adenopathy.  Skin:    General: Skin is warm and dry.     Capillary Refill: Capillary refill takes less than 2 seconds.     Findings: No rash.  Neurological:     Mental Status: He is alert and oriented to person, place, and time.  Psychiatric:        Behavior: Behavior normal.     Vitals:   11/15/21 0555 11/15/21 0633  BP: (!) 190/104 (!) 149/88  Pulse: 72   Resp: 18   Temp: 98.3 F (36.8 C)   TempSrc: Oral   SpO2: 98%   Weight: 89.8 kg   Height: 5' 11"  (1.803 m)    98% on RA BMI Readings from Last 3 Encounters:  11/15/21 27.62 kg/m  11/13/21 27.64 kg/m  11/05/21 27.20 kg/m   Wt Readings from Last 3 Encounters:  11/15/21 89.8 kg  11/13/21 89.9 kg  11/05/21 88.5 kg     CBC    Component Value Date/Time   WBC 8.5 11/13/2021 1000   RBC 4.38 11/13/2021 1000   HGB 13.8 11/13/2021 1000   HGB 13.7 09/12/2021 1350   HCT 41.1 11/13/2021 1000   PLT 200 11/13/2021 1000   PLT 190 09/12/2021 1350   MCV 93.8 11/13/2021 1000   MCH 31.5 11/13/2021 1000   MCHC 33.6 11/13/2021 1000   RDW 13.0 11/13/2021 1000   LYMPHSABS 3.6 09/12/2021 1350   MONOABS 0.9 09/12/2021 1350   EOSABS 0.3 09/12/2021 1350   BASOSABS 0.1 09/12/2021 1350    Chest Imaging: 11/05/2021 CT chest: Left upper lobe spiculated nodule concerning for malignancy. The patient's images have been independently reviewed by me.    Pulmonary Functions Testing Results: PFT Results Latest Ref Rng & Units 10/17/2021  FVC-Pre L 4.59  FVC-Predicted Pre % 99  FVC-Post L 4.87  FVC-Predicted Post % 105  Pre FEV1/FVC % % 72  Post FEV1/FCV % % 75  FEV1-Pre L 3.32  FEV1-Predicted Pre % 97  FEV1-Post L 3.65  DLCO uncorrected ml/min/mmHg 19.00  DLCO UNC% % 70  DLVA Predicted % 68  TLC L 8.59  TLC % Predicted % 118  RV % Predicted % 149    FeNO:   Pathology:   Echocardiogram:    Heart Catheterization:     Assessment & Plan:     ICD-10-CM   1. Left upper lobe pulmonary nodule  R91.1 Informed Consent Details: Physician/Practitioner Attestation; Transcribe to consent form and obtain patient signature    Initiate Pre-op Protocol    Diet NPO time specified    Pre-admission testing diagnosis    Verify informed consent    Verify: history and physical is on  the chart    Verify: blood consent signed    Verify: type & screen is active for day of surgery or if previously ordered blood products prepared in blood bank    Patient education (specify): Use anesthesia standing orders to instruct patient on medications to take pre-op    Patient education (specify): Incentive Spirometry instructions to VATS patients    Lab instructions    Confirm: 12 lead EKG completed withing one month of surgery. If not current obtain per standing orders    Confirm: PA and Lateral CXR completed within previous 72 hours.  Films obtained on Friday are acceptable for Monday and Tuesday cases. If not current obtain per standing orders    Notify physician (specify)    Pneumatic SCD boots to accompany all patients to O.R.    Prep / Clip    ceFAZolin (ANCEF) IVPB 2g/100 mL premix    Informed Consent Details: Physician/Practitioner Attestation; Transcribe to consent form and obtain patient signature    Informed Consent Details: Physician/Practitioner Attestation; Transcribe to consent form and obtain patient signature    Initiate Pre-op Protocol    Pre-admission testing diagnosis    Verify informed consent    Verify: history and physical is on the chart    Verify: blood consent signed    Verify: type & screen is active for day of surgery or if previously ordered blood products prepared in blood bank    Patient education (specify): Use anesthesia standing orders to instruct patient on medications to take pre-op    Patient education (specify): Incentive Spirometry instructions to VATS patients     Lab instructions    Confirm: 12 lead EKG completed withing one month of surgery. If not current obtain per standing orders    Confirm: PA and Lateral CXR completed within previous 72 hours.  Films obtained on Friday are acceptable for Monday and Tuesday cases. If not current obtain per standing orders    Notify physician (specify)    Pneumatic SCD boots to accompany all patients to O.R.    Prep / Clip    Informed Consent Details: Physician/Practitioner Attestation; Transcribe to consent form and obtain patient signature    2. Surgery, elective  Z41.9 DG C-ARM BRONCHOSCOPY    DG C-ARM BRONCHOSCOPY      Discussion:  Left upper lobe nodule, spiculated margins concerning for primary bronchogenic carcinoma  Plan: Discussed with patient today regarding robotic assisted bronchoscopy with tissue sampling and fiducial dye marking.    Current Facility-Administered Medications:    ceFAZolin (ANCEF) IVPB 2g/100 mL premix, 2 g, Intravenous, 30 min Pre-Op, Lightfoot, Lucile Crater, MD   lactated ringers infusion, , Intravenous, Continuous, Gifford Shave, Lynnae January, MD   Garner Nash, DO [facility] 11/15/2021 7:13 AM

## 2021-11-15 NOTE — Progress Notes (Addendum)
[facility] Progress Note Patient Name: [name] DOB: [birth year] MRN: [id number]   Date of Service  11/15/2021  HPI/Events of Note  Awake and did well with SBT. Minimal chest tube output.  eICU Interventions  extubation     Intervention Category Major Interventions: Airway management  Mauri Brooklyn, P 11/15/2021, 10:46 PM

## 2021-11-15 NOTE — Progress Notes (Signed)
      [address]       [place] [address]             [phone]      Intubated, sedated  BP (!) 146/111   Pulse 75   Temp 97.8 F (36.6 C) (Oral)   Resp 19   Ht 5\' 11"  (1.803 m)   Wt 89.8 kg   SpO2 100%   BMI 27.61 kg/m  PRVC 12/50%/5 PEEP 7.39/35/322 On norepi @ 8  Intake/Output Summary (Last 24 hours) at 11/15/2021 1720 Last data filed at 11/15/2021 1700 Gross per 24 hour  Intake 9483.19 ml  Output 2315 ml  Net 7168.19 ml   CT 300 ml since ICU (600 total) Hct 32 Plt 69K- will transfuse 1 bag of platelets  Remo Lipps C. Roxan Hockey, MD [facility] [phone]

## 2021-11-15 NOTE — Anesthesia Procedure Notes (Signed)
Procedure Name: Intubation Date/Time: 11/15/2021 1:48 PM Performed by: Lorie Phenix, CRNA Pre-anesthesia Checklist: Patient identified, Emergency Drugs available, Suction available, Patient being monitored and Timeout performed Oxygen Delivery Method: Circle system utilized Preoxygenation: Pre-oxygenation with 100% oxygen Induction Type: Inhalational induction with existing ETT Laryngoscope Size: Glidescope and 4 Grade View: Grade I Tube size: 8.0 mm Airway Equipment and Method: Video-laryngoscopy and Stylet (cook catheter tube exchange with glidescope) Placement Confirmation: positive ETCO2, breath sounds checked- equal and bilateral and ETT inserted through vocal cords under direct vision Secured at: 24 cm Tube secured with: Tape Dental Injury: Teeth and Oropharynx as per pre-operative assessment

## 2021-11-15 NOTE — Discharge Summary (Signed)
Physician Discharge Summary       [address]       [place] [address]             [phone]    Patient ID: [name] MRN: [id number] DOB/AGE: [birth year] 70 y.o.  Admit date: 11/15/2021 Discharge date: 11/23/2021  Admission Diagnoses:   Left upper lobe lung mass Type 2 diabetes mellitus Dyslipidemia Hypertension  Discharge Diagnoses:   Adenocarcinoma left upper lung lobe Type 2 diabetes mellitus Dyslipidemia Hypertension Hypoxia   Consults: pulmonary/intensive care  PATH: pT1c, pN2 adenocarcinoma left upper lobe   Procedures: surgery Video Bronchoscopy with Robotic Assisted Bronchoscopic Navigation    Date of Operation: 11/15/2021    Pre-op Diagnosis: lung nodule    Post-op Diagnosis: lung nodule    Surgeon: Garner Nash, DO    Assistants: None    Anesthesia: General endotracheal anesthesia   Operation: Flexible video fiberoptic bronchoscopy with robotic assistance and biopsies.   Estimated Blood Loss: Minimal   Complications: None    54/04/6255   Patient:  [name] Pre-Op Dx: Left upper lobe pulmonary nodule Post-op Dx: Same Procedure: - Robotic assisted left video thoracoscopy -Emergency thoracotomy -Left upper lobectomy - Mediastinal lymph node sampling - Intercostal nerve block - Rib plating x2   Surgeon and Role:      * Lajuana Matte, MD - Primary    * Dr. Roxan Hockey, MD -assisting with control of pulmonary artery bleeding   History of Present Illness:   At time of surgical consultation [name] 70 y.o. male referred by Dr. Julien Nordmann for surgical evaluation of a 3.1 cm PET avid left upper lobe pulmonary mass.  This was found incidentally when he presented to the emergency department complaining of some chest and back pain.  Imaging was consistent with some rib fractures as well as this 3.1 cm pulmonary mass.  He has undergone an MRI brain which is shown no evidence of  metastatic disease, as well as a PET/CT which shows significant avidity in the pulmonary mass.  He has no hilar or mediastinal lymph uptake.  Dr. Kipp Brood reviewed the patient and all pertinent studies and recommended proceeding with resection.  He was admitted this hospitalization for the procedure.  Hospital course:  The patient was admitted and taken to the operating room on 11/15/2021  He underwent the following procedure:  Procedures: - Robotic assisted left video thoracoscopy -Emergency thoracotomy -Left upper lobectomy - Mediastinal lymph node sampling - Intercostal nerve block - Rib plating x2 Emergency thoracotomy was performed secondary to pulmonary artery injury in the lingular branch.  Postoperative hospital course: The patient was transferred to the SICU and PCCM consultation was obtained to assist with management..  Patient was extubated the evening of surgery.  He has remained on Levophed for blood pressure assistance.  Hemorrhagic shock was fully corrected.  He has remained neurologically intact.  Creatinine has remained stable and he has adequate urine output.  He does have some chronic renal insufficiency.  He was placed on a PCA on postop day 1 for assistance with pain management.  He has had additional postoperative packed cell transfusions.  He was placed on midodrine to assist with some postoperative orthostasis.  Creatinine has continued to downtrend.  He is transferred to the floor on postoperative day #3.  Chest tube was kept in place till postop day #4. Follow up CXR showed a left loculated hydropneumothorax along with significant atelectasis.  This was treated with aggressive  pulmonary hygiene, Mucinex, and mucomyst nebulizers.  Oxygenation improved so that he was able to maintain O2 sats of 95% on RA while walking in the hall.  Pain has shown good and gradual improvement over time.  His left chest incision had expected bruising but was dry and healing with no sign of  complication.     Latest Vital Signs: Blood pressure (!) 152/101, pulse 100, temperature 97.9 F (36.6 C), temperature source Oral, resp. rate 19, height 5\' 11"  (1.803 m), weight 86.7 kg, SpO2 94 %.  Physical Exam:  General appearance: alert, cooperative, and no distress Heart: Mild sinus tachycardia  lungs: clear on the right, diminished on the left.  Chest x-ray about the same. Abdomen: benign Extremities: no edema Wound: left chest dressing is dry, expected bruising left lateral chest  Discharge Condition: Stable  Recent laboratory studies:  Lab Results  Component Value Date   WBC 8.1 11/23/2021   HGB 9.1 (L) 11/23/2021   HCT 27.4 (L) 11/23/2021   MCV 91.3 11/23/2021   PLT 259 11/23/2021   Lab Results  Component Value Date   NA 138 11/23/2021   K 4.0 11/23/2021   CL 103 11/23/2021   CO2 25 11/23/2021   CREATININE 1.13 11/23/2021   GLUCOSE 118 (H) 11/23/2021      Diagnostic Studies: DG Chest 1 View  Myocardial Perfusion Imaging  Result Date: 11/05/2021   The study is normal. The study is low risk.   No ST deviation was noted.   LV perfusion is normal. There is no evidence of ischemia. There is no evidence of infarction.   Left ventricular function is abnormal. Global function is mildly reduced. Nuclear stress EF: 50 %. The left ventricular ejection fraction is mildly decreased (45-54%). End diastolic cavity size is normal. End systolic cavity size is normal.   Prior study not available for comparison. Fixed apical anterior perfusion defect with normal wall motion, consistent with artifact Low risk study  CT Super D Chest Wo Contrast  Result Date: 11/06/2021 CLINICAL DATA:  Lung nodule follow-up EXAM: CT CHEST WITHOUT CONTRAST TECHNIQUE: Multidetector CT imaging of the chest was performed using thin slice collimation for electromagnetic bronchoscopy planning purposes, without intravenous contrast. COMPARISON:  PET-CT dated September 27, 2021; CT angio chest dated  September 04, 2021 FINDINGS: Cardiovascular: Mild cardiomegaly. Coronary artery calcifications of the LAD and RCA. Atherosclerotic disease of the thoracic aorta. Mediastinum/Nodes: Right supraclavicular lymph node measuring 7 mm in short axis, unchanged compared to prior exams, was not hypermetabolic on prior PET-CT. No pathologically enlarged lymph nodes seen in the chest. Small hiatal hernia. Thyroid is unremarkable. Lungs/Pleura: Spiculated left upper lobe nodule measures 2.7 x 2.2 cm, unchanged compared to prior exam when remeasured in similar plane. No new or enlarging pulmonary nodules. Upper Abdomen: Partially visualized exophytic simple cyst of the left kidney. No acute abnormality. Musculoskeletal: Subacute left posterolateral rib fractures. Nondisplaced fracture extending through the T5 vertebral body which does not involve the posterior wall. 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 Increased sclerosis of the lateral left rib, likely due to healing fracture. Unchanged sclerotic lesion of the lateral T9 rib, likely a bone island or sequela of prior fracture. IMPRESSION: 1. Spiculated nodule of the left upper lobe is unchanged in size compared to prior exam. 2. No pathologically enlarged lymph nodes seen in the chest. Subcentimeter right supraclavicular lymph node is unchanged in size when compared with prior exams and was not hypermetabolic on PET-CT, likely reactive. 3. Nondisplaced fracture  extending through the T5 vertebral body which does not involve the posterior wall with a background of diffuse idiopathic skeletal hyperostosis (DISH). This finding correlates with previously seen focal FDG uptake of the T5 vertebral body and is likely subacute. 4. Subacute left posterolateral rib fractures with evidence of interval healing. 5.  Aortic Atherosclerosis (ICD10-I70.0). These results will be called to the ordering clinician or representative by the Radiologist Assistant, and  communication documented in the PACS or [employer]. Electronically Signed   By: Yetta Glassman M.D.   On: 11/06/2021 09:14   DG C-ARM BRONCHOSCOPY  Result Date: 11/15/2021 C-ARM BRONCHOSCOPY: Fluoroscopy was utilized by the requesting physician.  No radiographic interpretation.    CLINICAL DATA:  Recent left lung surgery   EXAM: CHEST - 2 VIEW   COMPARISON:  Previous studies including the examination of 11/22/2021   FINDINGS: Transverse diameter of heart is increased. There is decreased aeration in the left lung. There is possible loculated effusion in the left pleural space. Metallic plate and surgical screws seen in the left ribs. Linear densities seen in the left parahilar region and lower lung fields. There is some improvement in aeration of right lower lung fields. Tip of central venous catheter is seen in the region of superior vena cava. Subcutaneous emphysema is seen in the left chest wall. There is no demonstrable pneumothorax.   IMPRESSION: Cardiomegaly. Left pleural effusion. There is interval decrease in subsegmental atelectasis in the right lower lung fields.     Electronically Signed   By: Elmer Picker M.D.   On: 11/23/2021 09:19    Discharge Medications: Allergies as of 11/23/2021   No Known Allergies      Medication List     TAKE these medications    atenolol 100 MG tablet Commonly known as: TENORMIN Take 100 mg by mouth daily.   atorvastatin 20 MG tablet Commonly known as: LIPITOR Take 20 mg by mouth daily.   famotidine 20 MG tablet Commonly known as: PEPCID Take 20 mg by mouth daily.   Gabapentin 50 MG Tabs Take 300 mg by mouth 2 (two) times daily.   guaiFENesin 600 MG 12 hr tablet Commonly known as: MUCINEX Take 2 tablets (1,200 mg total) by mouth 2 (two) times daily. Recommend using for 1 week after discharge.   losartan-hydrochlorothiazide 100-12.5 MG tablet Commonly known as: HYZAAR Take 1 tablet by mouth daily.    metFORMIN 500 MG 24 hr tablet Commonly known as: GLUCOPHAGE-XR Take 1,000 mg by mouth every evening.   traMADol 50 MG tablet Commonly known as: ULTRAM Take 1 tablet (50 mg total) by mouth every 6 (six) hours as needed for up to 7 days (mild pain).               Durable Medical Equipment  (From admission, onward)           Start     Ordered   11/20/21 0758  For home use only DME 4 wheeled rolling walker with seat  Once       Question:  Patient needs a walker to treat with the following condition  Answer:  Physical deconditioning   11/20/21 0757            Follow Up Appointments:  Follow-up Information     Lajuana Matte, MD. Go on 11/30/2021.   Specialty: Cardiothoracic Surgery Why: Your appointment with Dr. Kipp Brood is at 10:20am.  Also obtain a chest x-ray 1/2-hour prior to appointment at [facility] which is  located in the same office complex on the first floor. Contact information: [place] [address] [phone]         Curt Bears, MD. Go on 12/04/2021.   Specialty: Oncology Why: Your appoiintment is at 9:15am Contact information: [place] [address] [phone]                 Signed: Joline Maxcy 11/23/2021, 9:29 AM

## 2021-11-15 NOTE — Progress Notes (Signed)
Pink tape removed and ETT resecured with hollister tube holder at 24cm at the lip. Pt tolerated well. No breakdown to lip or cheeks at this time. RT will continue to monitor and be available as needed.

## 2021-11-15 NOTE — Progress Notes (Signed)
Attending:    Subjective: This is a 71 y/o male with a PET scan hypermetabolic pulmonary mass underwent bronchoscopy, dye marking and biopsy today followed by a thorascopic left upper lobectomy complicated by massive bleeding.  Intra-operatively he received multiple blood products, bleeding was stabilized and his blood pressure improved to the point that he only required low dose levophed. PCCM was consulted for assistance in post operative management of mechanical ventilation and medical management. On arrival to the ICU he had just received rocuronium at 1400 and was on propofol at 137mcg/hr.  Past Medical History:  Diagnosis Date   Diabetes mellitus ([place])    Dyslipidemia    Hypertension      Objective: Vitals:   11/15/21 0555 11/15/21 0633  BP: (!) 190/104 (!) 149/88  Pulse: 72   Resp: 18   Temp: 98.3 F (36.8 C)   TempSrc: Oral   SpO2: 98%   Weight: 89.8 kg   Height: 5\' 11"  (1.803 m)       Intake/Output Summary (Last 24 hours) at 11/15/2021 1346 Last data filed at 11/15/2021 1313 Gross per 24 hour  Intake 9343 ml  Output 2210 ml  Net 7133 ml    General:  In bed on vent HENT: NCAT ETT in place PULM: CTA B, vent supported breathing CV: RRR, no mgr GI: BS+, soft, nontender MSK: normal bulk and tone Neuro: sedated on vent, no motor or eye opening on my exam     CBC    Component Value Date/Time   WBC 8.5 11/13/2021 1000   RBC 4.38 11/13/2021 1000   HGB 13.8 11/13/2021 1000   HGB 13.7 09/12/2021 1350   HCT 41.1 11/13/2021 1000   PLT 200 11/13/2021 1000   PLT 190 09/12/2021 1350   MCV 93.8 11/13/2021 1000   MCH 31.5 11/13/2021 1000   MCHC 33.6 11/13/2021 1000   RDW 13.0 11/13/2021 1000   LYMPHSABS 3.6 09/12/2021 1350   MONOABS 0.9 09/12/2021 1350   EOSABS 0.3 09/12/2021 1350   BASOSABS 0.1 09/12/2021 1350    BMET    Component Value Date/Time   NA 137 11/13/2021 1000   K 4.2 11/13/2021 1000   CL 107 11/13/2021 1000   CO2 19 (L) 11/13/2021 1000    GLUCOSE 145 (H) 11/13/2021 1000   BUN 24 (H) 11/13/2021 1000   CREATININE 1.58 (H) 11/13/2021 1000   CREATININE 1.74 (H) 09/12/2021 1350   CALCIUM 9.2 11/13/2021 1000   GFRNONAA 47 (L) 11/13/2021 1000   GFRNONAA 42 (L) 09/12/2021 1350    CXR images pre-op personally reviewed: emphysema bilaterally, left upper lobe nodule PFT pre-op: Ratio 72%, FEV1 3.65 L (97%), TLC 8.6 L (118% pred), DLCO 70  Impression/Plan: Left upper lobe mass, lung cancer> f/u path S/p Left upper lobectomy complicated by pulmonary artery bleeding, now stabilized> monitor CBC, chest tube output, ventilator stabilization protocol, plan pressure support ventilation and use light sedation At risk for stroke post massive blood loss: I think that his exam right now is reflective of the rocuronium he received this afternoon.  Will use light sedation with PAD protocol, precedex, prn fentanyl  Will start weaning sedation at 1600.  If not waking up then we'll reverse the paralytic and if he doesn't wake up at that point we can check CT head  Massive blood loss> now without evidence of ongoing bleeding; repeat CBC, PT/INR Hyperkalemia > repeat BMET now  My cc time 35 minutes  Roselie Awkward, MD [facility] Pager: [phone] Cell: [phone] After  7pm: [phone]

## 2021-11-15 NOTE — Anesthesia Procedure Notes (Signed)
Central Venous Catheter Insertion Performed by: Catalina Gravel, MD, anesthesiologist Start/End12/12/2020 1:30 PM, 11/15/2021 1:40 PM Patient location: OR. Preanesthetic checklist: patient identified, IV checked, site marked, risks and benefits discussed, surgical consent, monitors and equipment checked, pre-op evaluation, timeout performed and anesthesia consent Position: Trendelenburg Lidocaine 1% used for infiltration and patient sedated Hand hygiene performed , maximum sterile barriers used  and Seldinger technique used Catheter size: 8 Fr Total catheter length 16. Central line was placed.Double lumen Procedure performed using ultrasound guided technique. Ultrasound Notes:anatomy identified, needle tip was noted to be adjacent to the nerve/plexus identified, no ultrasound evidence of intravascular and/or intraneural injection and image(s) printed for medical record Attempts: 1 Following insertion, line sutured, dressing applied and Biopatch. Post procedure assessment: blood return through all ports, free fluid flow and no air  Patient tolerated the procedure well with no immediate complications.

## 2021-11-15 NOTE — Op Note (Signed)
[address]       [place] [address]             [phone]        11/15/2021  Patient:  Karl Pock Pre-Op Dx: Left upper lobe pulmonary nodule Post-op Dx: Same Procedure: - Robotic assisted left video thoracoscopy -Emergency thoracotomy -Left upper lobectomy - Mediastinal lymph node sampling - Intercostal nerve block - Rib plating x2  Surgeon and Role:      * Venia Riveron, Lucile Crater, MD - Primary    * Dr. Roxan Hockey, MD -assisting with control of pulmonary artery bleeding  Assistant: Evonnie Pat, PA-C  An experienced assistant was required given the complexity of this surgery and the standard of surgical care. The assistant was needed for exposure, dissection, suctioning, retraction of delicate tissues and sutures, instrument exchange and for overall help during this procedure.    Anesthesia  general EBL: 2-1/2 L Blood Administration: Massive transfusion protocol was initiated Specimen: Left upper lobe.  Level 9 lymph node, level 5 and 6 lymph nodes, level 7 lymph node  Drains: 28 F argyle chest tube in left chest Counts: correct   Indications: 70 year old male with a 3.1 cm left upper lobe pulmonary mass concerning for primary lung cancer.  He will require a stress test given his coronary calcifications and smoking history.  He is potentially somebody that he can undergo a combination procedure with Dr.Icard for tissue diagnosis so that we can proceed directly with a lobectomy.  We will look for dates for a left robotic assisted left upper lobectomy combination with the navigational bronchoscopy.  Findings: The bronchoscopy was positive for non-small cell lung cancer.  He was taken directly for a left upper lobectomy.  His fissure was partially developed.  During skeletonization of the lingular branches, there was bleeding noted from the small branch.  We held pressure for several minutes but continued to have bleeding.  Pressure was held and a  thoracotomy was performed.  There was significant bleeding that was encountered while while attempting to repair it.  We were able to achieve adequate control.  Rib plating was required due to to rib fractures from the [facility] retractor.  Operative Technique: After the risks, benefits and alternatives were thoroughly discussed, the patient was brought to the operative theatre.  Anesthesia was induced, and the patient was then placed in a right lateral decubitus position and was prepped and draped in normal sterile fashion.  An appropriate surgical pause was performed, and pre-operative antibiotics were dosed accordingly.  We began by placing our 4 robotic ports in the the 7th intercostal space targeting the hilum of the lung.  A 67mm assistant port was placed in the 9th intercostal space in the anterior axillary line.  The robot was then docked and all instruments were passed under direct visualization.    The lung was then retracted superiorly, and the inferior pulmonary ligament was divided.  The hilum was mobilized anteriorly and posteriorly.  I focused my attention first to the fissure.  It was partially developed.  I then went back posteriorly and approach the pulmonary artery.  Identified several branches of the anterior truncus of the fissure.  These were divided.  I then moved anteriorly and divided the pulmonary vein to the upper lobe.  This helped to expose the main truncal branches which were then divided.  I moved back to the fissure and continued to that skeletonized the pulmonary artery to identify the lingular  branches.  There was 1 main lingular branch that was divided with a Endo GIA stapler.  On further mobilization of the fissure there was another smaller lingular branch that was identified.  During its skeletonization hole was encountered in the midportion of this branch.  Pressure was held for several minutes with her continued to be bleeding.  I attempted to complete the fissure and  include the pulmonary branch within the staple line but this was unsuccessful.  I continue to hold pressure while we prepared to perform a thoracotomy.  I scrubbed back into the case and removed all of the robotic ports except for the anterior instrument which was holding pressure.  While this was in place, I performed an anterior thoracotomy at the third intercostal space.  I inserted a sponge stick and then removed the robotic instruments and undocked the robot.  There was significant bleeding that was encountered at this point.  I extended my incision and was able to then gain control of the pulmonary artery bleeding.  With adequate visualization we were able to place interrupted 4-0 pledgeted sutures to control the pulmonary artery.  The bronchus was then divided and the specimen was removed.  Meticulous hemostasis was obtained and an intercostal nerve block was then performed.  There were fractures on the ribs involving a thoracotomy.  Using the plating system, we fixated the ribs.  0 Vicryl suture was then used to reapproximate the ribs.  The skin and soft tissue were closed between several layers absorbable suture.  A 28 F chest with then placed, and we watch the remaining lobes re-expand.  The skin and soft tissue were closed with absorbable suture    The patient remained intubated and was transferred to the ICU in guarded condition.  Eilis Chestnutt Bary Leriche

## 2021-11-15 NOTE — Interval H&P Note (Signed)
History and Physical Interval Note:  11/15/2021 7:04 AM  [name]  has presented today for surgery, with the diagnosis of PULMONARY MASS.  The various methods of treatment have been discussed with the patient and family. After consideration of risks, benefits and other options for treatment, the patient has consented to  Procedure(s): XI ROBOTIC ASSISTED THORASCOPY-LEFT UPPER LOBECTOMY (Left) as a surgical intervention.  The patient's history has been reviewed, patient examined, no change in status, stable for surgery.  I have reviewed the patient's chart and labs.  Questions were answered to the patient's satisfaction.     Nansi Birmingham Bary Leriche

## 2021-11-15 NOTE — Anesthesia Procedure Notes (Addendum)
Procedure Name: Intubation Date/Time: 11/15/2021 8:07 AM Performed by: Lorie Phenix, CRNA Pre-anesthesia Checklist: Patient identified, Emergency Drugs available, Suction available and Patient being monitored Patient Re-evaluated:Patient Re-evaluated prior to induction Oxygen Delivery Method: Circle system utilized Preoxygenation: Pre-oxygenation with 100% oxygen Induction Type: IV induction Laryngoscope Size: Mac and 4 Grade View: Grade I Tube type: Oral Endobronchial tube: Left, EBT position confirmed by fiberoptic bronchoscope and Double lumen EBT and 39 Fr Number of attempts: 1 Airway Equipment and Method: Stylet, Oral airway and Fiberoptic brochoscope Placement Confirmation: ETT inserted through vocal cords under direct vision, positive ETCO2 and breath sounds checked- equal and bilateral Tube secured with: Tape Dental Injury: Teeth and Oropharynx as per pre-operative assessment

## 2021-11-15 NOTE — Progress Notes (Signed)
[facility] P[facility]M Evening Rounds  On exam: eye opening to voice, still doesn't follow commands, now triggering ventilator  Minimal chest tube output  Continue to use minimal sedation Ready for extubation when more awake  Roselie Awkward, MD [place] Pager: [phone] Cell: [phone] After 7:00 pm call Elink  [phone]

## 2021-11-15 NOTE — Consult Note (Signed)
NAME:  [name], MRN:  [id number], DOB:  1951-04-03, LOS: 0 ADMISSION DATE:  11/15/2021, CONSULTATION DATE:  11/16/2021 REFERRING MD:  Dr. Kipp Brood, CHIEF COMPLAINT:  Massive transfusion protocol    History of Present Illness:  [name] is a 70 y.o. male with a PMH significant for type 2 diabetes, dyslipidemia, and HTN who presented to [facility] for elective bronchoscopy and left upper lobectomy after an outpatient chest CT followed by PET revealed a spiculated left upper lobe nodule.   Per verbal repot intraoperatively patient suffered massive bleeding from pulmonary artery that resulted in massive transfusion (upward of 8 units of PRBC and possibly ? 5 units of FFP)   Pertinent  Medical History  Type 2 diabetes Dyslipidemia HTN   Significant Hospital Events: Including procedures, antibiotic start and stop dates in addition to other pertinent events   12/1 Elective left upper lobectomy with massive bleeding intraoperatively, PCCM consulted for medical management    Interim History / Subjective:  As above   Objective   Blood pressure (!) 149/88, pulse 72, temperature 98.3 F (36.8 C), temperature source Oral, resp. rate 18, height 5\' 11"  (1.803 m), weight 89.8 kg, SpO2 98 %.        Intake/Output Summary (Last 24 hours) at 11/15/2021 1308 Last data filed at 11/15/2021 1307 Gross per 24 hour  Intake 7981 ml  Output 2110 ml  Net 5871 ml   Filed Weights   11/15/21 0555  Weight: 89.8 kg    Examination: General: Acute on chronically ill appearing middle aged male lying in bed on mechanical ventilation, in NAD HEENT: ETT, MM pink/moist, PERRL,  Neuro: Sedated on vent CV: s1s2 regular rate and rhythm, no murmur, rubs, or gallops,  PULM:  Clear to ascultation, tolerating vent no increased work of breathing  GI: soft, bowel sounds active in all 4 quadrants, non-tender, non-distended Extremities: warm/dry, no edema  Skin: no rashes or lesions  Resolved Hospital  Problem list     Assessment & Plan:  Spiculated LUL pulmonary nodule  - highly suspicious for malignancy (PET avid) now s/p navigational bronch with bx and LULobectomy. OR case c/b bleeding pulmonary artery - total 8units PRBC and ?5FFP P: CVTS primary  F/u path  Keep on vent overnight - no weaning protocol  Watch for bleeding  F/u CXR    Hemorrhagic  shock -In the setting of intraoperative bleeding  Blood loss anemia  P: Aggressive resuscitation to hemodynamic stability Continue pressor support for MAP goal of > 65 Trend CBC q6 Now s/p 8 units PRBC  NPO  Maintain 2 large bore PIVs / and or maintain CVL    DM  P:  Holding metformin  SSI    HTN  P: Holding home atenolol, hyzaar  Best Practice (right click and "Reselect all SmartList Selections" daily)   Diet/type: NPO DVT prophylaxis: SCD GI prophylaxis: PPI Lines: N/A Foley:  Yes, and it is still needed Code Status:  full code Last date of multidisciplinary goals of care discussion: Per primary   Labs   CBC: Recent Labs  Lab 11/13/21 1000  WBC 8.5  HGB 13.8  HCT 41.1  MCV 93.8  PLT 194    Basic Metabolic Panel: Recent Labs  Lab 11/13/21 1000  NA 137  K 4.2  CL 107  CO2 19*  GLUCOSE 145*  BUN 24*  CREATININE 1.58*  CALCIUM 9.2   GFR: Estimated Creatinine Clearance: 47 mL/min (A) (by C-G formula based on SCr of 1.58  mg/dL (H)). Recent Labs  Lab 11/13/21 1000  WBC 8.5    Liver Function Tests: Recent Labs  Lab 11/13/21 1000  AST 25  ALT 19  ALKPHOS 73  BILITOT 0.9  PROT 7.4  ALBUMIN 4.1   No results for input(s): LIPASE, AMYLASE in the last 168 hours. No results for input(s): AMMONIA in the last 168 hours.  ABG    Component Value Date/Time   PHART 7.396 11/13/2021 1000   PCO2ART 33.2 11/13/2021 1000   PO2ART 102 11/13/2021 1000   HCO3 19.9 (L) 11/13/2021 1000   ACIDBASEDEF 4.1 (H) 11/13/2021 1000   O2SAT 97.9 11/13/2021 1000     Coagulation Profile: Recent Labs  Lab  11/13/21 1000  INR 0.9    Cardiac Enzymes: No results for input(s): CKTOTAL, CKMB, CKMBINDEX, TROPONINI in the last 168 hours.  HbA1C: No results found for: HGBA1C  CBG: Recent Labs  Lab 11/13/21 0936 11/15/21 0557  GLUCAP 177* 153*    Review of Systems:   Unable to assess   Past Medical History:  He,  has a past medical history of Diabetes mellitus ([place]), Dyslipidemia, and Hypertension.   Surgical History:   Past Surgical History:  Procedure Laterality Date   INGUINAL HERNIA REPAIR       Social History:   reports that he quit smoking about 2 months ago. His smoking use included cigarettes. He has a 47.00 pack-year smoking history. He has never used smokeless tobacco. He reports current alcohol use of about 2.0 standard drinks per week. He reports that he does not use drugs.   Family History:  His family history includes Mental illness in his mother.   Allergies No Known Allergies   Home Medications  Prior to Admission medications   Medication Sig Start Date End Date Taking? Authorizing Provider  atenolol (TENORMIN) 100 MG tablet Take 100 mg by mouth daily. 08/03/21  Yes [provider]  atorvastatin (LIPITOR) 20 MG tablet Take 20 mg by mouth daily. 08/06/21  Yes [provider]  famotidine (PEPCID) 20 MG tablet Take 20 mg by mouth daily.   Yes [provider]  losartan-hydrochlorothiazide (HYZAAR) 100-12.5 MG tablet Take 1 tablet by mouth daily. 08/03/21  Yes [provider]  metFORMIN (GLUCOPHAGE-XR) 500 MG 24 hr tablet Take 1,000 mg by mouth every evening. 08/03/21  Yes [provider]  oxyCODONE-acetaminophen (PERCOCET/ROXICET) 5-325 MG tablet Take 1 tablet by mouth every 6 (six) hours as needed for severe pain. Patient not taking: Reported on 11/05/2021 10/01/21   Heilingoetter, Cassandra L, PA-C     Critical care time:    Performed by: Gerrianne Aydelott D. Harris  Total critical care time: 45 minutes  Critical care time  was exclusive of separately billable procedures and treating other patients.  Critical care was necessary to treat or prevent imminent or life-threatening deterioration.  Critical care was time spent personally by me on the following activities: development of treatment plan with patient and/or surrogate as well as nursing, discussions with consultants, evaluation of patient's response to treatment, examination of patient, obtaining history from patient or surrogate, ordering and performing treatments and interventions, ordering and review of laboratory studies, ordering and review of radiographic studies, pulse oximetry and re-evaluation of patient's condition.  Olivia Pavelko D. Kenton Kingfisher, NP-C [facility] Personal contact information can be found on Amion  11/15/2021, 1:27 PM

## 2021-11-15 NOTE — Anesthesia Procedure Notes (Signed)
Arterial Line Insertion Start/End12/12/2020 11:52 AM, 11/15/2021 12:02 PM Performed by: Catalina Gravel, MD, anesthesiologist  Patient location: OB. Preanesthetic checklist: patient identified, IV checked, site marked, risks and benefits discussed, surgical consent, monitors and equipment checked, pre-op evaluation, timeout performed and anesthesia consent Lidocaine 1% used for infiltration Right, radial was placed Catheter size: 20 G Hand hygiene performed  and maximum sterile barriers used   Attempts: 3 Procedure performed without using ultrasound guided technique. Following insertion, dressing applied and Biopatch. Post procedure assessment: normal and unchanged  Patient tolerated the procedure well with no immediate complications.

## 2021-11-15 NOTE — Hospital Course (Addendum)
  History of Present Illness:   At time of surgical consultation [name] 70 y.o. male referred by Dr. Julien Nordmann for surgical evaluation of a 3.1 cm PET avid left upper lobe pulmonary mass.  This was found incidentally when he presented to the emergency department complaining of some chest and back pain.  Imaging was consistent with some rib fractures as well as this 3.1 cm pulmonary mass.  He has undergone an MRI brain which is shown no evidence of metastatic disease, as well as a PET/CT which shows significant avidity in the pulmonary mass.  He has no hilar or mediastinal lymph uptake.  Dr. Kipp Brood reviewed the patient and all pertinent studies and recommended proceeding with resection.  He was admitted this hospitalization for the procedure.  Hospital course:  The patient was admitted and taken to the operating room on 11/15/2021  He underwent the following procedure:  Procedure: - Robotic assisted left video thoracoscopy -Emergency thoracotomy -Left upper lobectomy - Mediastinal lymph node sampling - Intercostal nerve block - Rib plating x2 Emergency thoracotomy was performed secondary to pulmonary artery injury in the lingular branch.  Postoperative hospital course: The patient was transferred to the SICU and PCCM consultation was obtained to assist with management..  Patient was extubated the evening of surgery.  He has remained on Levophed for blood pressure assistance.  Hemorrhagic shock was fully corrected.  He has remained neurologically intact.  Creatinine has remained stable and he has adequate urine output.  He does have some chronic renal insufficiency.  He was placed on a PCA on postop day 1 for assistance with pain management.  He has had additional postoperative packed cell transfusions.  He was placed on midodrine to assist with some postoperative orthostasis.  Creatinine has continued to downtrend.  He is transferred to the floor on postoperative day #3.  Chest tube  was kept in place till postop day #***  Pain has shown good and gradual improvement over time.

## 2021-11-15 NOTE — Procedures (Addendum)
Extubation Procedure Note  Patient Details:   Name: [name] DOB: [birth year] MRN: [id number]   Airway Documentation:    Vent end date: 11/15/21 Vent end time: 2258   Evaluation  O2 sats: stable throughout Complications: No apparent complications Patient did tolerate procedure well. Bilateral Breath Sounds: Clear, Diminished   Yes  Graciella Freer 11/15/2021, 11:01 PM  Pt extubated per order. Cuff leak noted. Pt currently on 4L [place] satting 100% at this time.

## 2021-11-15 NOTE — Brief Op Note (Signed)
11/15/2021  1:01 PM  PATIENT:  [name]  70 y.o. male  PRE-OPERATIVE DIAGNOSIS:  PULMONARY MASS  POST-OPERATIVE DIAGNOSIS:  PULMONARY MASS  PROCEDURE:  Procedure(s): XI ROBOTIC ASSISTED THORASCOPY-LEFT UPPER LOBECTOMY (Left) NODE DISSECTION (Left) INTERCOSTAL NERVE BLOCK (Left) THORACOTOMY Left Upper Lobectomy (Left) RIB PLATING (Left) APPLICATION OF CELL SAVER (Left)  SURGEON:  Surgeon(s) and Role:    * Lightfoot, Lucile Crater, MD - Primary    * [place]rose Nakayama, MD - Assisting  PHYSICIAN ASSISTANT: Lalanya Rufener PA-C  ANESTHESIA:   general  EBL:  1950 mL   BLOOD ADMINISTERED:8 UNITS PRBC'S, 2 FFP  DRAINS:  28 F CHEST TUBE    LOCAL MEDICATIONS USED:  OTHER EXPAREL  SPECIMEN:  Source of Specimen:  LUL, MULTIPLE LN SAMPLES  DISPOSITION OF SPECIMEN:  PATHOLOGY  COUNTS:  YES  TOURNIQUET:  * No tourniquets in log *  DICTATION: .Dragon Dictation  PLAN OF CARE: Admit to inpatient   PATIENT DISPOSITION:  ICU - intubated and hemodynamically stable.   Delay start of Pharmacological VTE agent (>24hrs) due to surgical blood loss or risk of bleeding: yes

## 2021-11-15 NOTE — Op Note (Signed)
Video Bronchoscopy with Robotic Assisted Bronchoscopic Navigation   Date of Operation: 11/15/2021   Pre-op Diagnosis: lung nodule   Post-op Diagnosis: lung nodule   Surgeon: Garner Nash, DO   Assistants: None   Anesthesia: General endotracheal anesthesia  Operation: Flexible video fiberoptic bronchoscopy with robotic assistance and biopsies.  Estimated Blood Loss: Minimal  Complications: None  Indications and History: [name] is a 70 y.o. male with history of lung nodule. The risks, benefits, complications, treatment options and expected outcomes were discussed with the patient.  The possibilities of pneumothorax, pneumonia, reaction to medication, pulmonary aspiration, perforation of a viscus, bleeding, failure to diagnose a condition and creating a complication requiring transfusion or operation were discussed with the patient who freely signed the consent.    Description of Procedure: The patient was seen in the Preoperative Area, was examined and was deemed appropriate to proceed.  The patient was taken to [facility] endoscopy room 3, identified as [name] and the procedure verified as Flexible Video Fiberoptic Bronchoscopy.  A Time Out was held and the above information confirmed.   Prior to the date of the procedure a high-resolution CT scan of the chest was performed. Utilizing ION software program a virtual tracheobronchial tree was generated to allow the creation of distinct navigation pathways to the patient's parenchymal abnormalities. After being taken to the operating room general anesthesia was initiated and the patient  was orally intubated. The video fiberoptic bronchoscope was introduced via the endotracheal tube and a general inspection was performed which showed normal right and left lung anatomy, aspiration of the bilateral mainstems was completed to remove any remaining secretions. Robotic catheter inserted into patient's endotracheal tube.    Target #1 LUL: The distinct navigation pathways prepared prior to this procedure were then utilized to navigate to patient's lesion identified on CT scan. The robotic catheter was secured into place and the vision probe was withdrawn.  Lesion location was approximated using fluoroscopy and radial endobronchial ultrasound for peripheral targeting. Under fluoroscopic guidance transbronchial needle brushings, transbronchial needle biopsies, and transbronchial forceps biopsies were performed to be sent for cytology and pathology. Following tissue sampling fiducial dye marking was completed using a 21-gauge transbronchial needle under fluoroscopic guidance.  We injected 1.5 cc of 50% / 50% methylene blue and ICG.  At the end of the procedure a general airway inspection was performed and there was no evidence of active bleeding. The bronchoscope was removed.  The patient tolerated the procedure well. There was no significant blood loss and there were no obvious complications. A post-procedural chest x-ray is pending.  Samples Target #1: 1. Transbronchial needle brushings from LUL nodule 2. Transbronchial Wang needle biopsies from LUL nodule  3. Transbronchial forceps biopsies from LUL nodule   Plans:  Patient will be transferred to [facility] operating room 10 under the care of Dr. Kipp Brood and anesthesia for planned robotic assisted lobectomy.  Garner Nash, DO [facility] 11/15/2021 8:24 AM

## 2021-11-15 NOTE — Transfer of Care (Signed)
Immediate Anesthesia Transfer of Care Note  Patient: [name]  Procedure(s) Performed: ROBOTIC ASSISTED NAVIGATIONAL BRONCHOSCOPY FIDUCIAL DYE MARKING VIDEO BRONCHOSCOPY WITH RADIAL ENDOBRONCHIAL ULTRASOUND BRONCHIAL BIOPSIES BRONCHIAL BRUSHINGS XI ROBOTIC ASSISTED THORASCOPY (Left: Chest) NODE DISSECTION (Left: Chest) INTERCOSTAL NERVE BLOCK (Left: Chest) EMERGENCY THORACOTOMY (Left: Chest) RIB PLATING (Left: Chest) APPLICATION OF CELL SAVER (Left: Chest) LEFT UPPER LOBECTOMY (Left: Chest) MEDIASTINAL LYMPH NODE EXPLORATION (Left: Chest)  Patient Location: SICU  Anesthesia Type:General  Level of Consciousness: Patient remains intubated per anesthesia plan  Airway & Oxygen Therapy: Patient remains intubated per anesthesia plan  Post-op Assessment: Report given to RN and Post -op Vital signs reviewed and stable  Post vital signs: Reviewed and stable  Last Vitals:  Vitals Value Taken Time  BP    Temp    Pulse 75 11/15/21 1416  Resp 12 11/15/21 1435  SpO2 99 % 11/15/21 1435  Vitals shown include unvalidated device data.  Last Pain:  Vitals:   11/15/21 0633  TempSrc:   PainSc: 0-No pain         Complications: No notable events documented.

## 2021-11-15 NOTE — Anesthesia Postprocedure Evaluation (Signed)
Anesthesia Post Note  Patient: [name]  Procedure(s) Performed: ROBOTIC ASSISTED NAVIGATIONAL BRONCHOSCOPY FIDUCIAL DYE MARKING VIDEO BRONCHOSCOPY WITH RADIAL ENDOBRONCHIAL ULTRASOUND BRONCHIAL BIOPSIES BRONCHIAL BRUSHINGS XI ROBOTIC ASSISTED THORASCOPY (Left: Chest) NODE DISSECTION (Left: Chest) INTERCOSTAL NERVE BLOCK (Left: Chest) EMERGENCY THORACOTOMY (Left: Chest) RIB PLATING (Left: Chest) APPLICATION OF CELL SAVER (Left: Chest) LEFT UPPER LOBECTOMY (Left: Chest) MEDIASTINAL LYMPH NODE EXPLORATION (Left: Chest)     Patient location during evaluation: SICU Anesthesia Type: General Level of consciousness: sedated Pain management: pain level controlled Vital Signs Assessment: post-procedure vital signs reviewed and stable Respiratory status: patient remains intubated per anesthesia plan Cardiovascular status: unstable (Requiring levophed gtt) Postop Assessment: no apparent nausea or vomiting Anesthetic complications: no Comments: Unplanned post-op intubation due to massive blood loss and resuscitation in the OR.   No notable events documented.  Last Vitals:  Vitals:   11/15/21 1645 11/15/21 1700  BP:    Pulse:    Resp: 18 19  Temp:    SpO2: 100% 100%    Last Pain:  Vitals:   11/15/21 1625  TempSrc: Oral  PainSc:                  Catalina Gravel

## 2021-11-15 NOTE — Anesthesia Procedure Notes (Addendum)
Procedure Name: Intubation Date/Time: 11/15/2021 7:34 AM Performed by: Lorie Phenix, CRNA Pre-anesthesia Checklist: Patient identified, Emergency Drugs available, Suction available and Patient being monitored Patient Re-evaluated:Patient Re-evaluated prior to induction Oxygen Delivery Method: Circle System Utilized Preoxygenation: Pre-oxygenation with 100% oxygen Induction Type: IV induction Ventilation: Mask ventilation without difficulty and Oral airway inserted - appropriate to patient size Laryngoscope Size: Mac and 4 Grade View: Grade I Tube type: Oral Nasal Tubes: Nasal Rae and Nasal prep performed Tube size: 8.5 mm Number of attempts: 1 Airway Equipment and Method: Stylet and Oral airway Placement Confirmation: ETT inserted through vocal cords under direct vision, positive ETCO2 and breath sounds checked- equal and bilateral Secured at: 24 cm Tube secured with: Tape Dental Injury: Teeth and Oropharynx as per pre-operative assessment

## 2021-11-16 ENCOUNTER — Inpatient Hospital Stay (HOSPITAL_COMMUNITY): Payer: Medicare Other

## 2021-11-16 DIAGNOSIS — R911 Solitary pulmonary nodule: Secondary | ICD-10-CM | POA: Diagnosis not present

## 2021-11-16 DIAGNOSIS — G9341 Metabolic encephalopathy: Secondary | ICD-10-CM | POA: Diagnosis not present

## 2021-11-16 LAB — BPAM PLATELET PHERESIS
Blood Product Expiration Date: 202212042359
ISSUE DATE / TIME: 202212011838
Unit Type and Rh: 6200

## 2021-11-16 LAB — CBC
HCT: 24.2 % — ABNORMAL LOW (ref 39.0–52.0)
Hemoglobin: 8.3 g/dL — ABNORMAL LOW (ref 13.0–17.0)
MCH: 29.6 pg (ref 26.0–34.0)
MCHC: 34.3 g/dL (ref 30.0–36.0)
MCV: 86.4 fL (ref 80.0–100.0)
Platelets: 94 10*3/uL — ABNORMAL LOW (ref 150–400)
RBC: 2.8 MIL/uL — ABNORMAL LOW (ref 4.22–5.81)
RDW: 16 % — ABNORMAL HIGH (ref 11.5–15.5)
WBC: 10.8 10*3/uL — ABNORMAL HIGH (ref 4.0–10.5)
nRBC: 0 % (ref 0.0–0.2)

## 2021-11-16 LAB — BASIC METABOLIC PANEL
Anion gap: 7 (ref 5–15)
BUN: 26 mg/dL — ABNORMAL HIGH (ref 8–23)
CO2: 24 mmol/L (ref 22–32)
Calcium: 8.1 mg/dL — ABNORMAL LOW (ref 8.9–10.3)
Chloride: 108 mmol/L (ref 98–111)
Creatinine, Ser: 1.53 mg/dL — ABNORMAL HIGH (ref 0.61–1.24)
GFR, Estimated: 49 mL/min — ABNORMAL LOW (ref >60)
Glucose, Bld: 140 mg/dL — ABNORMAL HIGH (ref 70–99)
Potassium: 3.7 mmol/L (ref 3.5–5.1)
Sodium: 139 mmol/L (ref 135–145)

## 2021-11-16 LAB — CBC WITH DIFFERENTIAL/PLATELET
Abs Immature Granulocytes: 0.05 10*3/uL (ref 0.00–0.07)
Basophils Absolute: 0 10*3/uL (ref 0.0–0.1)
Basophils Relative: 0 %
Eosinophils Absolute: 0 10*3/uL (ref 0.0–0.5)
Eosinophils Relative: 0 %
HCT: 24.6 % — ABNORMAL LOW (ref 39.0–52.0)
Hemoglobin: 8.6 g/dL — ABNORMAL LOW (ref 13.0–17.0)
Immature Granulocytes: 0 %
Lymphocytes Relative: 13 %
Lymphs Abs: 1.5 10*3/uL (ref 0.7–4.0)
MCH: 30.2 pg (ref 26.0–34.0)
MCHC: 35 g/dL (ref 30.0–36.0)
MCV: 86.3 fL (ref 80.0–100.0)
Monocytes Absolute: 1.4 10*3/uL — ABNORMAL HIGH (ref 0.1–1.0)
Monocytes Relative: 12 %
Neutro Abs: 8.7 10*3/uL — ABNORMAL HIGH (ref 1.7–7.7)
Neutrophils Relative %: 75 %
Platelets: 94 10*3/uL — ABNORMAL LOW (ref 150–400)
RBC: 2.85 MIL/uL — ABNORMAL LOW (ref 4.22–5.81)
RDW: 16.1 % — ABNORMAL HIGH (ref 11.5–15.5)
WBC: 11.6 10*3/uL — ABNORMAL HIGH (ref 4.0–10.5)
nRBC: 0 % (ref 0.0–0.2)

## 2021-11-16 LAB — BPAM CRYOPRECIPITATE
Blood Product Expiration Date: 202212011740
ISSUE DATE / TIME: 202212011158
Unit Type and Rh: 7300

## 2021-11-16 LAB — PREPARE CRYOPRECIPITATE: Unit division: 0

## 2021-11-16 LAB — PREPARE PLATELET PHERESIS: Unit division: 0

## 2021-11-16 LAB — GLUCOSE, CAPILLARY
Glucose-Capillary: 128 mg/dL — ABNORMAL HIGH (ref 70–99)
Glucose-Capillary: 139 mg/dL — ABNORMAL HIGH (ref 70–99)
Glucose-Capillary: 139 mg/dL — ABNORMAL HIGH (ref 70–99)
Glucose-Capillary: 154 mg/dL — ABNORMAL HIGH (ref 70–99)

## 2021-11-16 LAB — BLOOD PRODUCT ORDER (VERBAL) VERIFICATION

## 2021-11-16 MED ORDER — DIPHENHYDRAMINE HCL 12.5 MG/5ML PO ELIX: 12.5000 mg | ORAL_SOLUTION | Freq: Four times a day (QID) | ORAL | Status: DC | PRN

## 2021-11-16 MED ORDER — HYDROMORPHONE 1 MG/ML IV SOLN
INTRAVENOUS | Status: DC
  Administered 2021-11-16 (×3): 30 mg via INTRAVENOUS
  Administered 2021-11-17: 3.9 mg via INTRAVENOUS
  Administered 2021-11-17: 1.2 mg via INTRAVENOUS
  Administered 2021-11-17: 3.6 mg via INTRAVENOUS
  Filled 2021-11-16: qty 30

## 2021-11-16 MED ORDER — DIPHENHYDRAMINE HCL 50 MG/ML IJ SOLN: 12.5000 mg | Freq: Four times a day (QID) | INTRAMUSCULAR | Status: DC | PRN

## 2021-11-16 MED ORDER — GABAPENTIN 600 MG PO TABS
300.0000 mg | ORAL_TABLET | Freq: Two times a day (BID) | ORAL | Status: DC
  Administered 2021-11-16 – 2021-11-23 (×15): 300 mg via ORAL
  Filled 2021-11-16 (×15): qty 1

## 2021-11-16 MED ORDER — POTASSIUM CHLORIDE 10 MEQ/50ML IV SOLN
10.0000 meq | INTRAVENOUS | Status: AC
  Administered 2021-11-16 (×3): 10 meq via INTRAVENOUS
  Filled 2021-11-16 (×3): qty 50

## 2021-11-16 MED ORDER — SODIUM CHLORIDE 0.9% FLUSH: 9.0000 mL | INTRAVENOUS | Status: DC | PRN

## 2021-11-16 MED ORDER — NALOXONE HCL 0.4 MG/ML IJ SOLN: 0.4000 mg | INTRAMUSCULAR | Status: DC | PRN

## 2021-11-16 MED ORDER — ONDANSETRON HCL 4 MG/2ML IJ SOLN: 4.0000 mg | Freq: Four times a day (QID) | INTRAMUSCULAR | Status: DC | PRN

## 2021-11-16 NOTE — Progress Notes (Signed)
      [address]       [place] [address]             [phone]                 1 Day Post-Op Procedure(s) (LRB): ROBOTIC ASSISTED NAVIGATIONAL BRONCHOSCOPY (N/A) FIDUCIAL DYE MARKING VIDEO BRONCHOSCOPY WITH RADIAL ENDOBRONCHIAL ULTRASOUND BRONCHIAL BIOPSIES BRONCHIAL BRUSHINGS   Events: Extubated overnight orthostasis _______________________________________________________________ Vitals: BP 134/75   Pulse 80   Temp 98 F (36.7 C) (Oral)   Resp 15   Ht 5\' 11"  (1.803 m)   Wt 92.5 kg   SpO2 99%   BMI 28.44 kg/m  Filed Weights   11/15/21 0555 11/15/21 1416 11/16/21 0500  Weight: 89.8 kg 89.8 kg 92.5 kg     - Neuro: alert NAD.  Non focal  - Cardiovascular: sinus  Drips: levo 2.      - Pulm: EWOB. Small 1 column air leak   ABG    Component Value Date/Time   PHART 7.389 11/15/2021 1327   PCO2ART 34.7 11/15/2021 1327   PO2ART 322 (H) 11/15/2021 1327   HCO3 21.4 11/15/2021 1327   TCO2 23 11/15/2021 1327   ACIDBASEDEF 4.0 (H) 11/15/2021 1327   O2SAT 100.0 11/15/2021 1327    - Abd: ND - Extremity: warm, edematous  .Intake/Output      12/01 0701 12/02 0700 12/02 0701 12/03 0700   P.O. 400    I.V. (mL/kg) 4895.8 (52.9)    Blood 4393    IV Piggyback 1450    Total Intake(mL/kg) 11138.8 (120.4)    Urine (mL/kg/hr) 1585 (0.7)    Blood 1950    Chest Tube 950    Total Output 4485    Net +6653.8            _______________________________________________________________ Labs: CBC Latest Ref Rng & Units 11/16/2021 11/16/2021 11/15/2021  WBC 4.0 - 10.5 K/uL 10.8(H) 11.6(H) 13.2(H)  Hemoglobin 13.0 - 17.0 g/dL 8.3(L) 8.6(L) 10.5(L)  Hematocrit 39.0 - 52.0 % 24.2(L) 24.6(L) 32.3(L)  Platelets 150 - 400 K/uL 94(L) 94(L) 69(L)   CMP Latest Ref Rng & Units 11/16/2021 11/15/2021 11/15/2021  Glucose 70 - 99 mg/dL 140(H) 247(H) -  BUN 8 - 23 mg/dL 26(H) 23 -  Creatinine 0.61 - 1.24 mg/dL 1.53(H) 1.66(H) -  Sodium 135 - 145 mmol/L 139 139  138  Potassium 3.5 - 5.1 mmol/L 3.7 4.6 5.9(H)  Chloride 98 - 111 mmol/L 108 106 -  CO2 22 - 32 mmol/L 24 20(L) -  Calcium 8.9 - 10.3 mg/dL 8.1(L) 8.1(L) -  Total Protein 6.5 - 8.1 g/dL - 4.7(L) -  Total Bilirubin 0.3 - 1.2 mg/dL - 1.8(H) -  Alkaline Phos 38 - 126 U/L - 36(L) -  AST 15 - 41 U/L - 72(H) -  ALT 0 - 44 U/L - 42 -    CXR: subQ emphysema stable  _______________________________________________________________  Assessment and Plan: POD 1 s/p L RATS, LULectomy, thoracotomy  Neuro: adding dilaudid PCA.  Will start PO meds tomorrow CV: orthostasis.  Will give more fluid Pulm: pulm hygiene Renal: creat downtrending GI: on diet Heme: stable ID: afebrile Endo: SSI Dispo: continue ICU care.   Lajuana Matte 11/16/2021 7:03 AM

## 2021-11-16 NOTE — Consult Note (Signed)
NAME:  [name], MRN:  [id number], DOB:  1951-08-06, LOS: 1 ADMISSION DATE:  11/15/2021, CONSULTATION DATE:  11/16/2021 REFERRING MD:  Dr. Kipp Brood, CHIEF COMPLAINT:  Massive transfusion protocol    History of Present Illness:  [name] is a 70 y.o. male with a PMH significant for type 2 diabetes, dyslipidemia, and HTN who presented to [facility] for elective bronchoscopy and left upper lobectomy after an outpatient chest CT followed by PET revealed a spiculated left upper lobe nodule.   Per verbal repot intraoperatively patient suffered massive bleeding from pulmonary artery that resulted in massive transfusion (upward of 8 units of PRBC and possibly ? 5 units of FFP)   Pertinent  Medical History  Type 2 diabetes Dyslipidemia HTN   Significant Hospital Events: Including procedures, antibiotic start and stop dates in addition to other pertinent events   12/1 Elective left upper lobectomy with massive bleeding intraoperatively, PCCM consulted for medical management   12/1 extubated  Interim History / Subjective:  Still in a fair amount of pain.  PCA started.  Describes pain as deep incisional related discomfort.  Objective   Blood pressure 134/75, pulse 80, temperature 98.6 F (37 C), temperature source Oral, resp. rate 15, height 5\' 11"  (1.803 m), weight 92.5 kg, SpO2 99 %.    4 L nasal cannula.  Intake/Output Summary (Last 24 hours) at 11/16/2021 0824 Last data filed at 11/16/2021 0745 Gross per 24 hour  Intake 11038.82 ml  Output 4635 ml  Net 6403.82 ml    Filed Weights   11/15/21 0555 11/15/21 1416 11/16/21 0500  Weight: 89.8 kg 89.8 kg 92.5 kg    Examination: General: Pale.  In no distress.   HEENT: ETT, MM pink/moist, PERRL,  Neuro: Awake and following commands with no focal deficits CV: s1s2 regular rate and rhythm, no murmur, rubs, or gallops,  PULM: Visible splinting.  Chest tube on left side.  Diffuse rhonchi GI: Abdomen soft and  nontender Extremities: warm/dry, no edema  Skin: no rashes or lesions  Ancillary tests personally reviewed  Creatinine unchanged at 1.53 Mild leukocytosis 10.8. Hemoglobin 8.3. Thrombocytopenia 94. Chest x-ray shows bibasilar atelectasis, left chest tube in place.  No pneumothorax. Assessment & Plan:  Spiculated LUL pulmonary nodule  Status post robot-assisted left upper lobectomy Hemorrhagic  shock Acute blood loss anemia  DM  HTN   Plan:   -PCA for pain.  Initiate multimodal analgesia with APAP and gabapentin.  Hold on NSAID for now given baseline renal dysfunction.. -Incentive spirometry, progressive ambulation once off vasopressors. -Hold home antihypertensive medication.  Wean off norepinephrine to keep MAP>65.  -We will obtain iron indices.  Best Practice (right click and "Reselect all SmartList Selections" daily)   Diet/type: Regular consistency (see orders) DVT prophylaxis: SCD start chemical prophylaxis tomorrow GI prophylaxis: PPI Lines: N/A can remove once off norepinephrine. Foley:  Yes, and it is still needed Code Status:  full code Last date of multidisciplinary goals of care discussion: Per primary   Labs   CBC: Recent Labs  Lab 11/13/21 1000 11/15/21 1057 11/15/21 1239 11/15/21 1327 11/15/21 1450 11/16/21 0105 11/16/21 0427  WBC 8.5  --   --   --  13.2* 11.6* 10.8*  NEUTROABS  --   --   --   --   --  8.7*  --   HGB 13.8   < > 7.8* 9.9* 10.5* 8.6* 8.3*  HCT 41.1   < > 23.0* 29.0* 32.3* 24.6* 24.2*  MCV 93.8  --   --   --  90.2 86.3 86.4  PLT 200  --   --   --  69* 94* 94*   < > = values in this interval not displayed.     Basic Metabolic Panel: Recent Labs  Lab 11/13/21 1000 11/15/21 1057 11/15/21 1121 11/15/21 1208 11/15/21 1239 11/15/21 1327 11/15/21 1450 11/16/21 0427  NA 137 138 134* 138 138 138 139 139  K 4.2 5.3* 6.0* 4.9 6.0* 5.9* 4.6 3.7  CL 107 105 102  --   --   --  106 108  CO2 19*  --   --   --   --   --  20* 24   GLUCOSE 145* 195* 205*  --   --   --  247* 140*  BUN 24* 25* 26*  --   --   --  23 26*  CREATININE 1.58* 1.50* 1.60*  --   --   --  1.66* 1.53*  CALCIUM 9.2  --   --   --   --   --  8.1* 8.1*    GFR: Estimated Creatinine Clearance: 53 mL/min (A) (by C-G formula based on SCr of 1.53 mg/dL (H)). Recent Labs  Lab 11/13/21 1000 11/15/21 1450 11/16/21 0105 11/16/21 0427  WBC 8.5 13.2* 11.6* 10.8*     Liver Function Tests: Recent Labs  Lab 11/13/21 1000 11/15/21 1450  AST 25 72*  ALT 19 42  ALKPHOS 73 36*  BILITOT 0.9 1.8*  PROT 7.4 4.7*  ALBUMIN 4.1 3.0*    No results for input(s): LIPASE, AMYLASE in the last 168 hours. No results for input(s): AMMONIA in the last 168 hours.  ABG    Component Value Date/Time   PHART 7.389 11/15/2021 1327   PCO2ART 34.7 11/15/2021 1327   PO2ART 322 (H) 11/15/2021 1327   HCO3 21.4 11/15/2021 1327   TCO2 23 11/15/2021 1327   ACIDBASEDEF 4.0 (H) 11/15/2021 1327   O2SAT 100.0 11/15/2021 1327      Coagulation Profile: Recent Labs  Lab 11/13/21 1000  INR 0.9     Cardiac Enzymes: No results for input(s): CKTOTAL, CKMB, CKMBINDEX, TROPONINI in the last 168 hours.  HbA1C: No results found for: HGBA1C  CBG: Recent Labs  Lab 11/15/21 0557 11/15/21 1628 11/15/21 2002 11/16/21 0052 11/16/21 0740  GLUCAP 153* 230* 150* 139* 154*   Kipp Brood, MD [facility] ICU Physician [place]  Pager: [phone] Or Epic Secure Chat After hours: [phone].  11/16/2021, 8:46 AM

## 2021-11-17 ENCOUNTER — Inpatient Hospital Stay (HOSPITAL_COMMUNITY): Payer: Medicare Other

## 2021-11-17 LAB — CBC
HCT: 21.9 % — ABNORMAL LOW (ref 39.0–52.0)
Hemoglobin: 7.2 g/dL — ABNORMAL LOW (ref 13.0–17.0)
MCH: 29.5 pg (ref 26.0–34.0)
MCHC: 32.9 g/dL (ref 30.0–36.0)
MCV: 89.8 fL (ref 80.0–100.0)
Platelets: 86 10*3/uL — ABNORMAL LOW (ref 150–400)
RBC: 2.44 MIL/uL — ABNORMAL LOW (ref 4.22–5.81)
RDW: 16.1 % — ABNORMAL HIGH (ref 11.5–15.5)
WBC: 11.3 10*3/uL — ABNORMAL HIGH (ref 4.0–10.5)
nRBC: 0 % (ref 0.0–0.2)

## 2021-11-17 LAB — COMPREHENSIVE METABOLIC PANEL
ALT: 24 U/L (ref 0–44)
AST: 40 U/L (ref 15–41)
Albumin: 3 g/dL — ABNORMAL LOW (ref 3.5–5.0)
Alkaline Phosphatase: 39 U/L (ref 38–126)
Anion gap: 7 (ref 5–15)
BUN: 22 mg/dL (ref 8–23)
CO2: 25 mmol/L (ref 22–32)
Calcium: 8 mg/dL — ABNORMAL LOW (ref 8.9–10.3)
Chloride: 105 mmol/L (ref 98–111)
Creatinine, Ser: 1.25 mg/dL — ABNORMAL HIGH (ref 0.61–1.24)
GFR, Estimated: >60 mL/min (ref >60)
Glucose, Bld: 134 mg/dL — ABNORMAL HIGH (ref 70–99)
Potassium: 3.9 mmol/L (ref 3.5–5.1)
Sodium: 137 mmol/L (ref 135–145)
Total Bilirubin: 1 mg/dL (ref 0.3–1.2)
Total Protein: 5 g/dL — ABNORMAL LOW (ref 6.5–8.1)

## 2021-11-17 LAB — FERRITIN: Ferritin: 314 ng/mL (ref 24–336)

## 2021-11-17 LAB — GLUCOSE, CAPILLARY
Glucose-Capillary: 113 mg/dL — ABNORMAL HIGH (ref 70–99)
Glucose-Capillary: 120 mg/dL — ABNORMAL HIGH (ref 70–99)
Glucose-Capillary: 130 mg/dL — ABNORMAL HIGH (ref 70–99)
Glucose-Capillary: 135 mg/dL — ABNORMAL HIGH (ref 70–99)
Glucose-Capillary: 141 mg/dL — ABNORMAL HIGH (ref 70–99)
Glucose-Capillary: 143 mg/dL — ABNORMAL HIGH (ref 70–99)
Glucose-Capillary: 171 mg/dL — ABNORMAL HIGH (ref 70–99)

## 2021-11-17 LAB — RETICULOCYTES
Immature Retic Fract: 14.4 % (ref 2.3–15.9)
RBC.: 2.41 MIL/uL — ABNORMAL LOW (ref 4.22–5.81)
Retic Count, Absolute: 55.2 10*3/uL (ref 19.0–186.0)
Retic Ct Pct: 2.3 % (ref 0.4–3.1)

## 2021-11-17 LAB — IRON AND TIBC
Iron: 28 ug/dL — ABNORMAL LOW (ref 45–182)
Saturation Ratios: 10 % — ABNORMAL LOW (ref 17.9–39.5)
TIBC: 276 ug/dL (ref 250–450)
UIBC: 248 ug/dL

## 2021-11-17 LAB — VITAMIN B12: Vitamin B-12: 182 pg/mL (ref 180–914)

## 2021-11-17 LAB — FOLATE: Folate: 6.7 ng/mL (ref >5.9)

## 2021-11-17 LAB — PREPARE RBC (CROSSMATCH)

## 2021-11-17 MED ORDER — HYDROMORPHONE HCL 1 MG/ML IJ SOLN
0.5000 mg | INTRAMUSCULAR | Status: DC | PRN
  Administered 2021-11-17 – 2021-11-22 (×19): 0.5 mg via INTRAVENOUS
  Filled 2021-11-17 (×19): qty 0.5

## 2021-11-17 MED ORDER — MIDODRINE HCL 5 MG PO TABS
10.0000 mg | ORAL_TABLET | Freq: Three times a day (TID) | ORAL | Status: DC
  Administered 2021-11-17 – 2021-11-18 (×3): 10 mg via ORAL
  Filled 2021-11-17 (×3): qty 2

## 2021-11-17 MED ORDER — ENOXAPARIN SODIUM 40 MG/0.4ML IJ SOSY
40.0000 mg | PREFILLED_SYRINGE | INTRAMUSCULAR | Status: DC
  Administered 2021-11-17 – 2021-11-22 (×5): 40 mg via SUBCUTANEOUS
  Filled 2021-11-17 (×5): qty 0.4

## 2021-11-17 NOTE — Progress Notes (Signed)
      [address]       [place] [address]             [phone]                 2 Days Post-Op Procedure(s) (LRB): ROBOTIC ASSISTED NAVIGATIONAL BRONCHOSCOPY (N/A) FIDUCIAL DYE MARKING VIDEO BRONCHOSCOPY WITH RADIAL ENDOBRONCHIAL ULTRASOUND BRONCHIAL BIOPSIES BRONCHIAL BRUSHINGS   Events:  orthostasis _______________________________________________________________ Vitals: BP (!) 124/59   Pulse 80   Temp 97.9 F (36.6 C) (Oral)   Resp 18   Ht 5\' 11"  (1.803 m)   Wt 92.5 kg   SpO2 95%   BMI 28.44 kg/m  Filed Weights   11/15/21 0555 11/15/21 1416 11/16/21 0500  Weight: 89.8 kg 89.8 kg 92.5 kg     - Neuro: alert NAD.    - Cardiovascular: sinus  Drips: levo 1      - Pulm: EWOB. Small 1 column air leak   ABG    Component Value Date/Time   PHART 7.389 11/15/2021 1327   PCO2ART 34.7 11/15/2021 1327   PO2ART 322 (H) 11/15/2021 1327   HCO3 21.4 11/15/2021 1327   TCO2 23 11/15/2021 1327   ACIDBASEDEF 4.0 (H) 11/15/2021 1327   O2SAT 100.0 11/15/2021 1327    - Abd: ND - Extremity: warm, edematous  .Intake/Output      12/02 0701 12/03 0700 12/03 0701 12/04 0700   P.O.     I.V. (mL/kg) 998.7 (10.8)    Blood     IV Piggyback 150    Total Intake(mL/kg) 1148.8 (12.4)    Urine (mL/kg/hr) 750 (0.3)    Blood     Chest Tube 600    Total Output 1350    Net -201.2            _______________________________________________________________ Labs: CBC Latest Ref Rng & Units 11/17/2021 11/16/2021 11/16/2021  WBC 4.0 - 10.5 K/uL 11.3(H) 10.8(H) 11.6(H)  Hemoglobin 13.0 - 17.0 g/dL 7.2(L) 8.3(L) 8.6(L)  Hematocrit 39.0 - 52.0 % 21.9(L) 24.2(L) 24.6(L)  Platelets 150 - 400 K/uL 86(L) 94(L) 94(L)   CMP Latest Ref Rng & Units 11/17/2021 11/16/2021 11/15/2021  Glucose 70 - 99 mg/dL 134(H) 140(H) 247(H)  BUN 8 - 23 mg/dL 22 26(H) 23  Creatinine 0.61 - 1.24 mg/dL 1.25(H) 1.53(H) 1.66(H)  Sodium 135 - 145 mmol/L 137 139 139  Potassium 3.5 - 5.1  mmol/L 3.9 3.7 4.6  Chloride 98 - 111 mmol/L 105 108 106  CO2 22 - 32 mmol/L 25 24 20(L)  Calcium 8.9 - 10.3 mg/dL 8.0(L) 8.1(L) 8.1(L)  Total Protein 6.5 - 8.1 g/dL 5.0(L) - 4.7(L)  Total Bilirubin 0.3 - 1.2 mg/dL 1.0 - 1.8(H)  Alkaline Phos 38 - 126 U/L 39 - 36(L)  AST 15 - 41 U/L 40 - 72(H)  ALT 0 - 44 U/L 24 - 42    CXR: - _______________________________________________________________  Assessment and Plan: POD 2 s/p L RATS, LULectomy, thoracotomy  Neuro: pain better controlled CV: orthostasis.  Will give 1 unit of pRBC.  Wean levo.  Will start midodrine Pulm: pulm hygiene Renal: creat downtrending GI: on diet Heme: stable ID: afebrile Endo: SSI Dispo: continue ICU care.   Lajuana Matte 11/17/2021 7:48 AM

## 2021-11-18 ENCOUNTER — Encounter (HOSPITAL_COMMUNITY): Payer: Self-pay | Admitting: Pulmonary Disease

## 2021-11-18 LAB — BASIC METABOLIC PANEL
Anion gap: 4 — ABNORMAL LOW (ref 5–15)
BUN: 16 mg/dL (ref 8–23)
CO2: 26 mmol/L (ref 22–32)
Calcium: 7.9 mg/dL — ABNORMAL LOW (ref 8.9–10.3)
Chloride: 107 mmol/L (ref 98–111)
Creatinine, Ser: 0.93 mg/dL (ref 0.61–1.24)
GFR, Estimated: >60 mL/min (ref >60)
Glucose, Bld: 109 mg/dL — ABNORMAL HIGH (ref 70–99)
Potassium: 3.4 mmol/L — ABNORMAL LOW (ref 3.5–5.1)
Sodium: 137 mmol/L (ref 135–145)

## 2021-11-18 LAB — CBC
HCT: 21.6 % — ABNORMAL LOW (ref 39.0–52.0)
Hemoglobin: 7 g/dL — ABNORMAL LOW (ref 13.0–17.0)
MCH: 29.5 pg (ref 26.0–34.0)
MCHC: 32.4 g/dL (ref 30.0–36.0)
MCV: 91.1 fL (ref 80.0–100.0)
Platelets: 87 10*3/uL — ABNORMAL LOW (ref 150–400)
RBC: 2.37 MIL/uL — ABNORMAL LOW (ref 4.22–5.81)
RDW: 15.8 % — ABNORMAL HIGH (ref 11.5–15.5)
WBC: 8.8 10*3/uL (ref 4.0–10.5)
nRBC: 0 % (ref 0.0–0.2)

## 2021-11-18 LAB — GLUCOSE, CAPILLARY
Glucose-Capillary: 102 mg/dL — ABNORMAL HIGH (ref 70–99)
Glucose-Capillary: 111 mg/dL — ABNORMAL HIGH (ref 70–99)
Glucose-Capillary: 117 mg/dL — ABNORMAL HIGH (ref 70–99)
Glucose-Capillary: 124 mg/dL — ABNORMAL HIGH (ref 70–99)
Glucose-Capillary: 158 mg/dL — ABNORMAL HIGH (ref 70–99)
Glucose-Capillary: 161 mg/dL — ABNORMAL HIGH (ref 70–99)

## 2021-11-18 LAB — PREPARE RBC (CROSSMATCH)

## 2021-11-18 MED ORDER — SODIUM CHLORIDE 0.9% IV SOLUTION: Freq: Once | INTRAVENOUS | Status: AC

## 2021-11-18 MED ORDER — SODIUM CHLORIDE 0.9% FLUSH: 3.0000 mL | INTRAVENOUS | Status: DC | PRN

## 2021-11-18 MED ORDER — SODIUM CHLORIDE 0.9% FLUSH
3.0000 mL | Freq: Two times a day (BID) | INTRAVENOUS | Status: DC
  Administered 2021-11-18 – 2021-11-23 (×10): 3 mL via INTRAVENOUS

## 2021-11-18 MED ORDER — ~~LOC~~ CARDIAC SURGERY, PATIENT & FAMILY EDUCATION: Freq: Once | Status: AC

## 2021-11-18 MED ORDER — POTASSIUM CHLORIDE CRYS ER 20 MEQ PO TBCR
40.0000 meq | EXTENDED_RELEASE_TABLET | Freq: Every day | ORAL | Status: DC
  Administered 2021-11-18 – 2021-11-19 (×2): 40 meq via ORAL
  Filled 2021-11-18 (×2): qty 2

## 2021-11-18 MED ORDER — MIDODRINE HCL 5 MG PO TABS
5.0000 mg | ORAL_TABLET | Freq: Three times a day (TID) | ORAL | Status: DC
  Administered 2021-11-18 – 2021-11-19 (×4): 5 mg via ORAL
  Filled 2021-11-18 (×5): qty 1

## 2021-11-18 MED ORDER — POTASSIUM CHLORIDE CRYS ER 20 MEQ PO TBCR
20.0000 meq | EXTENDED_RELEASE_TABLET | ORAL | Status: AC
  Administered 2021-11-18 (×2): 20 meq via ORAL
  Filled 2021-11-18: qty 1

## 2021-11-18 MED ORDER — SODIUM CHLORIDE 0.9 % IV SOLN: 250.0000 mL | INTRAVENOUS | Status: DC | PRN

## 2021-11-18 NOTE — Progress Notes (Signed)
      [address]       [place] [address]             [phone]                 3 Days Post-Op Procedure(s) (LRB): ROBOTIC ASSISTED NAVIGATIONAL BRONCHOSCOPY (N/A) FIDUCIAL DYE MARKING VIDEO BRONCHOSCOPY WITH RADIAL ENDOBRONCHIAL ULTRASOUND BRONCHIAL BIOPSIES BRONCHIAL BRUSHINGS   Events: No events Received 1 unit of blood thisam _______________________________________________________________ Vitals: BP (!) 162/74   Pulse (!) 115   Temp 98.9 F (37.2 C) (Oral)   Resp (!) 21   Ht 5\' 11"  (1.803 m)   Wt 92.5 kg   SpO2 94%   BMI 28.44 kg/m  Filed Weights   11/15/21 0555 11/15/21 1416 11/16/21 0500  Weight: 89.8 kg 89.8 kg 92.5 kg     - Neuro: alert NAD.    - Cardiovascular: sinus  Drips:     - Pulm: EWOB. No leak with cough   ABG    Component Value Date/Time   PHART 7.389 11/15/2021 1327   PCO2ART 34.7 11/15/2021 1327   PO2ART 322 (H) 11/15/2021 1327   HCO3 21.4 11/15/2021 1327   TCO2 23 11/15/2021 1327   ACIDBASEDEF 4.0 (H) 11/15/2021 1327   O2SAT 100.0 11/15/2021 1327    - Abd: ND - Extremity: warm, edematous  .Intake/Output      12/03 0701 12/04 0700 12/04 0701 12/05 0700   P.O. 960 360   I.V. (mL/kg) 1447.6 (15.7) 249.6 (2.7)   Blood 320.8 262.5   IV Piggyback     Total Intake(mL/kg) 2728.5 (29.5) 872.1 (9.4)   Urine (mL/kg/hr) 1000 (0.5)    Chest Tube 550    Total Output 1550    Net +1178.5 +872.1           _______________________________________________________________ Labs: CBC Latest Ref Rng & Units 11/18/2021 11/17/2021 11/16/2021  WBC 4.0 - 10.5 K/uL 8.8 11.3(H) 10.8(H)  Hemoglobin 13.0 - 17.0 g/dL 7.0(L) 7.2(L) 8.3(L)  Hematocrit 39.0 - 52.0 % 21.6(L) 21.9(L) 24.2(L)  Platelets 150 - 400 K/uL 87(L) 86(L) 94(L)   CMP Latest Ref Rng & Units 11/18/2021 11/17/2021 11/16/2021  Glucose 70 - 99 mg/dL 109(H) 134(H) 140(H)  BUN 8 - 23 mg/dL 16 22 26(H)  Creatinine 0.61 - 1.24 mg/dL 0.93 1.25(H) 1.53(H)  Sodium 135  - 145 mmol/L 137 137 139  Potassium 3.5 - 5.1 mmol/L 3.4(L) 3.9 3.7  Chloride 98 - 111 mmol/L 107 105 108  CO2 22 - 32 mmol/L 26 25 24   Calcium 8.9 - 10.3 mg/dL 7.9(L) 8.0(L) 8.1(L)  Total Protein 6.5 - 8.1 g/dL - 5.0(L) -  Total Bilirubin 0.3 - 1.2 mg/dL - 1.0 -  Alkaline Phos 38 - 126 U/L - 39 -  AST 15 - 41 U/L - 40 -  ALT 0 - 44 U/L - 24 -    CXR: - _______________________________________________________________  Assessment and Plan: POD 3 s/p L RATS, LULectomy, thoracotomy  Neuro: pain better controlled CV: orthostasis.  Will give 1 unit of pRBC.  weaning midodrine Pulm: pulm hygiene.  Chest tube out tomorrow Renal: creat downtrending GI: on diet Heme: stable ID: afebrile Endo: SSI Dispo: floor today   Lajuana Matte 11/18/2021 10:16 AM

## 2021-11-19 ENCOUNTER — Inpatient Hospital Stay (HOSPITAL_COMMUNITY): Payer: Medicare Other

## 2021-11-19 ENCOUNTER — Encounter (HOSPITAL_COMMUNITY): Payer: Self-pay | Admitting: Thoracic Surgery (Cardiothoracic Vascular Surgery)

## 2021-11-19 LAB — BPAM RBC
Blood Product Expiration Date: 202212182359
Blood Product Expiration Date: 202212182359
Blood Product Expiration Date: 202212202359
Blood Product Expiration Date: 202212212359
Blood Product Expiration Date: 202212212359
Blood Product Expiration Date: 202212212359
Blood Product Expiration Date: 202212212359
Blood Product Expiration Date: 202212222359
Blood Product Expiration Date: 202212242359
Blood Product Expiration Date: 202212242359
Blood Product Expiration Date: 202212292359
Blood Product Expiration Date: 202212292359
Blood Product Expiration Date: 202212292359
Blood Product Expiration Date: 202212292359
Blood Product Expiration Date: 202212292359
Blood Product Expiration Date: 202212292359
Blood Product Expiration Date: 202212292359
Blood Product Expiration Date: 202212292359
Blood Product Expiration Date: 202301022359
ISSUE DATE / TIME: 202212011048
ISSUE DATE / TIME: 202212011048
ISSUE DATE / TIME: 202212011127
ISSUE DATE / TIME: 202212011127
ISSUE DATE / TIME: 202212011127
ISSUE DATE / TIME: 202212011127
ISSUE DATE / TIME: 202212011140
ISSUE DATE / TIME: 202212011703
ISSUE DATE / TIME: 202212020754
ISSUE DATE / TIME: 202212020754
ISSUE DATE / TIME: 202212020810
ISSUE DATE / TIME: 202212030812
ISSUE DATE / TIME: 202212030819
ISSUE DATE / TIME: 202212031006
ISSUE DATE / TIME: 202212031352
ISSUE DATE / TIME: 202212040548
ISSUE DATE / TIME: 202212051017
ISSUE DATE / TIME: 202212051017
Unit Type and Rh: 5100
Unit Type and Rh: 5100
Unit Type and Rh: 5100
Unit Type and Rh: 5100
Unit Type and Rh: 5100
Unit Type and Rh: 5100
Unit Type and Rh: 5100
Unit Type and Rh: 5100
Unit Type and Rh: 7300
Unit Type and Rh: 7300
Unit Type and Rh: 7300
Unit Type and Rh: 7300
Unit Type and Rh: 7300
Unit Type and Rh: 7300
Unit Type and Rh: 7300
Unit Type and Rh: 7300
Unit Type and Rh: 7300
Unit Type and Rh: 7300
Unit Type and Rh: 7300

## 2021-11-19 LAB — BASIC METABOLIC PANEL
Anion gap: 6 (ref 5–15)
BUN: 12 mg/dL (ref 8–23)
CO2: 24 mmol/L (ref 22–32)
Calcium: 8.3 mg/dL — ABNORMAL LOW (ref 8.9–10.3)
Chloride: 107 mmol/L (ref 98–111)
Creatinine, Ser: 1.01 mg/dL (ref 0.61–1.24)
GFR, Estimated: >60 mL/min (ref >60)
Glucose, Bld: 127 mg/dL — ABNORMAL HIGH (ref 70–99)
Potassium: 3.5 mmol/L (ref 3.5–5.1)
Sodium: 137 mmol/L (ref 135–145)

## 2021-11-19 LAB — TYPE AND SCREEN
ABO/RH(D): B POS
Antibody Screen: NEGATIVE
Unit division: 0
Unit division: 0
Unit division: 0
Unit division: 0
Unit division: 0
Unit division: 0
Unit division: 0
Unit division: 0
Unit division: 0
Unit division: 0
Unit division: 0
Unit division: 0
Unit division: 0
Unit division: 0
Unit division: 0
Unit division: 0
Unit division: 0
Unit division: 0
Unit division: 0

## 2021-11-19 LAB — CBC
HCT: 25.9 % — ABNORMAL LOW (ref 39.0–52.0)
Hemoglobin: 8.6 g/dL — ABNORMAL LOW (ref 13.0–17.0)
MCH: 29.9 pg (ref 26.0–34.0)
MCHC: 33.2 g/dL (ref 30.0–36.0)
MCV: 89.9 fL (ref 80.0–100.0)
Platelets: 125 10*3/uL — ABNORMAL LOW (ref 150–400)
RBC: 2.88 MIL/uL — ABNORMAL LOW (ref 4.22–5.81)
RDW: 15.4 % (ref 11.5–15.5)
WBC: 8.3 10*3/uL (ref 4.0–10.5)
nRBC: 0 % (ref 0.0–0.2)

## 2021-11-19 LAB — GLUCOSE, CAPILLARY
Glucose-Capillary: 111 mg/dL — ABNORMAL HIGH (ref 70–99)
Glucose-Capillary: 118 mg/dL — ABNORMAL HIGH (ref 70–99)
Glucose-Capillary: 128 mg/dL — ABNORMAL HIGH (ref 70–99)
Glucose-Capillary: 136 mg/dL — ABNORMAL HIGH (ref 70–99)
Glucose-Capillary: 158 mg/dL — ABNORMAL HIGH (ref 70–99)
Glucose-Capillary: 158 mg/dL — ABNORMAL HIGH (ref 70–99)

## 2021-11-19 LAB — SURGICAL PATHOLOGY

## 2021-11-19 MED ORDER — HYDROMORPHONE HCL 1 MG/ML IJ SOLN
0.5000 mg | Freq: Once | INTRAMUSCULAR | Status: AC
  Administered 2021-11-19: 0.5 mg via INTRAVENOUS
  Filled 2021-11-19: qty 0.5

## 2021-11-19 MED ORDER — FUROSEMIDE 10 MG/ML IJ SOLN
40.0000 mg | Freq: Once | INTRAMUSCULAR | Status: AC
  Administered 2021-11-19: 40 mg via INTRAVENOUS
  Filled 2021-11-19: qty 4

## 2021-11-19 NOTE — Evaluation (Signed)
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 The pt was immediately taken for L upper lobectomy whih required thoacotomy of the same region. Pt with significant blood loss during surgery requiring 8 units PRBC, possibly 5 units FFP, resuscitation in OR and intubation, extuabted later the same day 12/1. PMH includes: DM II and HTN.   Clinical Impression  Pt in bed upon arrival of PT, agreeable to evaluation at this time. 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 The pt now presents with limitations in functional mobility, endurance, and dynamic stablity due to above dx, and will continue to benefit from skilled PT to address these deficits. The pt required minG to steady with both initial sit-stand and gait, and benefited from use of 4-wheel walker for stability at this time. Gait progression limited by HR elevation with short bout of gait in the room, but the pt will benefit from continued skilled PT to progress activity tolerance, wean O2, and further challenge dynamic stability in order to reduce risk of falls after return home. Will be safe to return home with family support but may benefit from OPPT if deficits persist.         Recommendations for follow up therapy are one component of a multi-disciplinary discharge planning process, led by the attending physician.  Recommendations may be updated based on patient status, additional functional criteria and insurance authorization.  Follow Up Recommendations Outpatient PT (vs no PT pending progress)    Assistance Recommended at Discharge Intermittent Supervision/Assistance  Functional Status Assessment Patient has had a recent decline in their  functional status and demonstrates the ability to make significant improvements in function in a reasonable and predictable amount of time.  Equipment Recommendations  Rollator (4 wheels)    Recommendations for Other Services       Precautions / Restrictions Precautions Precautions: Fall;Other (comment) Precaution Comments: watch SpO2 Restrictions Weight Bearing Restrictions: No      Mobility  Bed Mobility Overal bed mobility: Modified Independent             General bed mobility comments: pt able to move to EOB with HOB up but no assist, already sitting EOB upon my arrival    Transfers Overall transfer level: Needs assistance Equipment used: Rollator (4 wheels) Transfers: Sit to/from Stand Sit to Stand: Min guard           General transfer comment: minG for safety, cues for use of safety features and hand placement    Ambulation/Gait Ambulation/Gait assistance: Min guard Gait Distance (Feet): 20 Feet Assistive device: Rollator (4 wheels) Gait Pattern/deviations: Step-through pattern;Decreased stride length;Trunk flexed Gait velocity: decreased Gait velocity interpretation: <1.31 ft/sec, indicative of household ambulator   General Gait Details: slight trunk flexion with gait, good stability with BUE support. HR to 145 bpm with activity      Balance Overall balance assessment: Mild deficits observed, not formally tested                                           Pertinent Vitals/Pain Pain Assessment: Faces Faces Pain Scale: Hurts even more  Pain Location: incision sites Pain Descriptors / Indicators: Discomfort Pain Intervention(s): Limited activity within patient's tolerance;Monitored during session;Repositioned    Home Living Family/patient expects to be discharged to:: Private residence Living Arrangements: Spouse/significant other Available Help at Discharge: Family;Available 24 hours/day Type of Home: House Home Access: Stairs  to enter Entrance Stairs-Rails: Psychiatric nurse of Steps: 4   Home Layout: One level Home Equipment: Shower seat Additional Comments: pt reports wife uses a cane    Prior Function Prior Level of Function : Independent/Modified Independent             Mobility Comments: 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 ADLs Comments: independent, completes all IADLs at the home     Hand Dominance   Dominant Hand: Right    Extremity/Trunk Assessment   Upper Extremity Assessment Upper Extremity Assessment: Overall WFL for tasks assessed    Lower Extremity Assessment Lower Extremity Assessment: Overall WFL for tasks assessed    Cervical / Trunk Assessment Cervical / Trunk Assessment: Normal;Other exceptions (thoracic surgery) Cervical / Trunk Exceptions: s/p thoracic surgery (L lung)  Communication   Communication: No difficulties  Cognition Arousal/Alertness: Awake/alert Behavior During Therapy: WFL for tasks assessed/performed Overall Cognitive Status: Within Functional Limits for tasks assessed                                          General Comments General comments (skin integrity, edema, etc.): HR elevated to 145 bpm with activity, recovered to 90s. SpO2 remained stable on 2L with mobility and at rest    Exercises Other Exercises Other Exercises: sit-stand from recliner without UE support x3   Assessment/Plan    PT Assessment Patient needs continued PT services  PT Problem List Decreased range of motion;Decreased balance;Decreased activity tolerance;Decreased mobility       PT Treatment Interventions DME instruction;Gait training;Stair training;Functional mobility training;Therapeutic exercise;Therapeutic activities;Balance training;Patient/family education    PT Goals (Current goals can be found in the Care Plan section)  Acute Rehab PT Goals Patient Stated Goal: return home PT Goal Formulation: With patient Time For  Goal Achievement: 12/03/21 Potential to Achieve Goals: Good    Frequency Min 3X/week    AM-PAC PT "6 Clicks" Mobility  Outcome Measure Help needed turning from your back to your side while in a flat bed without using bedrails?: None Help needed moving from lying on your back to sitting on the side of a flat bed without using bedrails?: None Help needed moving to and from a bed to a chair (including a wheelchair)?: A Little Help needed standing up from a chair using your arms (e.g., wheelchair or bedside chair)?: A Little Help needed to walk in hospital room?: A Little Help needed climbing 3-5 steps with a railing? : A Little 6 Click Score: 20    End of Session Equipment Utilized During Treatment: Gait belt;Oxygen Activity Tolerance: Patient tolerated treatment well Patient left: in chair;with call bell/phone within reach;with chair alarm set Nurse Communication: Mobility status PT Visit Diagnosis: Other abnormalities of gait and mobility (R26.89);Unsteadiness on feet (R26.81);Muscle weakness (generalized) (M62.81)    Time: 7672-0947 PT Time Calculation (min) (ACUTE ONLY): 20 min   Charges:   PT Evaluation $PT Eval Low Complexity: 1 Low          West Carbo, PT, DPT   Acute Rehabilitation Department Pager #: [phone]  Karma Ganja  Zhou 11/19/2021, 6:02 PM

## 2021-11-19 NOTE — Progress Notes (Addendum)
TCTS DAILY ICU PROGRESS NOTE                   [address]            [place] [address]          [phone]   Total Length of Stay:  LOS: 4 days  POD#4 - Robotic assisted left video thoracoscopy -Emergency thoracotomy -Left upper lobectomy - Mediastinal lymph node sampling - Intercostal nerve block - Rib plating x2 Subjective: Some SOB/Pain, slow and steady overall improvement  Objective: Vital signs in last 24 hours: Temp:  [98.1 F (36.7 C)-99.2 F (37.3 C)] 99.1 F (37.3 C) (12/05 0400) Pulse Rate:  [108] 108 (12/05 0400) Cardiac Rhythm: Normal sinus rhythm;Sinus tachycardia (12/05 0400) Resp:  [12-28] 18 (12/05 0600) BP: (125-177)/(70-104) 154/87 (12/05 0600) SpO2:  [89 %-100 %] 98 % (12/05 0600) Weight:  [101.4 kg] 101.4 kg (12/05 0500)  Filed Weights   11/15/21 1416 11/16/21 0500 11/19/21 0500  Weight: 89.8 kg 92.5 kg 101.4 kg    Weight change:    Hemodynamic parameters for last 24 hours:    Intake/Output from previous day: 12/04 0701 - 12/05 0700 In: 1702.1 [P.O.:1190; I.V.:249.6; Blood:262.5] Out: 1540 [Urine:1200; Chest Tube:340]  Intake/Output this shift: No intake/output data recorded.  Current Meds: Scheduled Meds:  acetaminophen  1,000 mg Oral Q6H   Or   acetaminophen (TYLENOL) oral liquid 160 mg/5 mL  1,000 mg Oral Q6H   bisacodyl  10 mg Oral Daily   Chlorhexidine Gluconate Cloth  6 each Topical Daily   enoxaparin (LOVENOX) injection  40 mg Subcutaneous Q24H   gabapentin  300 mg Oral BID   insulin aspart  0-24 Units Subcutaneous Q4H   midodrine  5 mg Oral TID WC   polyethylene glycol  17 g Per Tube Daily   potassium chloride  40 mEq Oral Daily   senna-docusate  1 tablet Oral QHS   sodium chloride flush  3 mL Intravenous Q12H   Continuous Infusions:  sodium chloride 50 mL/hr at 11/18/21 1000   sodium chloride     PRN Meds:.sodium chloride, HYDROmorphone (DILAUDID) injection, ondansetron (ZOFRAN) IV, sodium chloride  flush, traMADol  General appearance: alert, cooperative, and no distress Heart: regular rate and rhythm Lungs: clear to auscultation bilaterally Abdomen: benign Extremities: no edema or calf tenderness Wound: incis healing well, mod echymosis  Lab Results: CBC: Recent Labs    11/18/21 0439 11/19/21 0458  WBC 8.8 8.3  HGB 7.0* 8.6*  HCT 21.6* 25.9*  PLT 87* 125*   BMET:  Recent Labs    11/18/21 0439 11/19/21 0458  NA 137 137  K 3.4* 3.5  CL 107 107  CO2 26 24  GLUCOSE 109* 127*  BUN 16 12  CREATININE 0.93 1.01  CALCIUM 7.9* 8.3*    CMET: Lab Results  Component Value Date   WBC 8.3 11/19/2021   HGB 8.6 (L) 11/19/2021   HCT 25.9 (L) 11/19/2021   PLT 125 (L) 11/19/2021   GLUCOSE 127 (H) 11/19/2021   ALT 24 11/17/2021   AST 40 11/17/2021   NA 137 11/19/2021   K 3.5 11/19/2021   CL 107 11/19/2021   CREATININE 1.01 11/19/2021   BUN 12 11/19/2021   CO2 24 11/19/2021   INR 0.9 11/13/2021      PT/INR: No results for input(s): LABPROT, INR in the last 72 hours. Radiology: No results found.   Assessment/Plan: POD#4  1 Tmax 99.2, S BP 125-177,  SR/ST 2 sats good on 5 liters 3 CT 340 cc/24 h, serosang, no air leak, placed to H2O seal, will repeat CXR at noon 4 good UOP, creat 1.01 5 Anemia improved after transfusion H/H 8.6/25.9 6 CXR- stable in appearance 7 lovenox for DVT ppx 8 CBG control pretty good- resume metformin at d/c 9 pulm hygiene, wean O2 off 10 mobilize as able- will ask PT to assist     John Giovanni PA-C Pager [phone] 11/19/2021 7:07 AM   Agree with above Will titrate BB and wean midodrine Will diurese some today Will place CT to [facility] Awaiting floor bed  Elmo Rio O Loriene Taunton

## 2021-11-19 NOTE — Progress Notes (Signed)
Received order however pt does not have a CR appropriate diagnosis. Will not follow. Yves Dill CES, ACSM 7:12 AM 11/19/2021

## 2021-11-20 ENCOUNTER — Inpatient Hospital Stay (HOSPITAL_COMMUNITY): Payer: Medicare Other

## 2021-11-20 LAB — BPAM FFP
Blood Product Expiration Date: 202212052359
Blood Product Expiration Date: 202212052359
Blood Product Expiration Date: 202212062359
Blood Product Expiration Date: 202212062359
Blood Product Expiration Date: 202212062359
Blood Product Expiration Date: 202212062359
Blood Product Expiration Date: 202212062359
Blood Product Expiration Date: 202212062359
Blood Product Expiration Date: 202212062359
Blood Product Expiration Date: 202212062359
Blood Product Expiration Date: 202212212359
Blood Product Expiration Date: 202212212359
Blood Product Expiration Date: 202212212359
Blood Product Expiration Date: 202212212359
Blood Product Expiration Date: 202212222359
Blood Product Expiration Date: 202212222359
ISSUE DATE / TIME: 202212011133
ISSUE DATE / TIME: 202212011133
ISSUE DATE / TIME: 202212011133
ISSUE DATE / TIME: 202212011133
ISSUE DATE / TIME: 202212011144
ISSUE DATE / TIME: 202212011144
ISSUE DATE / TIME: 202212011228
ISSUE DATE / TIME: 202212021149
ISSUE DATE / TIME: 202212021149
ISSUE DATE / TIME: 202212021149
ISSUE DATE / TIME: 202212021149
ISSUE DATE / TIME: 202212021149
ISSUE DATE / TIME: 202212021149
ISSUE DATE / TIME: 202212021149
ISSUE DATE / TIME: 202212021149
ISSUE DATE / TIME: 202212021149
Unit Type and Rh: 2800
Unit Type and Rh: 2800
Unit Type and Rh: 2800
Unit Type and Rh: 600
Unit Type and Rh: 600
Unit Type and Rh: 600
Unit Type and Rh: 6200
Unit Type and Rh: 6200
Unit Type and Rh: 6200
Unit Type and Rh: 6200
Unit Type and Rh: 6200
Unit Type and Rh: 7300
Unit Type and Rh: 7300
Unit Type and Rh: 8400
Unit Type and Rh: 8400
Unit Type and Rh: 8400

## 2021-11-20 LAB — PREPARE FRESH FROZEN PLASMA
Unit division: 0
Unit division: 0
Unit division: 0
Unit division: 0
Unit division: 0
Unit division: 0
Unit division: 0
Unit division: 0
Unit division: 0
Unit division: 0
Unit division: 0

## 2021-11-20 LAB — BASIC METABOLIC PANEL
Anion gap: 9 (ref 5–15)
BUN: 12 mg/dL (ref 8–23)
CO2: 24 mmol/L (ref 22–32)
Calcium: 8.5 mg/dL — ABNORMAL LOW (ref 8.9–10.3)
Chloride: 104 mmol/L (ref 98–111)
Creatinine, Ser: 0.92 mg/dL (ref 0.61–1.24)
GFR, Estimated: >60 mL/min (ref >60)
Glucose, Bld: 113 mg/dL — ABNORMAL HIGH (ref 70–99)
Potassium: 3.3 mmol/L — ABNORMAL LOW (ref 3.5–5.1)
Sodium: 137 mmol/L (ref 135–145)

## 2021-11-20 LAB — GLUCOSE, CAPILLARY
Glucose-Capillary: 113 mg/dL — ABNORMAL HIGH (ref 70–99)
Glucose-Capillary: 117 mg/dL — ABNORMAL HIGH (ref 70–99)
Glucose-Capillary: 153 mg/dL — ABNORMAL HIGH (ref 70–99)
Glucose-Capillary: 157 mg/dL — ABNORMAL HIGH (ref 70–99)
Glucose-Capillary: 167 mg/dL — ABNORMAL HIGH (ref 70–99)

## 2021-11-20 LAB — CYTOLOGY - NON PAP

## 2021-11-20 MED ORDER — POTASSIUM CHLORIDE CRYS ER 20 MEQ PO TBCR
40.0000 meq | EXTENDED_RELEASE_TABLET | Freq: Two times a day (BID) | ORAL | Status: AC
  Administered 2021-11-20 (×2): 40 meq via ORAL
  Filled 2021-11-20 (×2): qty 2

## 2021-11-20 MED ORDER — FUROSEMIDE 40 MG PO TABS
40.0000 mg | ORAL_TABLET | Freq: Two times a day (BID) | ORAL | Status: DC
  Administered 2021-11-20 – 2021-11-22 (×4): 40 mg via ORAL
  Filled 2021-11-20 (×4): qty 1

## 2021-11-20 NOTE — Progress Notes (Incomplete)
TCTS DAILY ICU PROGRESS NOTE                   [address]            [place] [address]          [phone]   Total Length of Stay:  LOS: 5 days  POD#4 - Robotic assisted left video thoracoscopy -Emergency thoracotomy -Left upper lobectomy - Mediastinal lymph node sampling - Intercostal nerve block - Rib plating x2 Subjective: Awake and alert, sitting up eating breakfast. Says he feels about the same since the chest tube was removed yesterday. Compoains of pain along the left chest incision.   Objective: Vital signs in last 24 hours: Temp:  [97.7 F (36.5 C)-98.8 F (37.1 C)] 98.3 F (36.8 C) (12/06 0454) Pulse Rate:  [80-116] 80 (12/06 0454) Cardiac Rhythm: Normal sinus rhythm (12/05 1900) Resp:  [13-28] 15 (12/06 0454) BP: (140-167)/(72-112) 167/95 (12/06 0454) SpO2:  [93 %-100 %] 95 % (12/06 0454) Weight:  [91.1 kg] 91.1 kg (12/06 0645)  Filed Weights   11/16/21 0500 11/19/21 0500 11/20/21 0645  Weight: 92.5 kg 101.4 kg 91.1 kg    Weight change: -10.3 kg   Hemodynamic parameters for last 24 hours:    Intake/Output from previous day: 12/05 0701 - 12/06 0700 In: 665.5 [P.O.:480; I.V.:185.5] Out: 1110 [Urine:1100; Chest Tube:10]  Intake/Output this shift: No intake/output data recorded.  Current Meds: Scheduled Meds:  acetaminophen  1,000 mg Oral Q6H   Or   acetaminophen (TYLENOL) oral liquid 160 mg/5 mL  1,000 mg Oral Q6H   bisacodyl  10 mg Oral Daily   Chlorhexidine Gluconate Cloth  6 each Topical Daily   enoxaparin (LOVENOX) injection  40 mg Subcutaneous Q24H   gabapentin  300 mg Oral BID   insulin aspart  0-24 Units Subcutaneous Q4H   midodrine  5 mg Oral TID WC   polyethylene glycol  17 g Per Tube Daily   potassium chloride  40 mEq Oral BID   senna-docusate  1 tablet Oral QHS   sodium chloride flush  3 mL Intravenous Q12H   Continuous Infusions:  sodium chloride 50 mL/hr at 11/18/21 1000   sodium chloride     PRN  Meds:.sodium chloride, HYDROmorphone (DILAUDID) injection, ondansetron (ZOFRAN) IV, sodium chloride flush, traMADol  General appearance: alert, cooperative, and no distress Heart: regular rate and rhythm Lungs: clear, diminished on the left.  Abdomen: benign Extremities: no edema Wound: left chest dressing is dry, expected bruising left lateral chest  Lab Results: CBC: Recent Labs    11/18/21 0439 11/19/21 0458  WBC 8.8 8.3  HGB 7.0* 8.6*  HCT 21.6* 25.9*  PLT 87* 125*    BMET:  Recent Labs    11/19/21 0458 11/20/21 0250  NA 137 137  K 3.5 3.3*  CL 107 104  CO2 24 24  GLUCOSE 127* 113*  BUN 12 12  CREATININE 1.01 0.92  CALCIUM 8.3* 8.5*     CMET: Lab Results  Component Value Date   WBC 8.3 11/19/2021   HGB 8.6 (L) 11/19/2021   HCT 25.9 (L) 11/19/2021   PLT 125 (L) 11/19/2021   GLUCOSE 113 (H) 11/20/2021   ALT 24 11/17/2021   AST 40 11/17/2021   NA 137 11/20/2021   K 3.3 (L) 11/20/2021   CL 104 11/20/2021   CREATININE 0.92 11/20/2021   BUN 12 11/20/2021   CO2 24 11/20/2021   INR 0.9 11/13/2021  PT/INR: No results for input(s): LABPROT, INR in the last 72 hours. Radiology: DG Chest 1 View  Result Date: 11/19/2021 CLINICAL DATA:  Status post left thoracotomy EXAM: CHEST  1 VIEW COMPARISON:  Previous studies including the examination done earlier today FINDINGS: Transverse diameter of heart is slightly increased. Thoracic aorta is tortuous. There are linear densities in the left parahilar region and both lower lung fields. There are no signs of alveolar pulmonary edema. Left chest tube is noted. There is previous internal fixation in the 2 of the left ribs. There is interval decrease in subcutaneous emphysema. There is no pneumothorax. There is minimal blunting of left lateral CP angle. IMPRESSION: There is no pneumothorax. Increased markings in the left mid and both lower lung fields suggest subsegmental atelectasis. Electronically Signed   By: Elmer Picker M.D.   On: 11/19/2021 13:24     Assessment/Plan:  -POD#5 left thoracotomy and upper lobectomy. CT removed yesterday.  Has new left air-fluid level on CXR this morning but this does not appear to have altered his resp status. Will monitor oxygenation with ambulation today to determine if he needs home O2 at least temporarily.  Continue PT for mobility.    -Orthostatic hypotension- present pre- and post-op. BP this AM 150's-160's.  Will stop the Midodrine and monitor.   -Volume excess- Wt not accurate with change of 10kg from yesterday. Expect he is still at least a few KG above pre-op, sill diurese further since BP has improved.   -Hypokalemia- supplement today. BMP in AM.   -Expected acute blood loss anemia- transfused with appropriate response. Recheck Hct in AM.   -ENDO- Type 2 DM.  Glucose controlled, not requiring SSI coverage.   -DVT PPX- on daily enoxaparin, ambulate.     Antony Odea PA-C Pager [phone] 11/20/2021 7:31 AM

## 2021-11-20 NOTE — Progress Notes (Signed)
     [address]       [place] [address]             [phone]       No events CXR reviewed.  Air fluid level On minimal O2 Continue PT Dispo planning  Rushil Kimbrell O Allysha Tryon

## 2021-11-21 ENCOUNTER — Inpatient Hospital Stay (HOSPITAL_COMMUNITY): Payer: Medicare Other

## 2021-11-21 LAB — GLUCOSE, CAPILLARY
Glucose-Capillary: 114 mg/dL — ABNORMAL HIGH (ref 70–99)
Glucose-Capillary: 125 mg/dL — ABNORMAL HIGH (ref 70–99)
Glucose-Capillary: 156 mg/dL — ABNORMAL HIGH (ref 70–99)
Glucose-Capillary: 166 mg/dL — ABNORMAL HIGH (ref 70–99)
Glucose-Capillary: 181 mg/dL — ABNORMAL HIGH (ref 70–99)
Glucose-Capillary: 91 mg/dL (ref 70–99)

## 2021-11-21 LAB — BASIC METABOLIC PANEL
Anion gap: 11 (ref 5–15)
BUN: 14 mg/dL (ref 8–23)
CO2: 25 mmol/L (ref 22–32)
Calcium: 8.7 mg/dL — ABNORMAL LOW (ref 8.9–10.3)
Chloride: 101 mmol/L (ref 98–111)
Creatinine, Ser: 0.95 mg/dL (ref 0.61–1.24)
GFR, Estimated: >60 mL/min (ref >60)
Glucose, Bld: 129 mg/dL — ABNORMAL HIGH (ref 70–99)
Potassium: 3.8 mmol/L (ref 3.5–5.1)
Sodium: 137 mmol/L (ref 135–145)

## 2021-11-21 LAB — CBC
HCT: 29 % — ABNORMAL LOW (ref 39.0–52.0)
Hemoglobin: 9.6 g/dL — ABNORMAL LOW (ref 13.0–17.0)
MCH: 29.7 pg (ref 26.0–34.0)
MCHC: 33.1 g/dL (ref 30.0–36.0)
MCV: 89.8 fL (ref 80.0–100.0)
Platelets: 205 10*3/uL (ref 150–400)
RBC: 3.23 MIL/uL — ABNORMAL LOW (ref 4.22–5.81)
RDW: 14.9 % (ref 11.5–15.5)
WBC: 8.6 10*3/uL (ref 4.0–10.5)
nRBC: 0 % (ref 0.0–0.2)

## 2021-11-21 MED ORDER — ALBUTEROL SULFATE (2.5 MG/3ML) 0.083% IN NEBU
2.5000 mg | INHALATION_SOLUTION | Freq: Four times a day (QID) | RESPIRATORY_TRACT | Status: DC | PRN
  Administered 2021-11-21 – 2021-11-22 (×3): 2.5 mg via RESPIRATORY_TRACT
  Filled 2021-11-21 (×4): qty 3

## 2021-11-21 MED ORDER — ACETYLCYSTEINE 10 % IN SOLN
4.0000 mL | Freq: Three times a day (TID) | RESPIRATORY_TRACT | Status: DC
  Filled 2021-11-21 (×2): qty 4

## 2021-11-21 MED ORDER — KETOROLAC TROMETHAMINE 30 MG/ML IJ SOLN
30.0000 mg | Freq: Four times a day (QID) | INTRAMUSCULAR | Status: DC
  Administered 2021-11-21: 30 mg via INTRAVENOUS
  Filled 2021-11-21: qty 1

## 2021-11-21 MED ORDER — POTASSIUM CHLORIDE CRYS ER 20 MEQ PO TBCR
20.0000 meq | EXTENDED_RELEASE_TABLET | Freq: Three times a day (TID) | ORAL | Status: DC
  Administered 2021-11-21 (×3): 20 meq via ORAL
  Filled 2021-11-21 (×3): qty 1

## 2021-11-21 MED ORDER — ACETYLCYSTEINE 20 % IN SOLN
4.0000 mL | Freq: Three times a day (TID) | RESPIRATORY_TRACT | Status: AC
  Administered 2021-11-21 – 2021-11-22 (×4): 4 mL via RESPIRATORY_TRACT
  Filled 2021-11-21 (×5): qty 4

## 2021-11-21 MED ORDER — KETOROLAC TROMETHAMINE 30 MG/ML IJ SOLN
15.0000 mg | Freq: Four times a day (QID) | INTRAMUSCULAR | Status: DC
  Administered 2021-11-21 – 2021-11-22 (×3): 15 mg via INTRAVENOUS
  Filled 2021-11-21 (×3): qty 1

## 2021-11-21 MED ORDER — GUAIFENESIN ER 600 MG PO TB12
1200.0000 mg | ORAL_TABLET | Freq: Two times a day (BID) | ORAL | Status: DC
  Administered 2021-11-21 – 2021-11-23 (×5): 1200 mg via ORAL
  Filled 2021-11-21 (×5): qty 2

## 2021-11-21 NOTE — Progress Notes (Signed)
TCTS DAILY ICU PROGRESS NOTE                   [address]            [place] [address]          [phone]   Total Length of Stay:  LOS: 6 days  POD#4 - Robotic assisted left video thoracoscopy -Emergency thoracotomy -Left upper lobectomy - Mediastinal lymph node sampling - Intercostal nerve block - Rib plating x2 Subjective: Awake and alert, sitting up eating breakfast. Says he feels about the same since the chest tube was removed yesterday. Compoains of pain along the left chest incision.   Objective: Vital signs in last 24 hours: Temp:  [97.9 F (36.6 C)-98.4 F (36.9 C)] 98.4 F (36.9 C) (12/07 0341) Pulse Rate:  [77-98] 86 (12/07 0341) Cardiac Rhythm: Sinus tachycardia (12/07 0713) Resp:  [16-20] 16 (12/07 0341) BP: (152-157)/(81-101) 156/101 (12/07 0341) SpO2:  [97 %-98 %] 98 % (12/07 0341) Weight:  [87.8 kg] 87.8 kg (12/07 0633)  Filed Weights   11/19/21 0500 11/20/21 0645 11/21/21 5329  Weight: 101.4 kg 91.1 kg 87.8 kg    Weight change: -3.3 kg   Hemodynamic parameters for last 24 hours:    Intake/Output from previous day: 12/06 0701 - 12/07 0700 In: 1803 [P.O.:1800; I.V.:3] Out: 3500 [Urine:3500]  Intake/Output this shift: No intake/output data recorded.  Current Meds: Scheduled Meds:  bisacodyl  10 mg Oral Daily   Chlorhexidine Gluconate Cloth  6 each Topical Daily   enoxaparin (LOVENOX) injection  40 mg Subcutaneous Q24H   furosemide  40 mg Oral BID   gabapentin  300 mg Oral BID   insulin aspart  0-24 Units Subcutaneous Q4H   midodrine  5 mg Oral TID WC   polyethylene glycol  17 g Per Tube Daily   senna-docusate  1 tablet Oral QHS   sodium chloride flush  3 mL Intravenous Q12H   Continuous Infusions:  sodium chloride 50 mL/hr at 11/18/21 1000   sodium chloride     PRN Meds:.sodium chloride, HYDROmorphone (DILAUDID) injection, ondansetron (ZOFRAN) IV, sodium chloride flush, traMADol  General appearance: alert,  cooperative, and no distress Heart: regular rate and rhythm Lungs: clear, diminished on the left.  Abdomen: benign Extremities: no edema Wound: left chest dressing is dry, expected bruising left lateral chest  Lab Results: CBC: Recent Labs    11/19/21 0458 11/21/21 0355  WBC 8.3 8.6  HGB 8.6* 9.6*  HCT 25.9* 29.0*  PLT 125* 205    BMET:  Recent Labs    11/20/21 0250 11/21/21 0355  NA 137 137  K 3.3* 3.8  CL 104 101  CO2 24 25  GLUCOSE 113* 129*  BUN 12 14  CREATININE 0.92 0.95  CALCIUM 8.5* 8.7*     CMET: Lab Results  Component Value Date   WBC 8.6 11/21/2021   HGB 9.6 (L) 11/21/2021   HCT 29.0 (L) 11/21/2021   PLT 205 11/21/2021   GLUCOSE 129 (H) 11/21/2021   ALT 24 11/17/2021   AST 40 11/17/2021   NA 137 11/21/2021   K 3.8 11/21/2021   CL 101 11/21/2021   CREATININE 0.95 11/21/2021   BUN 14 11/21/2021   CO2 25 11/21/2021   INR 0.9 11/13/2021      PT/INR: No results for input(s): LABPROT, INR in the last 72 hours. Radiology: No results found.   Assessment/Plan:  -POD#5 left thoracotomy and upper lobectomy. CT removed yesterday.  Has new left air-fluid level on CXR this morning but this does not appear to have altered his resp status. Will monitor oxygenation with ambulation today to determine if he needs home O2 at least temporarily.  Continue PT for mobility.    -Orthostatic hypotension- present pre- and post-op. BP this AM 150's-160's.  Will stop the Midodrine and monitor.   -Volume excess- Wt not accurate with change of 10kg from yesterday. Expect he is still at least a few KG above pre-op, sill diurese further since BP has improved.   -Hypokalemia- supplement today. BMP in AM.   -Expected acute blood loss anemia- transfused with appropriate response. Recheck Hct in AM.   -ENDO- Type 2 DM.  Glucose controlled, not requiring SSI coverage.   -DVT PPX- on daily enoxaparin, ambulate.     Antony Odea PA-C Pager [phone] 11/21/2021 7:30 AM

## 2021-11-21 NOTE — Progress Notes (Signed)
Mobility Specialist Progress Note    11/21/21 1715  Mobility  Activity Refused mobility   Pt stated he just walked within the last half hour. Pt agreed I could stop by to f/u tomorrow.   [facility] Mobility Specialist  M.S. Primary Phone: [phone] M.S. Secondary Phone: [phone]

## 2021-11-21 NOTE — Progress Notes (Addendum)
TCTS DAILY ICU PROGRESS NOTE                   [address]            [place] [address]          [phone]   Total Length of Stay:  LOS: 6 days  POD#6 - Robotic assisted left video thoracoscopy -Emergency thoracotomy -Left upper lobectomy - Mediastinal lymph node sampling - Intercostal nerve block - Rib plating x2 Subjective: Awake and alert, sitting up in bed. Says he has a productive cough, gets short of breath and fatigues easily. Complains of pain along the left chest incision.   O2 at 1Lnc with adequate sats. 3.5L urine output yesterday.   Objective: Vital signs in last 24 hours: Temp:  [97.9 F (36.6 C)-98.4 F (36.9 C)] 98.4 F (36.9 C) (12/07 0341) Pulse Rate:  [77-98] 86 (12/07 0341) Cardiac Rhythm: Sinus tachycardia (12/07 0713) Resp:  [16-20] 16 (12/07 0341) BP: (152-157)/(81-101) 156/101 (12/07 0341) SpO2:  [97 %-98 %] 98 % (12/07 0341) Weight:  [87.8 kg] 87.8 kg (12/07 0633)  Filed Weights   11/19/21 0500 11/20/21 0645 11/21/21 9977  Weight: 101.4 kg 91.1 kg 87.8 kg    Weight change: -3.3 kg      Intake/Output from previous day: 12/06 0701 - 12/07 0700 In: 1803 [P.O.:1800; I.V.:3] Out: 3500 [Urine:3500]  Intake/Output this shift: No intake/output data recorded.  Current Meds: Scheduled Meds:  bisacodyl  10 mg Oral Daily   Chlorhexidine Gluconate Cloth  6 each Topical Daily   enoxaparin (LOVENOX) injection  40 mg Subcutaneous Q24H   furosemide  40 mg Oral BID   gabapentin  300 mg Oral BID   insulin aspart  0-24 Units Subcutaneous Q4H   midodrine  5 mg Oral TID WC   polyethylene glycol  17 g Per Tube Daily   senna-docusate  1 tablet Oral QHS   sodium chloride flush  3 mL Intravenous Q12H   Continuous Infusions:  sodium chloride 50 mL/hr at 11/18/21 1000   sodium chloride     PRN Meds:.sodium chloride, HYDROmorphone (DILAUDID) injection, ondansetron (ZOFRAN) IV, sodium chloride flush, traMADol  General appearance:  alert, cooperative, and no distress Heart: S. Tach with rates 110-140's. Lungs: clear on the right, minimal air movement on the left.  Abdomen: benign Extremities: no edema Wound: left chest dressing is dry, expected bruising left lateral chest  Lab Results: CBC: Recent Labs    11/19/21 0458 11/21/21 0355  WBC 8.3 8.6  HGB 8.6* 9.6*  HCT 25.9* 29.0*  PLT 125* 205    BMET:  Recent Labs    11/20/21 0250 11/21/21 0355  NA 137 137  K 3.3* 3.8  CL 104 101  CO2 24 25  GLUCOSE 113* 129*  BUN 12 14  CREATININE 0.92 0.95  CALCIUM 8.5* 8.7*     CMET: Lab Results  Component Value Date   WBC 8.6 11/21/2021   HGB 9.6 (L) 11/21/2021   HCT 29.0 (L) 11/21/2021   PLT 205 11/21/2021   GLUCOSE 129 (H) 11/21/2021   ALT 24 11/17/2021   AST 40 11/17/2021   NA 137 11/21/2021   K 3.8 11/21/2021   CL 101 11/21/2021   CREATININE 0.95 11/21/2021   BUN 14 11/21/2021   CO2 25 11/21/2021   INR 0.9 11/13/2021      PT/INR: No results for input(s): LABPROT, INR in the last 72 hours. Radiology: No results found.  Assessment/Plan:  -POD#6 left thoracotomy and upper lobectomy. CT out x 48 hours.  Worsening ATX / consolidation on the left. Needs aggressive pulmonary hygiene.  Add flutter valve Continue PT for mobility.    -Orthostatic hypotension- present pre- and post-op. BP this AM 150's-160's.  Will stop the Midodrine and monitor.   -Volume excess- good response to oral Lasix yesterday, Wt still several Kg+.  Continue Lasix.  -Hypokalemia- corrected.  Continue Kdur 33mEq TID.  BMP in AM.   -Expected acute blood loss anemia- transfused with appropriate response.  Hct trending up.  -ENDO- Type 2 DM.  Glucose controlled, not requiring SSI coverage.   -DVT PPX- on daily enoxaparin, ambulate.     Antony Odea PA-C Pager [phone] 11/21/2021 7:31 AM   Agree with above Continue pulm hygiene Added toradol for pain control Dispo planning.  Likely home on  Friday.  Kalonji Zurawski Bary Leriche

## 2021-11-21 NOTE — TOC Progression Note (Signed)
Transition of Care [facility]) - Progression Note    Patient Details  Name: [name] MRN: [id number] Date of Birth: 12/19/1950  Transition of Care [facility]) CM/SW Contact  Zenon Mayo, RN Phone Number: 11/21/2021, 9:30 PM  Clinical Narrative:    POD 6 lobectomy and thoracotomy ,cxr worse with atx , weaning oxygen, has received a lot of blood transusions, received dilaudid iv  and toradol iv prn today.  Toc will continue to follow for dc needs.        Expected Discharge Plan and Services                                                 Social Determinants of Health (SDOH) Interventions    Readmission Risk Interventions No flowsheet data found.

## 2021-11-21 NOTE — Care Management Important Message (Signed)
Important Message  Patient Details  Name: TORREZ RENFROE MRN: [id number] Date of Birth: 1951/04/07   Medicare Important Message Given:  Yes     Bernerd Terhune 11/21/2021, 10:30 AM

## 2021-11-21 NOTE — Progress Notes (Signed)
Physical Therapy Treatment Patient Details Name: [name] MRN: [id number] DOB: 1951-11-04 Today's Date: 11/21/2021   History of Present Illness The pt is a 70 yo male presenting 12/1 for bronchoscopy due to finding of a mass in the L upper lobe. The pt was immediately taken for L upper lobectomy whih required thoacotomy of the same region. Pt with significant blood loss during surgery requiring 8 units PRBC, possibly 5 units FFP, resuscitation in OR and intubation, extuabted later the same day 12/1. PMH includes: DM II and HTN.    PT Comments    Patient progressing with ambulation distance and tolerance to activity.  HR up to 150's so did not attempt stairs.  Patient encouraged to walk slower to avoid HR elevation and for energy conservation.  Seems may be appropriate for outpatient pulmonary rehab.  No follow up PT recommended.  Will continue to follow acutely and address stairs when HR improved.    Recommendations for follow up therapy are one component of a multi-disciplinary discharge planning process, led by the attending physician.  Recommendations may be updated based on patient status, additional functional criteria and insurance authorization.  Follow Up Recommendations  No PT follow up (could consider outpatient pulmonary rehab)     Assistance Recommended at Discharge Intermittent Supervision/Assistance  Equipment Recommendations  Rollator (4 wheels)    Recommendations for Other Services       Precautions / Restrictions Precautions Precautions: Fall Precaution Comments: watch SpO2 & HR     Mobility  Bed Mobility Overal bed mobility: Modified Independent                  Transfers   Equipment used: Rollator (4 wheels) Transfers: Sit to/from Stand Sit to Stand: Modified independent (Device/Increase time)                Ambulation/Gait Ambulation/Gait assistance: Supervision;Min guard Gait Distance (Feet): 200 Feet (x 2 & 100' x  2) Assistive device: Rollator (4 wheels) Gait Pattern/deviations: Step-through pattern       General Gait Details: walked a bit then rested due to HR up to 148 and educated how to lock brakes, back up to the wall and sit.  Ambulated rest of unit, then again back to sit due to HR up 152.  on 3L O2 throughout   Stairs             Wheelchair Mobility    Modified Rankin (Stroke Patients Only)       Balance Overall balance assessment: Needs assistance   Sitting balance-Leahy Scale: Good       Standing balance-Leahy Scale: Good Standing balance comment: can stand without UE support and turn to retrieve cup from his b/s table                            Cognition Arousal/Alertness: Awake/alert Behavior During Therapy: [facility] for tasks assessed/performed;Impulsive Overall Cognitive Status: Within Functional Limits for tasks assessed                                          Exercises      General Comments General comments (skin integrity, edema, etc.): HR max 152, SpO2 reading 90's when not using hand on walker      Pertinent Vitals/Pain Faces Pain Scale: Hurts little more Pain Location: incision sites Pain Descriptors / Indicators:  Discomfort Pain Intervention(s): Monitored during session    Home Living                          Prior Function            PT Goals (current goals can now be found in the care plan section) Progress towards PT goals: Progressing toward goals    Frequency    Min 3X/week      PT Plan Discharge plan needs to be updated    Co-evaluation              AM-PAC PT "6 Clicks" Mobility   Outcome Measure  Help needed turning from your back to your side while in a flat bed without using bedrails?: None Help needed moving from lying on your back to sitting on the side of a flat bed without using bedrails?: None Help needed moving to and from a bed to a chair (including a wheelchair)?: A  Little Help needed standing up from a chair using your arms (e.g., wheelchair or bedside chair)?: None Help needed to walk in hospital room?: A Little Help needed climbing 3-5 steps with a railing? : A Little 6 Click Score: 21    End of Session Equipment Utilized During Treatment: Oxygen Activity Tolerance: Patient tolerated treatment well Patient left: in bed;with call bell/phone within reach   PT Visit Diagnosis: Other abnormalities of gait and mobility (R26.89);Unsteadiness on feet (R26.81);Muscle weakness (generalized) (M62.81)     Time: 6333-5456 PT Time Calculation (min) (ACUTE ONLY): 29 min  Charges:  $Gait Training: 23-37 mins                     Magda Kiel, PT Acute Rehabilitation Services [phone] [phone] 11/21/2021    Reginia Naas 11/21/2021, 5:19 PM

## 2021-11-22 ENCOUNTER — Inpatient Hospital Stay (HOSPITAL_COMMUNITY): Payer: Medicare Other

## 2021-11-22 LAB — GLUCOSE, CAPILLARY
Glucose-Capillary: 98 mg/dL (ref 70–99)
Glucose-Capillary: 171 mg/dL — ABNORMAL HIGH (ref 70–99)
Glucose-Capillary: 102 mg/dL — ABNORMAL HIGH (ref 70–99)
Glucose-Capillary: 157 mg/dL — ABNORMAL HIGH (ref 70–99)
Glucose-Capillary: 121 mg/dL — ABNORMAL HIGH (ref 70–99)
Glucose-Capillary: 116 mg/dL — ABNORMAL HIGH (ref 70–99)

## 2021-11-22 LAB — BASIC METABOLIC PANEL
Anion gap: 8 (ref 5–15)
BUN: 26 mg/dL — ABNORMAL HIGH (ref 8–23)
CO2: 26 mmol/L (ref 22–32)
Calcium: 8.6 mg/dL — ABNORMAL LOW (ref 8.9–10.3)
Chloride: 100 mmol/L (ref 98–111)
Creatinine, Ser: 1.39 mg/dL — ABNORMAL HIGH (ref 0.61–1.24)
GFR, Estimated: 55 mL/min — ABNORMAL LOW (ref >60)
Glucose, Bld: 112 mg/dL — ABNORMAL HIGH (ref 70–99)
Potassium: 3.8 mmol/L (ref 3.5–5.1)
Sodium: 134 mmol/L — ABNORMAL LOW (ref 135–145)

## 2021-11-22 MED ORDER — POTASSIUM CHLORIDE CRYS ER 20 MEQ PO TBCR
20.0000 meq | EXTENDED_RELEASE_TABLET | Freq: Three times a day (TID) | ORAL | Status: AC
  Administered 2021-11-22 (×3): 20 meq via ORAL
  Filled 2021-11-22 (×3): qty 1

## 2021-11-22 MED ORDER — ALBUTEROL SULFATE (2.5 MG/3ML) 0.083% IN NEBU
INHALATION_SOLUTION | RESPIRATORY_TRACT | Status: AC
  Filled 2021-11-22: qty 3

## 2021-11-22 NOTE — Plan of Care (Signed)

## 2021-11-22 NOTE — Progress Notes (Signed)
Rude talking to staff at times.

## 2021-11-22 NOTE — Progress Notes (Addendum)
TCTS DAILY ICU PROGRESS NOTE                   [address]            [place] [address]          [phone]   Total Length of Stay:  LOS: 7 days  POD#7 - Robotic assisted left video thoracoscopy -Emergency thoracotomy -Left upper lobectomy - Mediastinal lymph node sampling - Intercostal nerve block - Rib plating x2  Subjective: Feels better today.  Says he produced a lot of mucus after mucomyst treatments  x 2 yesterday.  O2 a 2L/[place].   Objective: Vital signs in last 24 hours: Temp:  [97.6 F (36.4 C)-98 F (36.7 C)] 97.8 F (36.6 C) (12/08 0734) Pulse Rate:  [76-96] 96 (12/08 0734) Cardiac Rhythm: Sinus tachycardia (12/07 1901) Resp:  [16-19] 18 (12/08 0734) BP: (140-157)/(80-97) 157/80 (12/08 0734) SpO2:  [95 %-99 %] 95 % (12/08 0734)  Filed Weights   11/19/21 0500 11/20/21 0645 11/21/21 9675  Weight: 101.4 kg 91.1 kg 87.8 kg    Weight change:       Intake/Output from previous day: 12/07 0701 - 12/08 0700 In: 623 [P.O.:530; I.V.:93] Out: 2025 [Urine:2025]  Intake/Output this shift: No intake/output data recorded.  Current Meds: Scheduled Meds:  acetylcysteine  4 mL Nebulization TID   bisacodyl  10 mg Oral Daily   Chlorhexidine Gluconate Cloth  6 each Topical Daily   enoxaparin (LOVENOX) injection  40 mg Subcutaneous Q24H   furosemide  40 mg Oral BID   gabapentin  300 mg Oral BID   guaiFENesin  1,200 mg Oral BID   insulin aspart  0-24 Units Subcutaneous Q4H   ketorolac  15 mg Intravenous Q6H   polyethylene glycol  17 g Per Tube Daily   potassium chloride  20 mEq Oral TID   senna-docusate  1 tablet Oral QHS   sodium chloride flush  3 mL Intravenous Q12H   Continuous Infusions:  sodium chloride 50 mL/hr at 11/18/21 1000   sodium chloride     PRN Meds:.sodium chloride, albuterol, HYDROmorphone (DILAUDID) injection, ondansetron (ZOFRAN) IV, sodium chloride flush, traMADol  General appearance: alert, cooperative, and no  distress Heart: S. Tach with rates down to 90-110. Lungs: clear on the right, better aeration on th left Abdomen: benign Extremities: no edema Wound: left chest dressing is dry, expected bruising left lateral chest  Lab Results: CBC: Recent Labs    11/21/21 0355  WBC 8.6  HGB 9.6*  HCT 29.0*  PLT 205    BMET:  Recent Labs    11/21/21 0355 11/22/21 0412  NA 137 134*  K 3.8 3.8  CL 101 100  CO2 25 26  GLUCOSE 129* 112*  BUN 14 26*  CREATININE 0.95 1.39*  CALCIUM 8.7* 8.6*     CMET: Lab Results  Component Value Date   WBC 8.6 11/21/2021   HGB 9.6 (L) 11/21/2021   HCT 29.0 (L) 11/21/2021   PLT 205 11/21/2021   GLUCOSE 112 (H) 11/22/2021   ALT 24 11/17/2021   AST 40 11/17/2021   NA 134 (L) 11/22/2021   K 3.8 11/22/2021   CL 100 11/22/2021   CREATININE 1.39 (H) 11/22/2021   BUN 26 (H) 11/22/2021   CO2 26 11/22/2021   INR 0.9 11/13/2021      PT/INR: No results for input(s): LABPROT, INR in the last 72 hours. Radiology: No results found.   Assessment/Plan:  -POD#7 left  thoracotomy and upper lobectomy. CXR improving with slightly better aeration on the left  Needs to continue aggressive pulmonary hygiene.   Continue PT for mobility.    -Orthostatic hypotension- present pre- and post-op. BP this AM 1140-150. Off Midodrine.   -Volume excess- good response to oral Lasix past 2 days, no new Wt today. Creat up 0.95-> 1.39 over past 24 hours.  Will d/c the Lasix and toradol, monitor renal function.   -Hypokalemia- corrected.  Continue Kdur 77mEq TID x 24 hours then stop. BMP in AM  -Expected acute blood loss anemia- transfused with appropriate response.  Hct trending up.  -ENDO- Type 2 DM.  Glucose controlled, not requiring SSI coverage.   -DVT PPX- on daily enoxaparin, ambulate.    -Anticipate discharge home tomorrow with home temporary home O2 and a walker   Antony Odea PA-C Pager [phone] 11/22/2021 7:38 AM

## 2021-11-22 NOTE — Progress Notes (Signed)
SATURATION QUALIFICATIONS: (This note is used to comply with regulatory documentation for home oxygen)  Patient Saturations on Room Air at Rest = 96%  Patient Saturations on Room Air while Ambulating = 95%  

## 2021-11-22 NOTE — Plan of Care (Signed)
  Problem: Education: Goal: Knowledge of General Education information will improve Description: Including pain rating scale, medication(s)/side effects and non-pharmacologic comfort measures Outcome: Progressing   Problem: Health Behavior/Discharge Planning: Goal: Ability to manage health-related needs will improve Outcome: Progressing   Problem: Clinical Measurements: Goal: Ability to maintain clinical measurements within normal limits will improve Outcome: Progressing Goal: Will remain free from infection Outcome: Progressing Goal: Respiratory complications will improve Outcome: Progressing   Problem: Activity: Goal: Risk for activity intolerance will decrease Outcome: Progressing   Problem: Coping: Goal: Level of anxiety will decrease Outcome: Progressing   Problem: Elimination: Goal: Will not experience complications related to urinary retention Outcome: Progressing   Problem: Pain Managment: Goal: General experience of comfort will improve Outcome: Progressing   Problem: Safety: Goal: Ability to remain free from injury will improve Outcome: Progressing

## 2021-11-22 NOTE — Discharge Instructions (Signed)

## 2021-11-22 NOTE — Progress Notes (Signed)
SATURATION QUALIFICATIONS: (This note is used to comply with regulatory documentation for home oxygen)  Patient Saturations on Room Air at Rest = 95%  Patient Saturations on Room Air while Ambulating = 95%  Patient Saturations on 0 Liters of oxygen while Ambulating = 95%  Please briefly explain why patient needs home oxygen:no oxygen needed on discharge but requested for  home o2 when needed.

## 2021-11-22 NOTE — Progress Notes (Signed)
Mobility Specialist Progress Note    11/22/21 1115  Mobility  Activity Ambulated in hall  Level of Assistance Standby assist, set-up cues, supervision of patient - no hands on  Assistive Device Four wheel walker  Distance Ambulated (ft) 340 ft  Mobility Ambulated with assistance in hallway  Mobility Response Tolerated well  Mobility performed by Mobility specialist  $Mobility charge 1 Mobility   Pt received in bed and agreeable. Ambulated on RA and SpO2 stayed in mid 90s. Returned to sitting EOB with family present.   Pt requesting to take oxygen home for comfort and a rollator, CM notified.   [facility] Mobility Specialist  M.S. Primary Phone: [phone] M.S. Secondary Phone: [phone]

## 2021-11-23 ENCOUNTER — Inpatient Hospital Stay (HOSPITAL_COMMUNITY): Payer: Medicare Other

## 2021-11-23 LAB — CBC
HCT: 27.4 % — ABNORMAL LOW (ref 39.0–52.0)
Hemoglobin: 9.1 g/dL — ABNORMAL LOW (ref 13.0–17.0)
MCH: 30.3 pg (ref 26.0–34.0)
MCHC: 33.2 g/dL (ref 30.0–36.0)
MCV: 91.3 fL (ref 80.0–100.0)
Platelets: 259 10*3/uL (ref 150–400)
RBC: 3 MIL/uL — ABNORMAL LOW (ref 4.22–5.81)
RDW: 14.6 % (ref 11.5–15.5)
WBC: 8.1 10*3/uL (ref 4.0–10.5)
nRBC: 0 % (ref 0.0–0.2)

## 2021-11-23 LAB — GLUCOSE, CAPILLARY
Glucose-Capillary: 115 mg/dL — ABNORMAL HIGH (ref 70–99)
Glucose-Capillary: 144 mg/dL — ABNORMAL HIGH (ref 70–99)

## 2021-11-23 LAB — BASIC METABOLIC PANEL
Anion gap: 10 (ref 5–15)
BUN: 24 mg/dL — ABNORMAL HIGH (ref 8–23)
CO2: 25 mmol/L (ref 22–32)
Calcium: 8.5 mg/dL — ABNORMAL LOW (ref 8.9–10.3)
Chloride: 103 mmol/L (ref 98–111)
Creatinine, Ser: 1.13 mg/dL (ref 0.61–1.24)
GFR, Estimated: >60 mL/min (ref >60)
Glucose, Bld: 118 mg/dL — ABNORMAL HIGH (ref 70–99)
Potassium: 4 mmol/L (ref 3.5–5.1)
Sodium: 138 mmol/L (ref 135–145)

## 2021-11-23 MED ORDER — GABAPENTIN 50 MG PO TABS: 300.0000 mg | ORAL_TABLET | Freq: Two times a day (BID) | ORAL | 1 refills | Status: DC

## 2021-11-23 MED ORDER — GABAPENTIN 300 MG PO CAPS: 300.0000 mg | ORAL_CAPSULE | Freq: Three times a day (TID) | ORAL | 2 refills | Status: DC

## 2021-11-23 MED ORDER — GUAIFENESIN ER 600 MG PO TB12: 1200.0000 mg | ORAL_TABLET | Freq: Two times a day (BID) | ORAL | Status: DC

## 2021-11-23 MED ORDER — TRAMADOL HCL 50 MG PO TABS: 50.0000 mg | ORAL_TABLET | Freq: Four times a day (QID) | ORAL | 0 refills | Status: DC | PRN

## 2021-11-23 NOTE — Progress Notes (Signed)
Discharged home accompanied by wife. Belongings taken home.

## 2021-11-23 NOTE — Progress Notes (Signed)
Central line to right IJ  removed  and tolerated well. Instructed to call for any sign of bleeding. Dressing to left post chest tube site done site clean and dry. Encouraged to be on bedrest for 30 min. Continue to monitor.

## 2021-11-23 NOTE — TOC Transition Note (Signed)
Transition of Care [facility]) - CM/SW Discharge Note   Patient Details  Name: [name] MRN: [id number] Date of Birth: 15-Nov-1951  Transition of [facility]) CM/SW Contact:  Zenon Mayo, RN Phone Number: 11/23/2021, 10:02 AM   Clinical Narrative:    NCM spoke with patient he does want a rollator, he prefers a black one, he is ok with [employer] supplying this for him.  NCM made referral to [facility] with [employer] , this will be brought to his room prior to dc.          Patient Goals and CMS Choice        Discharge Placement                       Discharge Plan and Services                                     Social Determinants of Health (SDOH) Interventions     Readmission Risk Interventions No flowsheet data found.

## 2021-11-23 NOTE — Progress Notes (Signed)
Rollator delivered, discharged instructions given to pt.all set for discharge home , awaiting ride home. Belongings with pt.

## 2021-11-23 NOTE — Progress Notes (Signed)
TCTS DAILY ICU PROGRESS NOTE                   [address]            [place] [address]          [phone]   Total Length of Stay:  LOS: 8 days  POD#8 - Robotic assisted left video thoracoscopy -Emergency thoracotomy -Left upper lobectomy - Mediastinal lymph node sampling - Intercostal nerve block - Rib plating x2  Subjective:  Feels okay.  No new concerns. Walk test was performed yesterday and he maintained oxygen saturations of 95% on room air both at rest and with activity. Pain is well controlled.   Objective: Vital signs in last 24 hours: Temp:  [97.7 F (36.5 C)-97.8 F (36.6 C)] 97.8 F (36.6 C) (12/09 0400) Pulse Rate:  [88-121] 99 (12/09 0400) Cardiac Rhythm: Normal sinus rhythm (12/09 0430) Resp:  [16-24] 18 (12/09 0400) BP: (143-164)/(85-95) 157/93 (12/09 0400) SpO2:  [93 %-99 %] 97 % (12/09 0400) Weight:  [86.7 kg] 86.7 kg (12/09 0409)  Filed Weights   11/20/21 0645 11/21/21 0633 11/23/21 0409  Weight: 91.1 kg 87.8 kg 86.7 kg    Weight change:       Intake/Output from previous day: 12/08 0701 - 12/09 0700 In: 720 [P.O.:720] Out: 1025 [Urine:1025]  Intake/Output this shift: No intake/output data recorded.  Current Meds: Scheduled Meds:  bisacodyl  10 mg Oral Daily   Chlorhexidine Gluconate Cloth  6 each Topical Daily   enoxaparin (LOVENOX) injection  40 mg Subcutaneous Q24H   gabapentin  300 mg Oral BID   guaiFENesin  1,200 mg Oral BID   insulin aspart  0-24 Units Subcutaneous Q4H   polyethylene glycol  17 g Per Tube Daily   senna-docusate  1 tablet Oral QHS   sodium chloride flush  3 mL Intravenous Q12H   Continuous Infusions:  sodium chloride 50 mL/hr at 11/18/21 1000   sodium chloride     PRN Meds:.sodium chloride, albuterol, HYDROmorphone (DILAUDID) injection, ondansetron (ZOFRAN) IV, sodium chloride flush, traMADol  General appearance: alert, cooperative, and no distress Heart: Mild sinus tachycardia  lungs:  clear on the right, diminished on the left.  Chest x-ray about the same. Abdomen: benign Extremities: no edema Wound: left chest dressing is dry, expected bruising left lateral chest  Lab Results: CBC: Recent Labs    11/21/21 0355 11/23/21 0500  WBC 8.6 8.1  HGB 9.6* 9.1*  HCT 29.0* 27.4*  PLT 205 259    BMET:  Recent Labs    11/22/21 0412 11/23/21 0500  NA 134* 138  K 3.8 4.0  CL 100 103  CO2 26 25  GLUCOSE 112* 118*  BUN 26* 24*  CREATININE 1.39* 1.13  CALCIUM 8.6* 8.5*     CMET: Lab Results  Component Value Date   WBC 8.1 11/23/2021   HGB 9.1 (L) 11/23/2021   HCT 27.4 (L) 11/23/2021   PLT 259 11/23/2021   GLUCOSE 118 (H) 11/23/2021   ALT 24 11/17/2021   AST 40 11/17/2021   NA 138 11/23/2021   K 4.0 11/23/2021   CL 103 11/23/2021   CREATININE 1.13 11/23/2021   BUN 24 (H) 11/23/2021   CO2 25 11/23/2021   INR 0.9 11/13/2021      PT/INR: No results for input(s): LABPROT, INR in the last 72 hours. Radiology: No results found.   Assessment/Plan:  -POD#8 left thoracotomy and upper lobectomy. CXR about the same today.  He is maintaining adequate oxygenation on room air.  Needs to continue aggressive pulmonary hygiene.      -Orthostatic hypotension- present pre- and post-op. Off Midodrine. Now having systolic blood pressures of 140-150.   He will resume his usual blood pressure medications at home.   -Volume excess- good response to oral Lasix past 2 days, no new Wt today. Creat  1.39-->1.13 over past 24 hours.    -Hypokalemia- corrected.  Planning no further diuresis or potassium supplement  -Expected acute blood loss anemia- transfused with appropriate response.  Hct is stable  -ENDO- Type 2 DM.  Glucose controlled, not requiring SSI coverage.    -Anticipate discharge home to today.  Arranged for a walker.  He does not qualify for home O2 therapy and is maintaining good oxygen saturation on room air.  Follow-up in the office in 1 week has been  arranged.  Antony Odea PA-C Pager [phone] 11/23/2021 7:39 AM

## 2021-11-23 NOTE — Progress Notes (Signed)
Physical Therapy Treatment and Discharge  Patient Details Name: [name] MRN: [id number] DOB: 1951/03/25 Today's Date: 11/23/2021   History of Present Illness The pt is a 70 yo male presenting 12/1 for bronchoscopy due to finding of a mass in the L upper lobe. The pt was immediately taken for L upper lobectomy whih required thoacotomy of the same region. Pt with significant blood loss during surgery requiring 8 units PRBC, possibly 5 units FFP, resuscitation in OR and intubation, extuabted later the same day 12/1. PMH includes: DM II and HTN.    PT Comments    Pt progressing was modified independent with all mobility today. Able to ambulate and complete stairs with no rest break. HR elevated to 155 with stairs, but quickly recovered. Pt has no concerns with returning home. All acute care PT goals have been met - d/c PT at this time. No PT follow up recommended.    Recommendations for follow up therapy are one component of a multi-disciplinary discharge planning process, led by the attending physician.  Recommendations may be updated based on patient status, additional functional criteria and insurance authorization.  Follow Up Recommendations  No PT follow up     Assistance Recommended at Discharge PRN  Equipment Recommendations  Rollator (4 wheels)    Recommendations for Other Services       Precautions / Restrictions Precautions Precautions: Fall Restrictions Weight Bearing Restrictions: No     Mobility  Bed Mobility Overal bed mobility: Modified Independent             General bed mobility comments: HOB elevated    Transfers Overall transfer level: Modified independent Equipment used: Rollator (4 wheels) Transfers: Sit to/from Stand Sit to Stand: Modified independent (Device/Increase time)           General transfer comment: stood with no issue from EOB with rollator    Ambulation/Gait Ambulation/Gait assistance: Modified independent  (Device/Increase time) Gait Distance (Feet): 220 Feet Assistive device: Rollator (4 wheels) Gait Pattern/deviations: Step-through pattern;WFL(Within Functional Limits) Gait velocity: functional     General Gait Details: no rest breaks needed today   Stairs Stairs: Yes Stairs assistance: Modified independent (Device/Increase time) Stair Management: One rail Right;Forwards Number of Stairs: 10 General stair comments: Pt able to ascend/descend stairs with no issues. Used rail, but not heavily reliant on it. Feels confident with stairs at home.   Wheelchair Mobility    Modified Rankin (Stroke Patients Only)       Balance Overall balance assessment: Needs assistance   Sitting balance-Leahy Scale: Good       Standing balance-Leahy Scale: Good Standing balance comment: able to static stand and turn without UE support, uses rollator for ambulation for energy conservation                            Cognition Arousal/Alertness: Awake/alert Behavior During Therapy: [facility] for tasks assessed/performed;Impulsive Overall Cognitive Status: Within Functional Limits for tasks assessed                                          Exercises      General Comments General comments (skin integrity, edema, etc.): HR in 120s with ambulation, up to 155 with stairs. Pt had no complaints and did not request a rest break. Talked through how using stairs with railings is usually a better option  for added stability. Pt had no concerns about going home.      Pertinent Vitals/Pain Pain Assessment: Faces Faces Pain Scale: Hurts a little bit Pain Location: incision sites Pain Descriptors / Indicators: Discomfort Pain Intervention(s): Monitored during session    Home Living                          Prior Function            PT Goals (current goals can now be found in the care plan section) Acute Rehab PT Goals PT Goal Formulation: All assessment and  education complete, DC therapy Potential to Achieve Goals: Good Progress towards PT goals: Goals met/education completed, patient discharged from PT    Frequency    Min 3X/week      PT Plan Current plan remains appropriate    Co-evaluation              AM-PAC PT "6 Clicks" Mobility   Outcome Measure  Help needed turning from your back to your side while in a flat bed without using bedrails?: None Help needed moving from lying on your back to sitting on the side of a flat bed without using bedrails?: None Help needed moving to and from a bed to a chair (including a wheelchair)?: A Little Help needed standing up from a chair using your arms (e.g., wheelchair or bedside chair)?: None Help needed to walk in hospital room?: None Help needed climbing 3-5 steps with a railing? : None 6 Click Score: 23    End of Session Equipment Utilized During Treatment: Gait belt Activity Tolerance: Patient tolerated treatment well Patient left: in bed;with call bell/phone within reach Nurse Communication: Mobility status PT Visit Diagnosis: Other abnormalities of gait and mobility (R26.89);Unsteadiness on feet (R26.81);Muscle weakness (generalized) (M62.81)     Time: 5701-7793 PT Time Calculation (min) (ACUTE ONLY): 14 min  Charges:  $Gait Training: 8-22 mins                     [name], SPT    [name] 11/23/2021, 12:00 PM

## 2021-11-29 ENCOUNTER — Other Ambulatory Visit: Payer: Self-pay | Admitting: Thoracic Surgery (Cardiothoracic Vascular Surgery)

## 2021-11-29 ENCOUNTER — Encounter: Payer: Self-pay | Admitting: *Deleted

## 2021-11-29 DIAGNOSIS — R911 Solitary pulmonary nodule: Secondary | ICD-10-CM

## 2021-11-29 NOTE — Progress Notes (Signed)
Oncology Nurse Navigator Documentation  Oncology Nurse Navigator Flowsheets 11/29/2021  Navigator Follow Up Date: 12/11/2021  Navigator Follow Up Reason: Appointment Review  Navigator Location [facility]  Navigator Encounter Type Pathology Review/Per Dr. Julien Nordmann, I notified pathology to send [facility] and PDL 1 on [name] on recent surgery   Patient Visit Type Other  Treatment Phase Other  Barriers/Navigation Needs Coordination of Care  Interventions Coordination of Care  Acuity Level 2-Minimal Needs (1-2 Barriers Identified)  Coordination of Care Pathology  Time Spent with Patient 30

## 2021-11-30 ENCOUNTER — Ambulatory Visit (INDEPENDENT_AMBULATORY_CARE_PROVIDER_SITE_OTHER): Payer: Self-pay | Admitting: Thoracic Surgery (Cardiothoracic Vascular Surgery)

## 2021-11-30 ENCOUNTER — Other Ambulatory Visit: Payer: Self-pay

## 2021-11-30 ENCOUNTER — Ambulatory Visit
Admission: RE | Admit: 2021-11-30 | Discharge: 2021-11-30 | Disposition: A | Discharge status: Home / Self Care | Payer: Medicare Other | Source: Ambulatory Visit | Attending: Thoracic Surgery (Cardiothoracic Vascular Surgery) | Admitting: Thoracic Surgery (Cardiothoracic Vascular Surgery)

## 2021-11-30 VITALS — BP 124/77 | HR 64 | Resp 20 | Ht 71.0 in | Wt 186.0 lb

## 2021-11-30 DIAGNOSIS — I10 Essential (primary) hypertension: Secondary | ICD-10-CM | POA: Insufficient documentation

## 2021-11-30 DIAGNOSIS — R911 Solitary pulmonary nodule: Secondary | ICD-10-CM

## 2021-11-30 DIAGNOSIS — E119 Type 2 diabetes mellitus without complications: Secondary | ICD-10-CM | POA: Insufficient documentation

## 2021-11-30 DIAGNOSIS — Z09 Encounter for follow-up examination after completed treatment for conditions other than malignant neoplasm: Secondary | ICD-10-CM

## 2021-11-30 DIAGNOSIS — Z1211 Encounter for screening for malignant neoplasm of colon: Secondary | ICD-10-CM | POA: Insufficient documentation

## 2021-11-30 MED ORDER — OXYCODONE-ACETAMINOPHEN 10-325 MG PO TABS: 1.0000 | ORAL_TABLET | ORAL | 0 refills | Status: DC | PRN

## 2021-12-03 NOTE — Progress Notes (Signed)
[address]       [place] [address]             [id number]        Jaivyn V Walker [facility] Medical Record #[id number] Date of Birth: 11-Oct-1951  Referring: Heilingoetter, Architectural technologist* Primary Care: [facility] Primary Cardiologist:None  Reason for visit:   follow-up  History of Present Illness:     70 year old male that comes in for his 1 week follow-up appointment after undergoing a left upper lobectomy.  He continues to complain of pain at the thoracotomy site.  His respiratory status has been stable.  Physical Exam: BP 124/77    Pulse 64    Resp 20    Ht 5\' 11"  (1.803 m)    Wt 186 lb (84.4 kg)    SpO2 99% Comment: RA   BMI 25.94 kg/m   Alert NAD Incision clean.  Stitch removed Abdomen soft, ND Trace peripheral edema   Diagnostic Studies & Laboratory data: CXR: Stable Path:  FINAL MICROSCOPIC DIAGNOSIS:   A. LUNG, LEFT UPPER LOBE, LOBECTOMY:  - Invasive moderately differentiated adenocarcinoma, 2.9 cm, acinar  predominant  - Tumor abuts pleura but definite visceral pleural invasion is not  identified  - Resection margins are negative for carcinoma  - Six benign lymph nodes (0/6)  - See oncology table   B. LYMPH NODE, 9, EXCISION:  - Lymph node, negative for carcinoma (0/1)   C. LYMPH NODE, 7, EXCISION:  - Lymph node, negative for carcinoma (0/1)   D. LYMPH NODE, HILAR, EXCISION:  - Lymph node, negative for carcinoma (0/1)   E. LYMPH NODE, HILAR #2, EXCISION:  - Lymph node, negative for carcinoma (0/1)   F. LYMPH NODE, 5, EXCISION:  - Lymph node, negative for carcinoma (0/1)   G. LYMPH NODE, 6, EXCISION:  - Lymph node, negative for carcinoma (0/1)   H. 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  Tumor Focality: Unifocal  Tumor Site: Upper lobe  Tumor Size: 2.9 cm  Histologic Type: Adenocarcinoma, moderately differentiated  Visceral Pleura Invasion: Not identified  Direct Invasion of Adjacent Structures: No adjacent structures present  Lymphovascular Invasion: Present  Margins: All margins negative for invasive carcinoma       Closest Margin(s) to Invasive Carcinoma: Bronchial margin       Margin(s) Involved by Invasive Carcinoma: Not applicable        Margin Status for Non-Invasive Tumor: Not applicable  Treatment Effect: No known presurgical therapy  Regional Lymph Nodes:       Number of Lymph Nodes Involved: 2                            Nodal Sites with Tumor: Level 6       Number of Lymph Nodes Examined: 14                       Nodal Sites Examined: Levels 5, 6, 7, 9, 10  Distant Metastasis:       Distant Site(s) Involved: Not  applicable  Pathologic Stage Classification (pTNM, AJCC 8th Edition): pT1c, pN2    Assessment / Plan:   70 year old male status post left upper lobectomy for a T1 cN2 M0 moderately differentiated adenocarcinoma.  Stage IIIa.  He has been referred to medical oncology to discuss plans for chemotherapy and radiation.  He will follow-up with me in 1 month with another chest x-ray.   Lajuana Matte 12/03/2021 12:45 PM

## 2021-12-04 ENCOUNTER — Encounter: Payer: Self-pay | Admitting: Internal Medicine

## 2021-12-04 ENCOUNTER — Inpatient Hospital Stay: Payer: Medicare Other | Attending: Internal Medicine | Admitting: Internal Medicine

## 2021-12-04 ENCOUNTER — Other Ambulatory Visit: Payer: Self-pay

## 2021-12-04 VITALS — BP 128/77 | HR 66 | Temp 97.3°F | Resp 20 | Ht 71.0 in | Wt 197.7 lb

## 2021-12-04 DIAGNOSIS — C3412 Malignant neoplasm of upper lobe, left bronchus or lung: Secondary | ICD-10-CM | POA: Diagnosis present

## 2021-12-04 DIAGNOSIS — C3492 Malignant neoplasm of unspecified part of left bronchus or lung: Secondary | ICD-10-CM | POA: Diagnosis not present

## 2021-12-04 DIAGNOSIS — Z5111 Encounter for antineoplastic chemotherapy: Secondary | ICD-10-CM | POA: Insufficient documentation

## 2021-12-04 DIAGNOSIS — I10 Essential (primary) hypertension: Secondary | ICD-10-CM | POA: Insufficient documentation

## 2021-12-04 DIAGNOSIS — E119 Type 2 diabetes mellitus without complications: Secondary | ICD-10-CM | POA: Insufficient documentation

## 2021-12-04 NOTE — Progress Notes (Signed)
[name] [phone]   [phone]  OFFICE PROGRESS NOTE  Associates, [facility] [place] [address]  DIAGNOSIS: Stage IIIA (T1c, N2, M0) non-small cell lung cancer, moderately differentiated adenocarcinoma diagnosed in December 2022.  PD-L1 expression is 70%.  Molecular studies are still pending  PRIOR THERAPY: Status post robotic assisted left video thoracoscopy with emergency thoracotomy and left upper lobectomy and mediastinal lymph node sampling under the care of Dr. Kipp Brood on November 15, 2021  CURRENT THERAPY: None  INTERVAL HISTORY: [name] 70 y.o. male returns to the clinic today for follow-up visit accompanied by his wife.  The patient is feeling fine today with no concerning complaints except for the residual pain on the left side of the chest from the surgical scar.  He underwent left upper lobectomy with mediastinal lymph node sampling on November 15, 2021 under the care of Dr. Kipp Brood.  He is recovering slowly from his surgery.  He has injury to 1 of the pulmonary vessels during the procedure and he had emergency thoracotomy for control of the bleeding.  He denied having any current cough or shortness of breath or hemoptysis.  He has no nausea, vomiting, diarrhea or constipation.  He has no headache or visual changes.  [facility] molecular studies and PD-L1 expression.  He is here today for evaluation and recommendation regarding adjuvant treatment.  MEDICAL HISTORY: Past Medical History:  Diagnosis Date   Diabetes mellitus ([place])    Dyslipidemia    Hypertension     ALLERGIES:  has No Known Allergies.  MEDICATIONS:  Current Outpatient Medications  Medication Sig Dispense Refill   atenolol (TENORMIN) 100 MG tablet Take 100 mg by mouth daily.     atorvastatin (LIPITOR) 20 MG tablet Take 20 mg by mouth daily.     famotidine (PEPCID) 20 MG tablet Take  20 mg by mouth daily.     gabapentin (NEURONTIN) 300 MG capsule Take 1 capsule (300 mg total) by mouth 3 (three) times daily. 90 capsule 2   guaiFENesin (MUCINEX) 600 MG 12 hr tablet Take 2 tablets (1,200 mg total) by mouth 2 (two) times daily. Recommend using for 1 week after discharge.     losartan-hydrochlorothiazide (HYZAAR) 100-12.5 MG tablet Take 1 tablet by mouth daily.     metFORMIN (GLUCOPHAGE-XR) 500 MG 24 hr tablet Take 1,000 mg by mouth every evening.     oxyCODONE-acetaminophen (PERCOCET) 10-325 MG tablet Take 1 tablet by mouth every 4 (four) hours as needed for pain. 60 tablet 0   No current facility-administered medications for this visit.    SURGICAL HISTORY:  Past Surgical History:  Procedure Laterality Date   BRONCHIAL BIOPSY  11/15/2021   Procedure: BRONCHIAL BIOPSIES;  Surgeon: Garner Nash, DO;  Location: [place] [facility];  Service: Pulmonary;;   BRONCHIAL BRUSHINGS  11/15/2021   Procedure: BRONCHIAL BRUSHINGS;  Surgeon: Garner Nash, DO;  Location: [place] [facility];  Service: Pulmonary;;   FIDUCIAL MARKER PLACEMENT  11/15/2021   Procedure: FIDUCIAL DYE MARKING;  Surgeon: Garner Nash, DO;  Location: [place] [facility];  Service: Pulmonary;;   INGUINAL HERNIA REPAIR     INTERCOSTAL NERVE BLOCK Left 11/15/2021   Procedure: INTERCOSTAL NERVE BLOCK;  Surgeon: Lajuana Matte, MD;  Location: [place];  Service: Thoracic;  Laterality: Left;   LOBECTOMY Left 11/15/2021   Procedure: LEFT UPPER LOBECTOMY;  Surgeon: Lajuana Matte, MD;  Location: [place];  Service: Thoracic;  Laterality: Left;   MEDIASTINAL EXPLORATION Left 11/15/2021   Procedure: MEDIASTINAL LYMPH NODE EXPLORATION;  Surgeon: Lajuana Matte, MD;  Location: [place];  Service: Thoracic;  Laterality: Left;   NODE DISSECTION Left 11/15/2021   Procedure: NODE DISSECTION;  Surgeon: Lajuana Matte, MD;  Location: [place];  Service: Thoracic;  Laterality: Left;   RIB PLATING Left 11/15/2021   Procedure: RIB  PLATING;  Surgeon: Lajuana Matte, MD;  Location: [place];  Service: Thoracic;  Laterality: Left;   THORACOTOMY/LOBECTOMY Left 11/15/2021   Procedure: EMERGENCY THORACOTOMY;  Surgeon: Lajuana Matte, MD;  Location: [place];  Service: Thoracic;  Laterality: Left;   VIDEO BRONCHOSCOPY WITH RADIAL ENDOBRONCHIAL ULTRASOUND  11/15/2021   Procedure: VIDEO BRONCHOSCOPY WITH RADIAL ENDOBRONCHIAL ULTRASOUND;  Surgeon: Garner Nash, DO;  Location: [facility];  Service: Pulmonary;;    REVIEW OF SYSTEMS:  Constitutional: positive for fatigue Eyes: negative Ears, nose, mouth, throat, and face: negative Respiratory: positive for pleurisy/chest pain Cardiovascular: negative Gastrointestinal: negative Genitourinary:negative Integument/breast: negative Hematologic/lymphatic: negative Musculoskeletal:negative Neurological: negative Behavioral/Psych: negative Endocrine: negative Allergic/Immunologic: negative   PHYSICAL EXAMINATION: General appearance: alert, cooperative, fatigued, and no distress Head: Normocephalic, without obvious abnormality, atraumatic Neck: no adenopathy, no JVD, supple, symmetrical, trachea midline, and thyroid not enlarged, symmetric, no tenderness/mass/nodules Lymph nodes: Cervical, supraclavicular, and axillary nodes normal. Resp: clear to auscultation bilaterally Back: symmetric, no curvature. ROM normal. No CVA tenderness. Cardio: regular rate and rhythm, S1, S2 normal, no murmur, click, rub or gallop GI: soft, non-tender; bowel sounds normal; no masses,  no organomegaly Extremities: extremities normal, atraumatic, no cyanosis or edema Neurologic: Alert and oriented X 3, normal strength and tone. Normal symmetric reflexes. Normal coordination and gait  ECOG PERFORMANCE STATUS: 1 - Symptomatic but completely ambulatory  Blood pressure 128/77, pulse 66, temperature (!) 97.3 F (36.3 C), temperature source Tympanic, resp. rate 20, height 5' 11"  (1.803 m), weight  197 lb 11.2 oz (89.7 kg), SpO2 100 %.  LABORATORY DATA: Lab Results  Component Value Date   WBC 8.1 11/23/2021   HGB 9.1 (L) 11/23/2021   HCT 27.4 (L) 11/23/2021   MCV 91.3 11/23/2021   PLT 259 11/23/2021      Chemistry      Component Value Date/Time   NA 138 11/23/2021 0500   K 4.0 11/23/2021 0500   CL 103 11/23/2021 0500   CO2 25 11/23/2021 0500   BUN 24 (H) 11/23/2021 0500   CREATININE 1.13 11/23/2021 0500   CREATININE 1.74 (H) 09/12/2021 1350      Component Value Date/Time   CALCIUM 8.5 (L) 11/23/2021 0500   ALKPHOS 39 11/17/2021 0440   AST 40 11/17/2021 0440   AST 19 09/12/2021 1350   ALT 24 11/17/2021 0440   ALT 15 09/12/2021 1350   BILITOT 1.0 11/17/2021 0440   BILITOT 0.6 09/12/2021 1350       RADIOGRAPHIC STUDIES: DG Chest 1 View  Result Date: 11/19/2021 CLINICAL DATA:  Status post left thoracotomy EXAM: CHEST  1 VIEW COMPARISON:  Previous studies including the examination done earlier today FINDINGS: Transverse diameter of heart is slightly increased. Thoracic aorta is tortuous. There are linear densities in the left parahilar region and both lower lung fields. There are no signs of alveolar pulmonary edema. Left chest tube is noted. There is previous internal fixation in the 2 of the left ribs. There is interval decrease in subcutaneous emphysema. There is no pneumothorax. There is minimal blunting of left lateral CP angle.  IMPRESSION: There is no pneumothorax. Increased markings in the left mid and both lower lung fields suggest subsegmental atelectasis. Electronically Signed   By: Elmer Picker M.D.   On: 11/19/2021 13:24   DG Chest 2 View  Result Date: 11/30/2021 CLINICAL DATA:  Shortness of breath. EXAM: CHEST - 2 VIEW COMPARISON:  November 23, 2021. FINDINGS: The heart size and mediastinal contours are within normal limits. Right lung is clear. Stable left basilar atelectasis is noted. Stable left upper lobe opacity is noted concerning for atelectasis  or loculated effusion. Postsurgical changes are seen involving 2 left ribs. IMPRESSION: Stable left lung opacities as described above. Electronically Signed   By: Marijo Conception M.D.   On: 11/30/2021 10:04   DG Chest 2 View  Result Date: 11/23/2021 CLINICAL DATA:  Recent left lung surgery EXAM: CHEST - 2 VIEW COMPARISON:  Previous studies including the examination of 11/22/2021 FINDINGS: Transverse diameter of heart is increased. There is decreased aeration in the left lung. There is possible loculated effusion in the left pleural space. Metallic plate and surgical screws seen in the left ribs. Linear densities seen in the left parahilar region and lower lung fields. There is some improvement in aeration of right lower lung fields. Tip of central venous catheter is seen in the region of superior vena cava. Subcutaneous emphysema is seen in the left chest wall. There is no demonstrable pneumothorax. IMPRESSION: Cardiomegaly. Left pleural effusion. There is interval decrease in subsegmental atelectasis in the right lower lung fields. Electronically Signed   By: Elmer Picker M.D.   On: 11/23/2021 09:19   DG Chest 2 View  Result Date: 11/21/2021 CLINICAL DATA:  Shortness of breath EXAM: CHEST - 2 VIEW COMPARISON:  11/20/2021 FINDINGS: Postoperative changes on the left. Subcutaneous air in the left chest wall decreasing since prior study. Air-fluid level again seen in the left upper chest, best seen on lateral view compatible with loculated hydropneumothorax. Airspace opacity throughout the left lung is unchanged. Right lung clear. Heart is normal size. IMPRESSION: Postoperative changes on the left with loculated left upper hemithorax hydropneumothorax, best seen on lateral view. Stable left lung airspace opacities. Electronically Signed   By: Rolm Baptise M.D.   On: 11/21/2021 09:30   DG Chest 2 View  Result Date: 11/20/2021 CLINICAL DATA:  Status post thoracotomy and left upper lobe lobectomy. EXAM:  CHEST - 2 VIEW COMPARISON:  Multiple previous chest x-rays. The most recent is from yesterday. FINDINGS: The left-sided chest tube is been removed. There is a discrete air-fluid level in the left upper thorax which is likely a loculated hydropneumothorax related to the surgery. Persistent left lower lobe atelectasis. Stable left-sided subcutaneous emphysema. The right lung remains clear. The right IJ catheter is stable. IMPRESSION: Interval removal of the left-sided chest tube with a discrete air-fluid level in the left upper thorax likely a loculated hydropneumothorax related to the surgery. Electronically Signed   By: Marijo Sanes M.D.   On: 11/20/2021 08:28   DG Chest 2 View  Result Date: 11/13/2021 CLINICAL DATA:  Left upper lobe pulmonary nodule.  Pre biopsy. EXAM: CHEST - 2 VIEW COMPARISON:  09/04/2021 FINDINGS: Midline trachea. Normal heart size and mediastinal contours. No pleural effusion or pneumothorax. 3.2 cm left upper lobe lung mass, as on PET. IMPRESSION: Left upper lobe lung mass, as before. No acute superimposed process. Electronically Signed   By: Abigail Miyamoto M.D.   On: 11/13/2021 16:53   DG CHEST PORT 1 VIEW  Result  Date: 11/22/2021 CLINICAL DATA:  Hypoxia.  History of lung nodule EXAM: PORTABLE CHEST 1 VIEW COMPARISON:  11/21/2021 FINDINGS: Right jugular central venous catheter in the mid SVC unchanged in position. No right pneumothorax. Minimal right lower lobe atelectasis Postsurgical change on the left. Surgical staples left hilum. Left lower lobe atelectasis and small effusion. Plate and screw stabilization of 2 left rib fractures unchanged. Subcutaneous gas in the left chest wall. No pneumothorax identified on this portable view. Probable left hydropneumothorax in the apex seen on the previous lateral view. IMPRESSION: No significant interval change. Bibasilar atelectasis left greater than right. Small left effusion. Previously noted left apical hydropneumothorax not seen on the  current study likely due to projection. Electronically Signed   By: Franchot Gallo M.D.   On: 11/22/2021 08:52   DG Chest Port 1 View  Result Date: 11/19/2021 CLINICAL DATA:  Follow-up left chest tube EXAM: PORTABLE CHEST 1 VIEW COMPARISON:  11/17/2021 FINDINGS: Left thoracostomy tube remains in place. No visible pleural air. Slight worsening of atelectasis of the left lung. Similar minimal atelectasis at the right lung base. Right internal jugular central line tip in the SVC above the right atrium. Air in the soft tissues of the left chest appears similar to the previous study. IMPRESSION: Left chest tube remains in place. No visible pleural air. Slight worsening of atelectasis in the left lung. Mild persistent atelectasis at the right lung base. Electronically Signed   By: Nelson Chimes M.D.   On: 11/19/2021 08:27   DG Chest Port 1 View  Result Date: 11/17/2021 CLINICAL DATA:  Pleural effusion EXAM: PORTABLE CHEST 1 VIEW COMPARISON:  11/16/2021 FINDINGS: Left chest tube and right IJ central line again identified. Small left pneumothorax. Pleural line is not well seen on the prior study but there is decreased lucency suggesting the pneumothorax is smaller. Similar emphysema in the left chest wall. Patchy atelectasis. Stable cardiomediastinal contours. Postoperative changes of the left ribs. IMPRESSION: Small left pneumothorax. Pleural line is not well seen on the prior study but lucency is decreased suggesting smaller pneumothorax. Electronically Signed   By: Macy Mis M.D.   On: 11/17/2021 10:09   DG Chest Port 1 View  Addendum Date: 11/16/2021   ADDENDUM REPORT: 11/16/2021 09:16 ADDENDUM: Critical Value/emergent results were called by telephone at the time of interpretation on 11/16/2021 at 8:58 am to provider WAYNE GOLD , who verbally acknowledged these results. Electronically Signed   By: Richardean Sale M.D.   On: 11/16/2021 09:16   Result Date: 11/16/2021 CLINICAL DATA:  Chest tube present  status post lobectomy. Chest pain. EXAM: PORTABLE CHEST 1 VIEW COMPARISON:  Radiographs 11/15/2021 and 11/13/2021.  CT 11/05/2021. FINDINGS: 0509 hours. Right IJ central venous catheter is unchanged, extending to the level of the mid SVC. The endotracheal tube and enteric tube have been removed in the interval. Left chest tube is unchanged in position. There is increased lucency superiorly in the left hemithorax suspicious for an enlarging pneumothorax. A pleural edge is not well defined. There is increased soft tissue emphysema throughout the left lateral chest wall and left neck. Mild atelectasis at both lung bases. There are postsurgical changes in the left lateral ribs. The heart size and mediastinal contours are stable without mediastinal shift. IMPRESSION: Although a pleural edge is not well defined, there is increased lucency superiorly in the left hemithorax and increased soft tissue emphysema within the left chest wall and lower neck is suspicious for an enlarging pneumothorax. Left chest tube remains in  place. Electronically Signed: By: Richardean Sale M.D. On: 11/16/2021 08:53   DG CHEST PORT 1 VIEW  Result Date: 11/15/2021 CLINICAL DATA:  Status post thoracotomy and left upper lobe lobectomy. EXAM: PORTABLE CHEST 1 VIEW COMPARISON:  Chest x-ray 11/13/2021 FINDINGS: The endotracheal tube is approximately 5.8 cm above the carina. There is an NG tube coursing down the esophagus and into the stomach. Right IJ central venous catheter in good position in the mid distal SVC. A left-sided chest tube is in place. No left-sided pneumothorax is identified. Surgical changes noted from a left thoracotomy and left upper lobe lobectomy. There are mini plate and screws noted on the left ribs. Low lung volumes with streaky bibasilar atelectasis but no edema or effusion. IMPRESSION: 1. Support apparatus in good position without complicating features. 2. Left-sided chest tube in place. No pneumothorax. 3. Low lung  volumes with streaky bibasilar atelectasis. Electronically Signed   By: Marijo Sanes M.D.   On: 11/15/2021 15:47   Myocardial Perfusion Imaging  Result Date: 11/05/2021   The study is normal. The study is low risk.   No ST deviation was noted.   LV perfusion is normal. There is no evidence of ischemia. There is no evidence of infarction.   Left ventricular function is abnormal. Global function is mildly reduced. Nuclear stress EF: 50 %. The left ventricular ejection fraction is mildly decreased (45-54%). End diastolic cavity size is normal. End systolic cavity size is normal.   Prior study not available for comparison. Fixed apical anterior perfusion defect with normal wall motion, consistent with artifact Low risk study  CT Super D Chest Wo Contrast  Result Date: 11/06/2021 CLINICAL DATA:  Lung nodule follow-up EXAM: CT CHEST WITHOUT CONTRAST TECHNIQUE: Multidetector CT imaging of the chest was performed using thin slice collimation for electromagnetic bronchoscopy planning purposes, without intravenous contrast. COMPARISON:  PET-CT dated September 27, 2021; CT angio chest dated September 04, 2021 FINDINGS: Cardiovascular: Mild cardiomegaly. Coronary artery calcifications of the LAD and RCA. Atherosclerotic disease of the thoracic aorta. Mediastinum/Nodes: Right supraclavicular lymph node measuring 7 mm in short axis, unchanged compared to prior exams, was not hypermetabolic on prior PET-CT. No pathologically enlarged lymph nodes seen in the chest. Small hiatal hernia. Thyroid is unremarkable. Lungs/Pleura: Spiculated left upper lobe nodule measures 2.7 x 2.2 cm, unchanged compared to prior exam when remeasured in similar plane. No new or enlarging pulmonary nodules. Upper Abdomen: Partially visualized exophytic simple cyst of the left kidney. No acute abnormality. Musculoskeletal: Subacute left posterolateral rib fractures. Nondisplaced fracture extending through the T5 vertebral body which does not  involve the posterior wall. 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 Increased sclerosis of the lateral left rib, likely due to healing fracture. Unchanged sclerotic lesion of the lateral T9 rib, likely a bone island or sequela of prior fracture. IMPRESSION: 1. Spiculated nodule of the left upper lobe is unchanged in size compared to prior exam. 2. No pathologically enlarged lymph nodes seen in the chest. Subcentimeter right supraclavicular lymph node is unchanged in size when compared with prior exams and was not hypermetabolic on PET-CT, likely reactive. 3. Nondisplaced fracture extending through the T5 vertebral body which does not involve the posterior wall with a background of diffuse idiopathic skeletal hyperostosis (DISH). This finding correlates with previously seen focal FDG uptake of the T5 vertebral body and is likely subacute. 4. Subacute left posterolateral rib fractures with evidence of interval healing. 5.  Aortic Atherosclerosis (ICD10-I70.0). These results will be called  to the ordering clinician or representative by the Radiologist Assistant, and communication documented in the PACS or [employer]. Electronically Signed   By: Yetta Glassman M.D.   On: 11/06/2021 09:14   DG C-ARM BRONCHOSCOPY  Result Date: 11/15/2021 C-ARM BRONCHOSCOPY: Fluoroscopy was utilized by the requesting physician.  No radiographic interpretation.    ASSESSMENT AND PLAN: This is a very pleasant 70 years old white male diagnosed with a stage IIIa (T1c, N2, M0) non-small cell lung cancer, adenocarcinoma presented with left upper lobe lung nodule in addition to mediastinal lymphadenopathy diagnosed in December 2022.  The patient is status post left upper lobectomy with lymph node dissection.  The resection margins were negative for malignancy and there was no visceral pleural involvement but lymphovascular invasion. Molecular studies are still pending but PD-L1 expression is 70%. I  had a lengthy discussion with the patient and his wife today about his current condition and treatment options. I recommended for the patient consideration of adjuvant systemic chemotherapy with cisplatin 75 Mg/M2 and Alimta 500 Mg/M2 every 3 weeks for 4 cycles followed by adjuvant immunotherapy or targeted therapy if the molecular studies showed positive EGFR mutation. The patient would like some time to think about this option. He will come back for follow-up visit in 3 weeks for reevaluation and more discussion of this treatment options after recovering from surgery. If he decides to proceed with surgery, we expect him to start the first dose of his treatment in around 6-8 weeks from the time of surgery. For the left-sided chest pain, he will continue his current treatment with Percocet as prescribed by Dr. Kipp Brood. The patient was advised to call immediately if he has any other concerning symptoms in the interval The patient voices understanding of current disease status and treatment options and is in agreement with the current care plan.  All questions were answered. The patient knows to call the clinic with any problems, questions or concerns. We can certainly see the patient much sooner if necessary.  The total time spent in the appointment was 30 minutes.  Disclaimer: This note was dictated with voice recognition software. Similar sounding words can inadvertently be transcribed and may not be corrected upon review.

## 2021-12-10 ENCOUNTER — Encounter: Payer: Self-pay | Admitting: Internal Medicine

## 2021-12-12 ENCOUNTER — Encounter: Payer: Self-pay | Admitting: *Deleted

## 2021-12-12 ENCOUNTER — Telehealth: Payer: Self-pay | Admitting: Internal Medicine

## 2021-12-12 NOTE — Progress Notes (Signed)
Oncology Nurse Navigator Documentation  Oncology Nurse Navigator Flowsheets 12/12/2021 11/29/2021  Abnormal Finding Date 08/17/2021 -  Confirmed Diagnosis Date 11/15/2021 -  Diagnosis Status Pending Molecular Studies -  Planned Course of Treatment Surgery;Targeted Therapy -  Surgery Actual Start Date: 11/15/2021 -  Navigator Follow Up Date: 01/01/2022 12/11/2021  Navigator Follow Up Reason: Follow-up Appointment Appointment Review  Navigator Location [facility]  Navigator Encounter Type Pathology Review Pathology Review  Treatment Initiated Date 11/15/2021 -  Patient Visit Type Other Other  Treatment Phase Other Other  Barriers/Navigation Needs Coordination of Care/I followed up on Mr. Surrette molecular test with [facility]. [facility] 1 is completed and Dr.  Julien Nordmann is aware of results. Moleculars are still pending at this time.  Coordination of Care  Interventions Coordination of Care Coordination of Care  Acuity Level 2-Minimal Needs (1-2 Barriers Identified) Level 2-Minimal Needs (1-2 Barriers Identified)  Coordination of Care Pathology Pathology  Time Spent with Patient 45 30

## 2021-12-12 NOTE — Telephone Encounter (Signed)
Sch per12/20 los, left msg °

## 2021-12-13 ENCOUNTER — Encounter (HOSPITAL_COMMUNITY): Payer: Self-pay

## 2021-12-18 ENCOUNTER — Encounter (HOSPITAL_COMMUNITY): Payer: Self-pay

## 2021-12-20 ENCOUNTER — Telehealth: Payer: Self-pay | Admitting: *Deleted

## 2021-12-20 NOTE — Telephone Encounter (Signed)
Patient's wife, Sydell Axon, contacted the office stating patient is still experiencing pain s/p RATS, thoracotomy 12/1 by Dr. Kipp Brood. Per wife, patient was taking percocet but it did not help his pain. Patient has prescription for gabapentin but patient states he is not taking. Patient asked to describe pain stating "pain is pain" and he is currently at an 8. Patient currently taking aleve for pain. Advised patient that if he is experiencing neuropathic pain then gabapentin is best to use for management. Encouraged patient to contact his pharmacy for medication. Virtual appt made for patient to further discuss with Dr. Kipp Brood tomorrow. Patient aware of appt.

## 2021-12-21 ENCOUNTER — Ambulatory Visit (INDEPENDENT_AMBULATORY_CARE_PROVIDER_SITE_OTHER): Payer: Self-pay | Admitting: Thoracic Surgery (Cardiothoracic Vascular Surgery)

## 2021-12-21 ENCOUNTER — Other Ambulatory Visit: Payer: Self-pay

## 2021-12-21 DIAGNOSIS — Z09 Encounter for follow-up examination after completed treatment for conditions other than malignant neoplasm: Secondary | ICD-10-CM

## 2021-12-21 MED ORDER — METHOCARBAMOL 500 MG PO TABS: 500.0000 mg | ORAL_TABLET | Freq: Three times a day (TID) | ORAL | 0 refills | Status: DC

## 2021-12-21 MED ORDER — OXYCODONE-ACETAMINOPHEN 10-325 MG PO TABS: 1.0000 | ORAL_TABLET | ORAL | 0 refills | Status: DC | PRN

## 2021-12-21 NOTE — Progress Notes (Signed)
°   °  [name]       [place] [address]             [phone]       Patient: Home Provider: Office Consent for Telemedicine visit obtained.  Todays visit was completed via a real-time telehealth (see specific modality noted below). The patient/authorized person provided oral consent at the time of the visit to engage in a telemedicine encounter with the present provider at [facility]. The patient/authorized person was informed of the potential benefits, limitations, and risks of telemedicine. The patient/authorized person expressed understanding that the laws that protect confidentiality also apply to telemedicine. The patient/authorized person acknowledged understanding that telemedicine does not provide emergency services and that he or she would need to call 911 or proceed to the nearest hospital for help if such a need arose.   Total time spent in the clinical discussion 10 minutes.  Telehealth Modality: Phone visit (audio only)  I had a telephone visit with [name].  He continues complain of thoracotomy pain.  He states that he was able to take the gabapentin as well as some Aleve which helped him sleep at night.  I refilled his Percocet medication, and given him a prescription for Robaxin.  He is scheduled to meet with Dr. Julien Nordmann on January 17.  He will follow-up with me on January 20.

## 2022-01-01 ENCOUNTER — Encounter: Payer: Self-pay | Admitting: *Deleted

## 2022-01-01 ENCOUNTER — Inpatient Hospital Stay: Payer: Medicare Other | Attending: Internal Medicine

## 2022-01-01 ENCOUNTER — Other Ambulatory Visit: Payer: Self-pay

## 2022-01-01 ENCOUNTER — Inpatient Hospital Stay (HOSPITAL_BASED_OUTPATIENT_CLINIC_OR_DEPARTMENT_OTHER): Payer: Medicare Other | Admitting: Internal Medicine

## 2022-01-01 ENCOUNTER — Encounter: Payer: Self-pay | Admitting: Internal Medicine

## 2022-01-01 VITALS — BP 123/83 | HR 87 | Temp 97.0°F | Resp 19 | Ht 71.0 in | Wt 190.3 lb

## 2022-01-01 DIAGNOSIS — Z5189 Encounter for other specified aftercare: Secondary | ICD-10-CM | POA: Insufficient documentation

## 2022-01-01 DIAGNOSIS — Z5111 Encounter for antineoplastic chemotherapy: Secondary | ICD-10-CM

## 2022-01-01 DIAGNOSIS — I10 Essential (primary) hypertension: Secondary | ICD-10-CM

## 2022-01-01 DIAGNOSIS — C3412 Malignant neoplasm of upper lobe, left bronchus or lung: Secondary | ICD-10-CM

## 2022-01-01 DIAGNOSIS — E119 Type 2 diabetes mellitus without complications: Secondary | ICD-10-CM | POA: Diagnosis not present

## 2022-01-01 DIAGNOSIS — C3492 Malignant neoplasm of unspecified part of left bronchus or lung: Secondary | ICD-10-CM

## 2022-01-01 LAB — CBC WITH DIFFERENTIAL (CANCER CENTER ONLY)
Abs Immature Granulocytes: 0.01 10*3/uL (ref 0.00–0.07)
Basophils Absolute: 0.1 10*3/uL (ref 0.0–0.1)
Basophils Relative: 1 %
Eosinophils Absolute: 0.3 10*3/uL (ref 0.0–0.5)
Eosinophils Relative: 3 %
HCT: 38.4 % — ABNORMAL LOW (ref 39.0–52.0)
Hemoglobin: 12.4 g/dL — ABNORMAL LOW (ref 13.0–17.0)
Immature Granulocytes: 0 %
Lymphocytes Relative: 44 %
Lymphs Abs: 3.9 10*3/uL (ref 0.7–4.0)
MCH: 28.2 pg (ref 26.0–34.0)
MCHC: 32.3 g/dL (ref 30.0–36.0)
MCV: 87.5 fL (ref 80.0–100.0)
Monocytes Absolute: 0.9 10*3/uL (ref 0.1–1.0)
Monocytes Relative: 11 %
Neutro Abs: 3.6 10*3/uL (ref 1.7–7.7)
Neutrophils Relative %: 41 %
Platelet Count: 223 10*3/uL (ref 150–400)
RBC: 4.39 MIL/uL (ref 4.22–5.81)
RDW: 14.3 % (ref 11.5–15.5)
WBC Count: 8.8 10*3/uL (ref 4.0–10.5)
nRBC: 0 % (ref 0.0–0.2)

## 2022-01-01 LAB — CMP (CANCER CENTER ONLY)
ALT: 10 U/L (ref 0–44)
AST: 16 U/L (ref 15–41)
Albumin: 4.2 g/dL (ref 3.5–5.0)
Alkaline Phosphatase: 75 U/L (ref 38–126)
Anion gap: 12 (ref 5–15)
BUN: 20 mg/dL (ref 8–23)
CO2: 23 mmol/L (ref 22–32)
Calcium: 9.6 mg/dL (ref 8.9–10.3)
Chloride: 105 mmol/L (ref 98–111)
Creatinine: 1.85 mg/dL — ABNORMAL HIGH (ref 0.61–1.24)
GFR, Estimated: 39 mL/min — ABNORMAL LOW (ref >60)
Glucose, Bld: 171 mg/dL — ABNORMAL HIGH (ref 70–99)
Potassium: 3.6 mmol/L (ref 3.5–5.1)
Sodium: 140 mmol/L (ref 135–145)
Total Bilirubin: 0.4 mg/dL (ref 0.3–1.2)
Total Protein: 7.6 g/dL (ref 6.5–8.1)

## 2022-01-01 MED ORDER — PROCHLORPERAZINE MALEATE 10 MG PO TABS: 10.0000 mg | ORAL_TABLET | Freq: Four times a day (QID) | ORAL | 0 refills | Status: DC | PRN

## 2022-01-01 NOTE — Progress Notes (Signed)
Oncology Nurse Navigator Documentation  Oncology Nurse Navigator Flowsheets 01/01/2022 12/12/2021 11/29/2021  Abnormal Finding Date - 08/17/2021 -  Confirmed Diagnosis Date - 11/15/2021 -  Diagnosis Status - Pending Molecular Studies -  Planned Course of Treatment Chemotherapy Surgery;Targeted Therapy -  Phase of Treatment Chemo - -  Surgery Actual Start Date: - 11/15/2021 -  Navigator Follow Up Date: 01/04/2022 01/01/2022 12/11/2021  Navigator Follow Up Reason: Appointment Review Follow-up Appointment Appointment Review  Navigator Location [facility]  Navigator Encounter Type Clinic/MDC Pathology Review Pathology Review  Treatment Initiated Date - 11/15/2021 -  Patient Visit Type MedOnc;Follow-up Other Other  Treatment Phase Pre-Tx/Tx Discussion Other Other  Barriers/Navigation Needs Education/I spoke to patient and his wife today at clinic. He will be starting adjuvant chemo next week. I helped to explain plan of care.  Will follow up on appts  Coordination of Care Coordination of Care  Education Other - -  Interventions Coordination of Care;Psycho-Social Support Coordination of Care Coordination of Care  Acuity Level 3-Moderate Needs (3-4 Barriers Identified) Level 2-Minimal Needs (1-2 Barriers Identified) Level 2-Minimal Needs (1-2 Barriers Identified)  Coordination of Care - Pathology Pathology  Time Spent with Patient [phone]

## 2022-01-01 NOTE — Progress Notes (Signed)
START ON PATHWAY REGIMEN - Non-Small Cell Lung     A cycle is every 21 days:     Paclitaxel      Carboplatin   **Always confirm dose/schedule in your pharmacy ordering system**  Patient Characteristics: Postoperative without Neoadjuvant Therapy (Pathologic Staging), Stage III, Adjuvant Chemotherapy, Nonsquamous Cell Therapeutic Status: Postoperative without Neoadjuvant Therapy (Pathologic Staging) AJCC T Category: pT1c AJCC N Category: pN2 AJCC M Category: cM0 AJCC 8 Stage Grouping: IIIA Histology: Nonsquamous Cell Intent of Therapy: Curative Intent, Discussed with Patient

## 2022-01-01 NOTE — Progress Notes (Signed)
[place] [phone]   [phone]  OFFICE PROGRESS NOTE  [facility] [place] [address]  DIAGNOSIS: Stage IIIA (T1c, N2, M0) non-small cell lung cancer, moderately differentiated adenocarcinoma diagnosed in December 2022.  PD-L1 expression is 70%.    Molecular study showed no actionable mutation.  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 Kipp Brood on November 15, 2021  CURRENT THERAPY: Adjuvant systemic chemotherapy with carboplatin for AUC of 6 and paclitaxel 200 Mg/M2 with Neulasta support every 3 weeks.  First dose January 08, 2022.  The patient was not eligible for treatment with cisplatin and Alimta because of renal insufficiency.  INTERVAL HISTORY: [name] 71 y.o. male returns to the clinic today for follow-up visit accompanied by his wife.  He continues to have pain on the left side of the chest after the surgical resection.  He denied having any cough, shortness of breath or hemoptysis.  He lost few pounds since his last visit.  He denied having any nausea, vomiting, diarrhea, abdominal pain or constipation.  He has no headache or visual changes.  He has no fever or chills.  The patient had molecular studies by foundation 1 that showed no actionable mutations and his PD-L1 expression was 70%.  He is here today for evaluation and discussion of his treatment options based on the new findings.  MEDICAL HISTORY: Past Medical History:  Diagnosis Date   Diabetes mellitus ([place])    Dyslipidemia    Hypertension     ALLERGIES:  has No Known Allergies.  MEDICATIONS:  Current Outpatient Medications  Medication Sig Dispense Refill   atenolol (TENORMIN) 100 MG tablet Take 100 mg by mouth daily.     atorvastatin (LIPITOR) 20 MG tablet  Take 20 mg by mouth daily.     famotidine (PEPCID) 20 MG tablet Take 20 mg by mouth daily.     gabapentin (NEURONTIN) 300 MG capsule Take 1 capsule (300 mg total) by mouth 3 (three) times daily. 90 capsule 2   guaiFENesin (MUCINEX) 600 MG 12 hr tablet Take 2 tablets (1,200 mg total) by mouth 2 (two) times daily. Recommend using for 1 week after discharge. (Patient not taking: Reported on 12/04/2021)     losartan-hydrochlorothiazide (HYZAAR) 100-12.5 MG tablet Take 1 tablet by mouth daily.     metFORMIN (GLUCOPHAGE-XR) 500 MG 24 hr tablet Take 1,000 mg by mouth every evening.     methocarbamol (ROBAXIN) 500 MG tablet Take 1 tablet (500 mg total) by mouth 3 (three) times daily. 90 tablet 0   oxyCODONE-acetaminophen (PERCOCET) 10-325 MG tablet Take 1 tablet by mouth every 4 (four) hours as needed for pain. 60 tablet 0   No current facility-administered medications for this visit.    SURGICAL HISTORY:  Past Surgical History:  Procedure Laterality Date   BRONCHIAL BIOPSY  11/15/2021   Procedure: BRONCHIAL BIOPSIES;  Surgeon: Garner Nash, DO;  Location: [facility];  Service: Pulmonary;;   BRONCHIAL BRUSHINGS  11/15/2021   Procedure: BRONCHIAL BRUSHINGS;  Surgeon: Garner Nash, DO;  Location: [facility];  Service: Pulmonary;;   FIDUCIAL MARKER PLACEMENT  11/15/2021   Procedure: FIDUCIAL DYE MARKING;  Surgeon: Garner Nash, DO;  Location: [facility];  Service: Pulmonary;;   INGUINAL HERNIA REPAIR     INTERCOSTAL NERVE BLOCK Left 11/15/2021  Procedure: INTERCOSTAL NERVE BLOCK;  Surgeon: Lajuana Matte, MD;  Location: [facility];  Service: Thoracic;  Laterality: Left;   LOBECTOMY Left 11/15/2021   Procedure: LEFT UPPER LOBECTOMY;  Surgeon: Lajuana Matte, MD;  Location: [facility];  Service: Thoracic;  Laterality: Left;   MEDIASTINAL EXPLORATION Left 11/15/2021   Procedure: MEDIASTINAL LYMPH NODE EXPLORATION;  Surgeon: Lajuana Matte, MD;  Location: [facility] [place];  Service: Thoracic;   Laterality: Left;   NODE DISSECTION Left 11/15/2021   Procedure: NODE DISSECTION;  Surgeon: Lajuana Matte, MD;  Location: [facility];  Service: Thoracic;  Laterality: Left;   RIB PLATING Left 11/15/2021   Procedure: RIB PLATING;  Surgeon: Lajuana Matte, MD;  Location: [facility];  Service: Thoracic;  Laterality: Left;   THORACOTOMY/LOBECTOMY Left 11/15/2021   Procedure: EMERGENCY THORACOTOMY;  Surgeon: Lajuana Matte, MD;  Location: [facility];  Service: Thoracic;  Laterality: Left;   VIDEO BRONCHOSCOPY WITH RADIAL ENDOBRONCHIAL ULTRASOUND  11/15/2021   Procedure: VIDEO BRONCHOSCOPY WITH RADIAL ENDOBRONCHIAL ULTRASOUND;  Surgeon: Garner Nash, DO;  Location: [facility];  Service: Pulmonary;;    REVIEW OF SYSTEMS:  Constitutional: positive for anorexia, fatigue, and weight loss Eyes: negative Ears, nose, mouth, throat, and face: negative Respiratory: positive for pleurisy/chest pain Cardiovascular: negative Gastrointestinal: negative Genitourinary:negative Integument/breast: negative Hematologic/lymphatic: negative Musculoskeletal:negative Neurological: negative Behavioral/Psych: negative Endocrine: negative Allergic/Immunologic: negative   PHYSICAL EXAMINATION: General appearance: alert, cooperative, fatigued, and no distress Head: Normocephalic, without obvious abnormality, atraumatic Neck: no adenopathy, no JVD, supple, symmetrical, trachea midline, and thyroid not enlarged, symmetric, no tenderness/mass/nodules Lymph nodes: Cervical, supraclavicular, and axillary nodes normal. Resp: clear to auscultation bilaterally Back: symmetric, no curvature. ROM normal. No CVA tenderness. Cardio: regular rate and rhythm, S1, S2 normal, no murmur, click, rub or gallop GI: soft, non-tender; bowel sounds normal; no masses,  no organomegaly Extremities: extremities normal, atraumatic, no cyanosis or edema Neurologic: Alert and oriented X 3, normal strength and tone. Normal symmetric  reflexes. Normal coordination and gait  ECOG PERFORMANCE STATUS: 1 - Symptomatic but completely ambulatory  Blood pressure 123/83, pulse 87, temperature (!) 97 F (36.1 C), temperature source Tympanic, resp. rate 19, height 5' 11"  (1.803 m), weight 190 lb 4.8 oz (86.3 kg), SpO2 100 %.  LABORATORY DATA: Lab Results  Component Value Date   WBC 8.8 01/01/2022   HGB 12.4 (L) 01/01/2022   HCT 38.4 (L) 01/01/2022   MCV 87.5 01/01/2022   PLT 223 01/01/2022      Chemistry      Component Value Date/Time   NA 138 11/23/2021 0500   K 4.0 11/23/2021 0500   CL 103 11/23/2021 0500   CO2 25 11/23/2021 0500   BUN 24 (H) 11/23/2021 0500   CREATININE 1.13 11/23/2021 0500   CREATININE 1.74 (H) 09/12/2021 1350      Component Value Date/Time   CALCIUM 8.5 (L) 11/23/2021 0500   ALKPHOS 39 11/17/2021 0440   AST 40 11/17/2021 0440   AST 19 09/12/2021 1350   ALT 24 11/17/2021 0440   ALT 15 09/12/2021 1350   BILITOT 1.0 11/17/2021 0440   BILITOT 0.6 09/12/2021 1350       RADIOGRAPHIC STUDIES: No results found.  ASSESSMENT AND PLAN: This is a very pleasant 71 years old white male diagnosed with a stage IIIa (T1c, N2, M0) non-small cell lung cancer, adenocarcinoma presented with left upper lobe lung nodule in addition to mediastinal lymphadenopathy diagnosed in December 2022.  The patient is status post left upper lobectomy  with lymph node dissection.  The resection margins were negative for malignancy and there was no visceral pleural involvement but lymphovascular invasion. The patient has no actionable mutations and PD-L1 expression was 70% I had a lengthy discussion with the patient and his wife today about his current condition and treatment options. I recommended for the patient to proceed with adjuvant systemic chemotherapy for 4 cycles followed by adjuvant treatment with immunotherapy with atezolizumab.  Initially will consider The patient for treatment with cisplatin and Alimta but  because of his renal insufficiency, will change the adjuvant systemic chemotherapy to carboplatin for AUC of 6 and paclitaxel 200 Mg/M2 every 3 weeks with Neulasta support. I discussed with the patient the adverse effect of this treatment including but not limited to alopecia, myelosuppression, nausea and vomiting, peripheral neuropathy, liver or renal dysfunction. He is expected to start the first cycle of his treatment next week. The patient will come back for follow-up visit in 2 weeks for evaluation and management of any adverse effect of his treatment. For the left-sided chest pain, he will continue his current treatment with Percocet on a as needed basis as recommended by Dr. Kipp Brood. The patient was advised to call immediately if he has any other concerning symptoms in the interval. The patient voices understanding of current disease status and treatment options and is in agreement with the current care plan.  All questions were answered. The patient knows to call the clinic with any problems, questions or concerns. We can certainly see the patient much sooner if necessary.  The total time spent in the appointment was 40 minutes.  Disclaimer: This note was dictated with voice recognition software. Similar sounding words can inadvertently be transcribed and may not be corrected upon review.

## 2022-01-03 ENCOUNTER — Other Ambulatory Visit: Payer: Self-pay | Admitting: Thoracic Surgery (Cardiothoracic Vascular Surgery)

## 2022-01-03 DIAGNOSIS — C3492 Malignant neoplasm of unspecified part of left bronchus or lung: Secondary | ICD-10-CM

## 2022-01-04 ENCOUNTER — Telehealth: Payer: Self-pay | Admitting: Internal Medicine

## 2022-01-04 ENCOUNTER — Other Ambulatory Visit: Payer: Self-pay

## 2022-01-04 ENCOUNTER — Ambulatory Visit (INDEPENDENT_AMBULATORY_CARE_PROVIDER_SITE_OTHER): Payer: Self-pay | Admitting: Thoracic Surgery (Cardiothoracic Vascular Surgery)

## 2022-01-04 ENCOUNTER — Ambulatory Visit
Admission: RE | Admit: 2022-01-04 | Discharge: 2022-01-04 | Disposition: A | Discharge status: Home / Self Care | Payer: Medicare Other | Source: Ambulatory Visit | Attending: Thoracic Surgery (Cardiothoracic Vascular Surgery) | Admitting: Thoracic Surgery (Cardiothoracic Vascular Surgery)

## 2022-01-04 VITALS — BP 117/69 | HR 69 | Resp 20 | Ht 71.0 in | Wt 190.0 lb

## 2022-01-04 DIAGNOSIS — C3492 Malignant neoplasm of unspecified part of left bronchus or lung: Secondary | ICD-10-CM

## 2022-01-04 DIAGNOSIS — Z09 Encounter for follow-up examination after completed treatment for conditions other than malignant neoplasm: Secondary | ICD-10-CM

## 2022-01-04 MED ORDER — GABAPENTIN 300 MG PO CAPS: 300.0000 mg | ORAL_CAPSULE | Freq: Three times a day (TID) | ORAL | 2 refills | Status: DC

## 2022-01-04 MED ORDER — OXYCODONE-ACETAMINOPHEN 10-325 MG PO TABS: 1.0000 | ORAL_TABLET | ORAL | 0 refills | Status: DC | PRN

## 2022-01-04 MED ORDER — METHOCARBAMOL 500 MG PO TABS
500.0000 mg | ORAL_TABLET | Freq: Three times a day (TID) | ORAL | 0 refills | Status: DC
Start: 2022-01-04 — End: 2022-03-11

## 2022-01-04 NOTE — Progress Notes (Signed)
° °   °  [address]       [place], [address]             [phone]        [facility] Bl[facility] Medical Record #[id number] Date of Birth: 31-Aug-1951  Referring: Heilingoetter, Architectural technologist* Primary Care: [facility] Primary Cardiologist:None  Reason for visit:   follow-up  History of Present Illness:     Patient presents for his 1 month follow-up appointment.  His pain is much better with the Robaxin and Neurontin.  He denies any shortness of breath.  Physical Exam: BP 117/69    Pulse 69    Resp 20    Ht 5\' 11"  (1.803 m)    Wt 190 lb (86.2 kg)    SpO2 98% Comment: RA   BMI 26.50 kg/m   Alert NAD Abdomen  ND No peripheral edema   Diagnostic Studies & Laboratory data: CXR: Clear   Assessment / Plan:   71 year old male status post appendectomy for stage IIIa adenocarcinoma.  Currently doing well.  He is scheduled to start his adjuvant therapy in the next few weeks.  He will continue surveillance with oncology.  Informed him that if he needs any more pain medication he can call the office.  I would like him to use the Neurontin and the Robaxin more to wean himself off of the [place] 01/04/2022 12:49 PM

## 2022-01-04 NOTE — Progress Notes (Signed)
Pharmacist Chemotherapy Monitoring - Initial Assessment    Anticipated start date: 01/08/22   The following has been reviewed per standard work regarding the patient's treatment regimen: The patient's diagnosis, treatment plan and drug doses, and organ/hematologic function Lab orders and baseline tests specific to treatment regimen  The treatment plan start date, drug sequencing, and pre-medications Prior authorization status  Patient's documented medication list, including drug-drug interaction screen and prescriptions for anti-emetics and supportive care specific to the treatment regimen The drug concentrations, fluid compatibility, administration routes, and timing of the medications to be used The patient's access for treatment and lifetime cumulative dose history, if applicable  The patient's medication allergies and previous infusion related reactions, if applicable   Changes made to treatment plan:  N/A  Follow up needed:  Pending authorization for treatment  - chemo auth'd; Ziextenzo PA pending.    The following biosimilar Ziextenzo ([id number]) has been selected for use in this patient.  Kennith Center, Pharm.D., CPP 01/04/2022@3 :10 PM

## 2022-01-04 NOTE — Telephone Encounter (Signed)
Called patient regarding upcoming scheduled appointments, called and left a voicemail.

## 2022-01-07 ENCOUNTER — Inpatient Hospital Stay: Payer: Medicare Other

## 2022-01-07 ENCOUNTER — Other Ambulatory Visit: Payer: Self-pay

## 2022-01-07 MED FILL — Fosaprepitant Dimeglumine For IV Infusion 150 MG (Base Eq): INTRAVENOUS | Qty: 5 | Status: AC

## 2022-01-07 MED FILL — Dexamethasone Sodium Phosphate Inj 100 MG/10ML: INTRAMUSCULAR | Qty: 1 | Status: AC

## 2022-01-08 ENCOUNTER — Inpatient Hospital Stay: Payer: Medicare Other

## 2022-01-08 VITALS — BP 153/75 | HR 57 | Temp 98.0°F | Resp 18 | Wt 190.0 lb

## 2022-01-08 DIAGNOSIS — Z5111 Encounter for antineoplastic chemotherapy: Secondary | ICD-10-CM | POA: Diagnosis not present

## 2022-01-08 DIAGNOSIS — C3492 Malignant neoplasm of unspecified part of left bronchus or lung: Secondary | ICD-10-CM

## 2022-01-08 LAB — CBC WITH DIFFERENTIAL (CANCER CENTER ONLY)
Abs Immature Granulocytes: 0.01 10*3/uL (ref 0.00–0.07)
Basophils Absolute: 0.1 10*3/uL (ref 0.0–0.1)
Basophils Relative: 1 %
Eosinophils Absolute: 0.3 10*3/uL (ref 0.0–0.5)
Eosinophils Relative: 4 %
HCT: 35.2 % — ABNORMAL LOW (ref 39.0–52.0)
Hemoglobin: 11.6 g/dL — ABNORMAL LOW (ref 13.0–17.0)
Immature Granulocytes: 0 %
Lymphocytes Relative: 43 %
Lymphs Abs: 3.2 10*3/uL (ref 0.7–4.0)
MCH: 28.8 pg (ref 26.0–34.0)
MCHC: 33 g/dL (ref 30.0–36.0)
MCV: 87.3 fL (ref 80.0–100.0)
Monocytes Absolute: 0.8 10*3/uL (ref 0.1–1.0)
Monocytes Relative: 11 %
Neutro Abs: 3.1 10*3/uL (ref 1.7–7.7)
Neutrophils Relative %: 41 %
Platelet Count: 191 10*3/uL (ref 150–400)
RBC: 4.03 MIL/uL — ABNORMAL LOW (ref 4.22–5.81)
RDW: 14.6 % (ref 11.5–15.5)
WBC Count: 7.5 10*3/uL (ref 4.0–10.5)
nRBC: 0 % (ref 0.0–0.2)

## 2022-01-08 LAB — CMP (CANCER CENTER ONLY)
ALT: 9 U/L (ref 0–44)
AST: 14 U/L — ABNORMAL LOW (ref 15–41)
Albumin: 4 g/dL (ref 3.5–5.0)
Alkaline Phosphatase: 74 U/L (ref 38–126)
Anion gap: 10 (ref 5–15)
BUN: 25 mg/dL — ABNORMAL HIGH (ref 8–23)
CO2: 23 mmol/L (ref 22–32)
Calcium: 9.4 mg/dL (ref 8.9–10.3)
Chloride: 106 mmol/L (ref 98–111)
Creatinine: 1.31 mg/dL — ABNORMAL HIGH (ref 0.61–1.24)
GFR, Estimated: 59 mL/min — ABNORMAL LOW (ref >60)
Glucose, Bld: 102 mg/dL — ABNORMAL HIGH (ref 70–99)
Potassium: 3.6 mmol/L (ref 3.5–5.1)
Sodium: 139 mmol/L (ref 135–145)
Total Bilirubin: 0.3 mg/dL (ref 0.3–1.2)
Total Protein: 7.1 g/dL (ref 6.5–8.1)

## 2022-01-08 MED ORDER — PALONOSETRON HCL INJECTION 0.25 MG/5ML
0.2500 mg | Freq: Once | INTRAVENOUS | Status: AC
  Administered 2022-01-08: 10:00:00 0.25 mg via INTRAVENOUS
  Filled 2022-01-08: qty 5

## 2022-01-08 MED ORDER — FAMOTIDINE 20 MG IN NS 100 ML IVPB
20.0000 mg | Freq: Once | INTRAVENOUS | Status: AC
  Administered 2022-01-08: 10:00:00 20 mg via INTRAVENOUS
  Filled 2022-01-08: qty 100

## 2022-01-08 MED ORDER — DIPHENHYDRAMINE HCL 50 MG/ML IJ SOLN
50.0000 mg | Freq: Once | INTRAMUSCULAR | Status: AC
  Administered 2022-01-08: 09:00:00 50 mg via INTRAVENOUS
  Filled 2022-01-08: qty 1

## 2022-01-08 MED ORDER — SODIUM CHLORIDE 0.9 % IV SOLN: Freq: Once | INTRAVENOUS | Status: AC

## 2022-01-08 MED ORDER — SODIUM CHLORIDE 0.9 % IV SOLN
10.0000 mg | Freq: Once | INTRAVENOUS | Status: AC
  Administered 2022-01-08: 10:00:00 10 mg via INTRAVENOUS
  Filled 2022-01-08: qty 10

## 2022-01-08 MED ORDER — SODIUM CHLORIDE 0.9 % IV SOLN
200.0000 mg/m2 | Freq: Once | INTRAVENOUS | Status: AC
  Administered 2022-01-08: 11:00:00 414 mg via INTRAVENOUS
  Filled 2022-01-08: qty 69

## 2022-01-08 MED ORDER — SODIUM CHLORIDE 0.9 % IV SOLN
150.0000 mg | Freq: Once | INTRAVENOUS | Status: AC
  Administered 2022-01-08: 10:00:00 150 mg via INTRAVENOUS
  Filled 2022-01-08: qty 150

## 2022-01-08 MED ORDER — SODIUM CHLORIDE 0.9 % IV SOLN
530.0000 mg | Freq: Once | INTRAVENOUS | Status: AC
  Administered 2022-01-08: 15:00:00 530 mg via INTRAVENOUS
  Filled 2022-01-08: qty 53

## 2022-01-08 NOTE — Patient Instructions (Addendum)
[facility]  Discharge Instructions: Thank you for choosing [facility] to provide your oncology and hematology care.   If you have a lab appointment with the [facility], please go directly to the [facility] and check in at the registration area.   Wear comfortable clothing and clothing appropriate for easy access to any Portacath or PICC line.   We strive to give you quality time with your provider. You may need to reschedule your appointment if you arrive late (15 or more minutes).  Arriving late affects you and other patients whose appointments are after yours.  Also, if you miss three or more appointments without notifying the office, you may be dismissed from the clinic at the providers discretion.      For prescription refill requests, have your pharmacy contact our office and allow 72 hours for refills to be completed.    Today you received the following chemotherapy and/or immunotherapy agents Taxol and Carboplatin      To help prevent nausea and vomiting after your treatment, we encourage you to take your nausea medication as directed.  BELOW ARE SYMPTOMS THAT SHOULD BE REPORTED IMMEDIATELY: *FEVER GREATER THAN 100.4 F (38 C) OR HIGHER *CHILLS OR SWEATING *NAUSEA AND VOMITING THAT IS NOT CONTROLLED WITH YOUR NAUSEA MEDICATION *UNUSUAL SHORTNESS OF BREATH *UNUSUAL BRUISING OR BLEEDING *URINARY PROBLEMS (pain or burning when urinating, or frequent urination) *BOWEL PROBLEMS (unusual diarrhea, constipation, pain near the anus) TENDERNESS IN MOUTH AND THROAT WITH OR WITHOUT PRESENCE OF ULCERS (sore throat, sores in mouth, or a toothache) UNUSUAL RASH, SWELLING OR PAIN  UNUSUAL VAGINAL DISCHARGE OR ITCHING   Items with * indicate a potential emergency and should be followed up as soon as possible or go to the Emergency Department if any problems should occur.  Please show the CHEMOTHERAPY ALERT CARD or IMMUNOTHERAPY ALERT CARD at  check-in to the Emergency Department and triage nurse.  Should you have questions after your visit or need to cancel or reschedule your appointment, please contact [facility]  Dept: [phone]  and follow the prompts.  Office hours are 8:00 a.m. to 4:30 p.m. Monday - Friday. Please note that voicemails left after 4:00 p.m. may not be returned until the following business day.  We are closed weekends and major holidays. You have access to a nurse at all times for urgent questions. Please call the main number to the clinic Dept: [phone] and follow the prompts.   For any non-urgent questions, you may also contact your provider using MyChart. We now offer e-Visits for anyone 54 and older to request care online for non-urgent symptoms. For details visit [email or web address].   Also download the MyChart app! Go to the app store, search "MyChart", open the app, select [facility], and log in with your MyChart username and password.  Due to Covid, a mask is required upon entering the hospital/clinic. If you do not have a mask, one will be given to you upon arrival. For doctor visits, patients may have 1 support person aged 67 or older with them. For treatment visits, patients cannot have anyone with them due to current Covid guidelines and our immunocompromised population.   Paclitaxel injection What is this medication? PACLITAXEL (PAK li TAX el) is a chemotherapy drug. It targets fast dividing cells, like cancer cells, and causes these cells to die. This medicine is used to treat ovarian cancer, breast cancer, lung cancer, Kaposi's sarcoma, and other cancers. This  medicine may be used for other purposes; ask your health care provider or pharmacist if you have questions. COMMON BRAND NAME(S): Onxol, Taxol What should I tell my care team before I take this medication? They need to know if you have any of these conditions: history of irregular heartbeat liver  disease low blood counts, like low white cell, platelet, or red cell counts lung or breathing disease, like asthma tingling of the fingers or toes, or other nerve disorder an unusual or allergic reaction to paclitaxel, alcohol, polyoxyethylated castor oil, other chemotherapy, other medicines, foods, dyes, or preservatives pregnant or trying to get pregnant breast-feeding How should I use this medication? This drug is given as an infusion into a vein. It is administered in a hospital or clinic by a specially trained health care professional. Talk to your pediatrician regarding the use of this medicine in children. Special care may be needed. Overdosage: If you think you have taken too much of this medicine contact a poison control center or emergency room at once. NOTE: This medicine is only for you. Do not share this medicine with others. What if I miss a dose? It is important not to miss your dose. Call your doctor or health care professional if you are unable to keep an appointment. What may interact with this medication? Do not take this medicine with any of the following medications: live virus vaccines This medicine may also interact with the following medications: antiviral medicines for hepatitis, HIV or AIDS certain antibiotics like erythromycin and clarithromycin certain medicines for fungal infections like ketoconazole and itraconazole certain medicines for seizures like carbamazepine, phenobarbital, phenytoin gemfibrozil nefazodone rifampin St. John's wort This list may not describe all possible interactions. Give your health care provider a list of all the medicines, herbs, non-prescription drugs, or dietary supplements you use. Also tell them if you smoke, drink alcohol, or use illegal drugs. Some items may interact with your medicine. What should I watch for while using this medication? Your condition will be monitored carefully while you are receiving this medicine. You  will need important blood work done while you are taking this medicine. This medicine can cause serious allergic reactions. To reduce your risk you will need to take other medicine(s) before treatment with this medicine. If you experience allergic reactions like skin rash, itching or hives, swelling of the face, lips, or tongue, tell your doctor or health care professional right away. In some cases, you may be given additional medicines to help with side effects. Follow all directions for their use. This drug may make you feel generally unwell. This is not uncommon, as chemotherapy can affect healthy cells as well as cancer cells. Report any side effects. Continue your course of treatment even though you feel ill unless your doctor tells you to stop. Call your doctor or health care professional for advice if you get a fever, chills or sore throat, or other symptoms of a cold or flu. Do not treat yourself. This drug decreases your body's ability to fight infections. Try to avoid being around people who are sick. This medicine may increase your risk to bruise or bleed. Call your doctor or health care professional if you notice any unusual bleeding. Be careful brushing and flossing your teeth or using a toothpick because you may get an infection or bleed more easily. If you have any dental work done, tell your dentist you are receiving this medicine. Avoid taking products that contain aspirin, acetaminophen, ibuprofen, naproxen, or ketoprofen unless instructed  by your doctor. These medicines may hide a fever. Do not become pregnant while taking this medicine. Women should inform their doctor if they wish to become pregnant or think they might be pregnant. There is a potential for serious side effects to an unborn child. Talk to your health care professional or pharmacist for more information. Do not breast-feed an infant while taking this medicine. Men are advised not to father a child while receiving this  medicine. This product may contain alcohol. Ask your pharmacist or healthcare provider if this medicine contains alcohol. Be sure to tell all healthcare providers you are taking this medicine. Certain medicines, like metronidazole and disulfiram, can cause an unpleasant reaction when taken with alcohol. The reaction includes flushing, headache, nausea, vomiting, sweating, and increased thirst. The reaction can last from 30 minutes to several hours. What side effects may I notice from receiving this medication? Side effects that you should report to your doctor or health care professional as soon as possible: allergic reactions like skin rash, itching or hives, swelling of the face, lips, or tongue breathing problems changes in vision fast, irregular heartbeat high or low blood pressure mouth sores pain, tingling, numbness in the hands or feet signs of decreased platelets or bleeding - bruising, pinpoint red spots on the skin, black, tarry stools, blood in the urine signs of decreased red blood cells - unusually weak or tired, feeling faint or lightheaded, falls signs of infection - fever or chills, cough, sore throat, pain or difficulty passing urine signs and symptoms of liver injury like dark yellow or brown urine; general ill feeling or flu-like symptoms; light-colored stools; loss of appetite; nausea; right upper belly pain; unusually weak or tired; yellowing of the eyes or skin swelling of the ankles, feet, hands unusually slow heartbeat Side effects that usually do not require medical attention (report to your doctor or health care professional if they continue or are bothersome): diarrhea hair loss loss of appetite muscle or joint pain nausea, vomiting pain, redness, or irritation at site where injected tiredness This list may not describe all possible side effects. Call your doctor for medical advice about side effects. You may report side effects to FDA at 1-800-FDA-1088. Where  should I keep my medication? This drug is given in a hospital or clinic and will not be stored at home. NOTE: This sheet is a summary. It may not cover all possible information. If you have questions about this medicine, talk to your doctor, pharmacist, or health care provider.  2022 Elsevier/Gold Standard (2021-08-21 00:00:00)  Carboplatin injection What is this medication? CARBOPLATIN (KAR boe pla tin) is a chemotherapy drug. It targets fast dividing cells, like cancer cells, and causes these cells to die. This medicine is used to treat ovarian cancer and many other cancers. This medicine may be used for other purposes; ask your health care provider or pharmacist if you have questions. COMMON BRAND NAME(S): Paraplatin What should I tell my care team before I take this medication? They need to know if you have any of these conditions: blood disorders hearing problems kidney disease recent or ongoing radiation therapy an unusual or allergic reaction to carboplatin, cisplatin, other chemotherapy, other medicines, foods, dyes, or preservatives pregnant or trying to get pregnant breast-feeding How should I use this medication? This drug is usually given as an infusion into a vein. It is administered in a hospital or clinic by a specially trained health care professional. Talk to your pediatrician regarding the use of this medicine  in children. Special care may be needed. Overdosage: If you think you have taken too much of this medicine contact a poison control center or emergency room at once. NOTE: This medicine is only for you. Do not share this medicine with others. What if I miss a dose? It is important not to miss a dose. Call your doctor or health care professional if you are unable to keep an appointment. What may interact with this medication? medicines for seizures medicines to increase blood counts like filgrastim, pegfilgrastim, sargramostim some antibiotics like amikacin,  gentamicin, neomycin, streptomycin, tobramycin vaccines Talk to your doctor or health care professional before taking any of these medicines: acetaminophen aspirin ibuprofen ketoprofen naproxen This list may not describe all possible interactions. Give your health care provider a list of all the medicines, herbs, non-prescription drugs, or dietary supplements you use. Also tell them if you smoke, drink alcohol, or use illegal drugs. Some items may interact with your medicine. What should I watch for while using this medication? Your condition will be monitored carefully while you are receiving this medicine. You will need important blood work done while you are taking this medicine. This drug may make you feel generally unwell. This is not uncommon, as chemotherapy can affect healthy cells as well as cancer cells. Report any side effects. Continue your course of treatment even though you feel ill unless your doctor tells you to stop. In some cases, you may be given additional medicines to help with side effects. Follow all directions for their use. Call your doctor or health care professional for advice if you get a fever, chills or sore throat, or other symptoms of a cold or flu. Do not treat yourself. This drug decreases your body's ability to fight infections. Try to avoid being around people who are sick. This medicine may increase your risk to bruise or bleed. Call your doctor or health care professional if you notice any unusual bleeding. Be careful brushing and flossing your teeth or using a toothpick because you may get an infection or bleed more easily. If you have any dental work done, tell your dentist you are receiving this medicine. Avoid taking products that contain aspirin, acetaminophen, ibuprofen, naproxen, or ketoprofen unless instructed by your doctor. These medicines may hide a fever. Do not become pregnant while taking this medicine. Women should inform their doctor if they wish  to become pregnant or think they might be pregnant. There is a potential for serious side effects to an unborn child. Talk to your health care professional or pharmacist for more information. Do not breast-feed an infant while taking this medicine. What side effects may I notice from receiving this medication? Side effects that you should report to your doctor or health care professional as soon as possible: allergic reactions like skin rash, itching or hives, swelling of the face, lips, or tongue signs of infection - fever or chills, cough, sore throat, pain or difficulty passing urine signs of decreased platelets or bleeding - bruising, pinpoint red spots on the skin, black, tarry stools, nosebleeds signs of decreased red blood cells - unusually weak or tired, fainting spells, lightheadedness breathing problems changes in hearing changes in vision chest pain high blood pressure low blood counts - This drug may decrease the number of white blood cells, red blood cells and platelets. You may be at increased risk for infections and bleeding. nausea and vomiting pain, swelling, redness or irritation at the injection site pain, tingling, numbness in the hands or  feet problems with balance, talking, walking trouble passing urine or change in the amount of urine Side effects that usually do not require medical attention (report to your doctor or health care professional if they continue or are bothersome): hair loss loss of appetite metallic taste in the mouth or changes in taste This list may not describe all possible side effects. Call your doctor for medical advice about side effects. You may report side effects to FDA at 1-800-FDA-1088. Where should I keep my medication? This drug is given in a hospital or clinic and will not be stored at home. NOTE: This sheet is a summary. It may not cover all possible information. If you have questions about this medicine, talk to your doctor, pharmacist,  or health care provider.  2022 Elsevier/Gold Standard (2008-05-11 00:00:00)

## 2022-01-10 ENCOUNTER — Encounter: Payer: Self-pay | Admitting: Internal Medicine

## 2022-01-10 ENCOUNTER — Inpatient Hospital Stay: Payer: Medicare Other

## 2022-01-10 ENCOUNTER — Other Ambulatory Visit: Payer: Self-pay

## 2022-01-10 VITALS — BP 151/92 | HR 61 | Temp 98.3°F | Resp 18

## 2022-01-10 DIAGNOSIS — Z5111 Encounter for antineoplastic chemotherapy: Secondary | ICD-10-CM | POA: Diagnosis not present

## 2022-01-10 DIAGNOSIS — C3492 Malignant neoplasm of unspecified part of left bronchus or lung: Secondary | ICD-10-CM

## 2022-01-10 MED ORDER — PEGFILGRASTIM-BMEZ 6 MG/0.6ML ~~LOC~~ SOSY
6.0000 mg | PREFILLED_SYRINGE | Freq: Once | SUBCUTANEOUS | Status: AC
  Administered 2022-01-10: 6 mg via SUBCUTANEOUS
  Filled 2022-01-10: qty 0.6

## 2022-01-10 NOTE — Progress Notes (Signed)
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

## 2022-01-10 NOTE — Patient Instructions (Signed)

## 2022-01-11 ENCOUNTER — Encounter: Payer: Self-pay | Admitting: Internal Medicine

## 2022-01-16 ENCOUNTER — Other Ambulatory Visit: Payer: Self-pay

## 2022-01-16 ENCOUNTER — Encounter: Payer: Self-pay | Admitting: *Deleted

## 2022-01-16 ENCOUNTER — Inpatient Hospital Stay: Payer: Medicare Other | Attending: Internal Medicine

## 2022-01-16 ENCOUNTER — Inpatient Hospital Stay (HOSPITAL_BASED_OUTPATIENT_CLINIC_OR_DEPARTMENT_OTHER): Payer: Medicare Other | Admitting: Internal Medicine

## 2022-01-16 VITALS — BP 148/80 | HR 72 | Temp 97.3°F | Resp 18 | Ht 71.0 in | Wt 193.3 lb

## 2022-01-16 DIAGNOSIS — Z5189 Encounter for other specified aftercare: Secondary | ICD-10-CM | POA: Insufficient documentation

## 2022-01-16 DIAGNOSIS — N289 Disorder of kidney and ureter, unspecified: Secondary | ICD-10-CM | POA: Diagnosis not present

## 2022-01-16 DIAGNOSIS — C3492 Malignant neoplasm of unspecified part of left bronchus or lung: Secondary | ICD-10-CM

## 2022-01-16 DIAGNOSIS — I10 Essential (primary) hypertension: Secondary | ICD-10-CM | POA: Diagnosis not present

## 2022-01-16 DIAGNOSIS — E119 Type 2 diabetes mellitus without complications: Secondary | ICD-10-CM | POA: Insufficient documentation

## 2022-01-16 DIAGNOSIS — Z5111 Encounter for antineoplastic chemotherapy: Secondary | ICD-10-CM | POA: Insufficient documentation

## 2022-01-16 DIAGNOSIS — C3412 Malignant neoplasm of upper lobe, left bronchus or lung: Secondary | ICD-10-CM | POA: Diagnosis present

## 2022-01-16 DIAGNOSIS — J90 Pleural effusion, not elsewhere classified: Secondary | ICD-10-CM | POA: Insufficient documentation

## 2022-01-16 LAB — CMP (CANCER CENTER ONLY)
ALT: 19 U/L (ref 0–44)
AST: 16 U/L (ref 15–41)
Albumin: 4.1 g/dL (ref 3.5–5.0)
Alkaline Phosphatase: 101 U/L (ref 38–126)
Anion gap: 10 (ref 5–15)
BUN: 28 mg/dL — ABNORMAL HIGH (ref 8–23)
CO2: 25 mmol/L (ref 22–32)
Calcium: 9.5 mg/dL (ref 8.9–10.3)
Chloride: 103 mmol/L (ref 98–111)
Creatinine: 1.23 mg/dL (ref 0.61–1.24)
GFR, Estimated: >60 mL/min (ref >60)
Glucose, Bld: 157 mg/dL — ABNORMAL HIGH (ref 70–99)
Potassium: 3.4 mmol/L — ABNORMAL LOW (ref 3.5–5.1)
Sodium: 138 mmol/L (ref 135–145)
Total Bilirubin: 0.3 mg/dL (ref 0.3–1.2)
Total Protein: 7.3 g/dL (ref 6.5–8.1)

## 2022-01-16 LAB — CBC WITH DIFFERENTIAL (CANCER CENTER ONLY)
Abs Immature Granulocytes: 1.06 10*3/uL — ABNORMAL HIGH (ref 0.00–0.07)
Basophils Absolute: 0.1 10*3/uL (ref 0.0–0.1)
Basophils Relative: 1 %
Eosinophils Absolute: 0.3 10*3/uL (ref 0.0–0.5)
Eosinophils Relative: 2 %
HCT: 35.9 % — ABNORMAL LOW (ref 39.0–52.0)
Hemoglobin: 11.7 g/dL — ABNORMAL LOW (ref 13.0–17.0)
Immature Granulocytes: 8 %
Lymphocytes Relative: 30 %
Lymphs Abs: 4 10*3/uL (ref 0.7–4.0)
MCH: 28.3 pg (ref 26.0–34.0)
MCHC: 32.6 g/dL (ref 30.0–36.0)
MCV: 86.9 fL (ref 80.0–100.0)
Monocytes Absolute: 1.8 10*3/uL — ABNORMAL HIGH (ref 0.1–1.0)
Monocytes Relative: 13 %
Neutro Abs: 6.2 10*3/uL (ref 1.7–7.7)
Neutrophils Relative %: 46 %
Platelet Count: 163 10*3/uL (ref 150–400)
RBC: 4.13 MIL/uL — ABNORMAL LOW (ref 4.22–5.81)
RDW: 14.5 % (ref 11.5–15.5)
Smear Review: NORMAL
WBC Count: 13.5 10*3/uL — ABNORMAL HIGH (ref 4.0–10.5)
nRBC: 0.1 % (ref 0.0–0.2)

## 2022-01-16 NOTE — Progress Notes (Signed)
[place] Telephone:(336) [phone]   Fax:([phone]  OFFICE PROGRESS NOTE  Associates, [facility] [place] [address]  DIAGNOSIS: Stage IIIA (T1c, N2, M0) non-small cell lung cancer, moderately differentiated adenocarcinoma diagnosed in December 2022.  PD-L1 expression is 70%.    Molecular study showed no actionable mutation.  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 Kipp Brood on November 15, 2021  CURRENT THERAPY: Adjuvant systemic chemotherapy with carboplatin for AUC of 6 and paclitaxel 200 Mg/M2 with Neulasta support every 3 weeks.  First dose January 08, 2022.  The patient was not eligible for treatment with cisplatin and Alimta because of renal insufficiency.  Status post 1 cycle.  INTERVAL HISTORY: [name] 71 y.o. male returns to the clinic today for follow-up visit accompanied by his wife.  The patient is feeling fine today with no concerning complaints except for fatigue.  He had a lot of flulike symptoms and myalgia after the Neulasta injection.  He took pain medication as well as Claritin with mild improvement.  He denied having any current chest pain, shortness of breath, cough or hemoptysis.  He denied having any fever or chills.  He has no nausea, vomiting, diarrhea or constipation.  He has no headache or visual changes.  He is here today for evaluation and repeat blood work.  MEDICAL HISTORY: Past Medical History:  Diagnosis Date   Diabetes mellitus ([place])    Dyslipidemia    Hypertension     ALLERGIES:  has No Known Allergies.  MEDICATIONS:  Current Outpatient Medications  Medication Sig Dispense Refill   atenolol (TENORMIN) 100 MG tablet Take 100 mg by mouth daily.     atorvastatin (LIPITOR) 20 MG tablet Take 20 mg by mouth daily.      famotidine (PEPCID) 20 MG tablet Take 20 mg by mouth daily.     gabapentin (NEURONTIN) 300 MG capsule Take 1 capsule (300 mg total) by mouth 3 (three) times daily. 90 capsule 2   losartan-hydrochlorothiazide (HYZAAR) 100-12.5 MG tablet Take 1 tablet by mouth daily.     metFORMIN (GLUCOPHAGE-XR) 500 MG 24 hr tablet Take 1,000 mg by mouth every evening.     methocarbamol (ROBAXIN) 500 MG tablet Take 1 tablet (500 mg total) by mouth 3 (three) times daily. 90 tablet 0   oxyCODONE-acetaminophen (PERCOCET) 10-325 MG tablet Take 1 tablet by mouth every 4 (four) hours as needed for pain. 60 tablet 0   prochlorperazine (COMPAZINE) 10 MG tablet Take 1 tablet (10 mg total) by mouth every 6 (six) hours as needed for nausea or vomiting. 30 tablet 0   No current facility-administered medications for this visit.    SURGICAL HISTORY:  Past Surgical History:  Procedure Laterality Date   BRONCHIAL BIOPSY  11/15/2021   Procedure: BRONCHIAL BIOPSIES;  Surgeon: Garner Nash, DO;  Location: [place];  Service: Pulmonary;;   BRONCHIAL BRUSHINGS  11/15/2021   Procedure: BRONCHIAL BRUSHINGS;  Surgeon: Garner Nash, DO;  Location: [place] [facility];  Service: Pulmonary;;   FIDUCIAL MARKER PLACEMENT  11/15/2021   Procedure: FIDUCIAL DYE MARKING;  Surgeon: Garner Nash, DO;  Location: [place] [facility];  Service: Pulmonary;;   INGUINAL HERNIA REPAIR     INTERCOSTAL NERVE BLOCK Left 11/15/2021   Procedure: INTERCOSTAL NERVE BLOCK;  Surgeon: Lajuana Matte, MD;  Location: [place];  Service: Thoracic;  Laterality: Left;   LOBECTOMY Left 11/15/2021   Procedure: LEFT UPPER LOBECTOMY;  Surgeon: Lajuana Matte, MD;  Location: [place];  Service: Thoracic;  Laterality: Left;   MEDIASTINAL EXPLORATION Left 11/15/2021   Procedure: MEDIASTINAL LYMPH NODE EXPLORATION;  Surgeon: Lajuana Matte, MD;  Location: [place];  Service: Thoracic;  Laterality: Left;   NODE DISSECTION Left 11/15/2021   Procedure: NODE DISSECTION;   Surgeon: Lajuana Matte, MD;  Location: [place];  Service: Thoracic;  Laterality: Left;   RIB PLATING Left 11/15/2021   Procedure: RIB PLATING;  Surgeon: Lajuana Matte, MD;  Location: [place];  Service: Thoracic;  Laterality: Left;   THORACOTOMY/LOBECTOMY Left 11/15/2021   Procedure: EMERGENCY THORACOTOMY;  Surgeon: Lajuana Matte, MD;  Location: [place];  Service: Thoracic;  Laterality: Left;   VIDEO BRONCHOSCOPY WITH RADIAL ENDOBRONCHIAL ULTRASOUND  11/15/2021   Procedure: VIDEO BRONCHOSCOPY WITH RADIAL ENDOBRONCHIAL ULTRASOUND;  Surgeon: Garner Nash, DO;  Location: [facility];  Service: Pulmonary;;    REVIEW OF SYSTEMS:  A comprehensive review of systems was negative except for: Constitutional: positive for fatigue Musculoskeletal: positive for arthralgias   PHYSICAL EXAMINATION: General appearance: alert, cooperative, fatigued, and no distress Head: Normocephalic, without obvious abnormality, atraumatic Neck: no adenopathy, no JVD, supple, symmetrical, trachea midline, and thyroid not enlarged, symmetric, no tenderness/mass/nodules Lymph nodes: Cervical, supraclavicular, and axillary nodes normal. Resp: clear to auscultation bilaterally Back: symmetric, no curvature. ROM normal. No CVA tenderness. Cardio: regular rate and rhythm, S1, S2 normal, no murmur, click, rub or gallop GI: soft, non-tender; bowel sounds normal; no masses,  no organomegaly Extremities: extremities normal, atraumatic, no cyanosis or edema  ECOG PERFORMANCE STATUS: 1 - Symptomatic but completely ambulatory  Blood pressure (!) 148/80, pulse 72, temperature (!) 97.3 F (36.3 C), temperature source Tympanic, resp. rate 18, height 5' 11"  (1.803 m), weight 193 lb 4.8 oz (87.7 kg), SpO2 98 %.  LABORATORY DATA: Lab Results  Component Value Date   WBC 13.5 (H) 01/16/2022   HGB 11.7 (L) 01/16/2022   HCT 35.9 (L) 01/16/2022   MCV 86.9 01/16/2022   PLT 163 01/16/2022      Chemistry      Component  Value Date/Time   NA 138 01/16/2022 0935   K 3.4 (L) 01/16/2022 0935   CL 103 01/16/2022 0935   CO2 25 01/16/2022 0935   BUN 28 (H) 01/16/2022 0935   CREATININE 1.23 01/16/2022 0935      Component Value Date/Time   CALCIUM 9.5 01/16/2022 0935   ALKPHOS 101 01/16/2022 0935   AST 16 01/16/2022 0935   ALT 19 01/16/2022 0935   BILITOT 0.3 01/16/2022 0935       RADIOGRAPHIC STUDIES: DG Chest 2 View  Result Date: 01/04/2022 CLINICAL DATA:  Stage III lung cancer. EXAM: CHEST - 2 VIEW COMPARISON:  November 30, 2021 FINDINGS: The heart size and mediastinal contours are within normal limits. The right lung is clear. Stable left upper lobe opacities identified unchanged compared to prior exam. Small left pleural effusion is identified not significantly changed. Postsurgical changes involving left ribs are stable. IMPRESSION: Stable left upper lobe opacities identified unchanged compared to prior exam. Electronically Signed   By: Abelardo Diesel M.D.   On: 01/04/2022 11:22    ASSESSMENT AND PLAN: This is a very pleasant 71 years old white male diagnosed with a stage IIIa (T1c, N2, M0) non-small cell lung cancer, adenocarcinoma presented with left upper lobe lung nodule in addition to  mediastinal lymphadenopathy diagnosed in December 2022.  The patient is status post left upper lobectomy with lymph node dissection.  The resection margins were negative for malignancy and there was no visceral pleural involvement but lymphovascular invasion. The patient has no actionable mutations and PD-L1 expression was 70% I recommended for the patient to proceed with adjuvant systemic chemotherapy for 4 cycles followed by adjuvant treatment with immunotherapy with atezolizumab.  Initially will consider The patient for treatment with cisplatin and Alimta but because of his renal insufficiency, will change the adjuvant systemic chemotherapy to carboplatin for AUC of 6 and paclitaxel 200 Mg/M2 every 3 weeks with Neulasta  support. He is status post 1 cycle.  He tolerated the first cycle of his treatment well except for significant fatigue and myalgia after the Neulasta injection. His lab work is unremarkable today for any concerning abnormalities. I recommended for him to continue his treatment as planned and he is expected to start cycle #2 in 2 weeks. For the pain after the Neulasta injection, he was advised to take his pain medication in addition to Claritin for a few days after the injection For the left-sided chest pain, he will continue his current treatment with Percocet on a as needed basis as recommended by Dr. Kipp Brood. The patient was advised to call immediately if he has any other concerning symptoms in the interval. The patient voices understanding of current disease status and treatment options and is in agreement with the current care plan.  All questions were answered. The patient knows to call the clinic with any problems, questions or concerns. We can certainly see the patient much sooner if necessary.   Disclaimer: This note was dictated with voice recognition software. Similar sounding words can inadvertently be transcribed and may not be corrected upon review.

## 2022-01-16 NOTE — Progress Notes (Signed)
Oncology Nurse Navigator Documentation  Oncology Nurse Navigator Flowsheets 01/16/2022 01/01/2022 12/12/2021 11/29/2021  Abnormal Finding Date - - 08/17/2021 -  Confirmed Diagnosis Date - - 11/15/2021 -  Diagnosis Status - - Pending Molecular Studies -  Planned Course of Treatment - Chemotherapy Surgery;Targeted Therapy -  Phase of Treatment - Chemo - -  Surgery Actual Start Date: - - 11/15/2021 -  Navigator Follow Up Date: 02/06/2022 01/04/2022 01/01/2022 12/11/2021  Navigator Follow Up Reason: Follow-up Appointment Appointment Review Follow-up Appointment Appointment Review  Navigator Location [facility]  Navigator Encounter Type Clinic/MDC Clinic/MDC Pathology Review Pathology Review  Treatment Initiated Date - - 11/15/2021 -  Patient Visit Type MedOnc;Follow-up MedOnc;Follow-up Other Other  Treatment Phase Treatment Pre-Tx/Tx Discussion Other Other  Barriers/Navigation Needs Education/helped to explain GF and side effects.  Education Coordination of Care Coordination of Care  Education Other Other - -  Interventions Education;Psycho-Social Support Coordination of Care;Psycho-Social Support Coordination of Care Coordination of Care  Acuity Level 3-Moderate Needs (3-4 Barriers Identified) Level 3-Moderate Needs (3-4 Barriers Identified) Level 2-Minimal Needs (1-2 Barriers Identified) Level 2-Minimal Needs (1-2 Barriers Identified)  Coordination of Care - - Pathology Pathology  Education Method Verbal - - -  Time Spent with Patient 67 [phone] 01

## 2022-01-23 ENCOUNTER — Inpatient Hospital Stay: Payer: Medicare Other

## 2022-01-23 ENCOUNTER — Other Ambulatory Visit: Payer: Self-pay

## 2022-01-23 DIAGNOSIS — C3492 Malignant neoplasm of unspecified part of left bronchus or lung: Secondary | ICD-10-CM

## 2022-01-23 DIAGNOSIS — Z5111 Encounter for antineoplastic chemotherapy: Secondary | ICD-10-CM | POA: Diagnosis not present

## 2022-01-23 LAB — CBC WITH DIFFERENTIAL (CANCER CENTER ONLY)
Abs Immature Granulocytes: 0.1 10*3/uL — ABNORMAL HIGH (ref 0.00–0.07)
Basophils Absolute: 0.1 10*3/uL (ref 0.0–0.1)
Basophils Relative: 1 %
Eosinophils Absolute: 0.3 10*3/uL (ref 0.0–0.5)
Eosinophils Relative: 3 %
HCT: 36.1 % — ABNORMAL LOW (ref 39.0–52.0)
Hemoglobin: 11.5 g/dL — ABNORMAL LOW (ref 13.0–17.0)
Immature Granulocytes: 1 %
Lymphocytes Relative: 31 %
Lymphs Abs: 3 10*3/uL (ref 0.7–4.0)
MCH: 28.2 pg (ref 26.0–34.0)
MCHC: 31.9 g/dL (ref 30.0–36.0)
MCV: 88.5 fL (ref 80.0–100.0)
Monocytes Absolute: 1 10*3/uL (ref 0.1–1.0)
Monocytes Relative: 10 %
Neutro Abs: 5.3 10*3/uL (ref 1.7–7.7)
Neutrophils Relative %: 54 %
Platelet Count: 128 10*3/uL — ABNORMAL LOW (ref 150–400)
RBC: 4.08 MIL/uL — ABNORMAL LOW (ref 4.22–5.81)
RDW: 15.1 % (ref 11.5–15.5)
WBC Count: 9.7 10*3/uL (ref 4.0–10.5)
nRBC: 0 % (ref 0.0–0.2)

## 2022-01-23 LAB — CMP (CANCER CENTER ONLY)
ALT: 13 U/L (ref 0–44)
AST: 17 U/L (ref 15–41)
Albumin: 4.1 g/dL (ref 3.5–5.0)
Alkaline Phosphatase: 98 U/L (ref 38–126)
Anion gap: 10 (ref 5–15)
BUN: 20 mg/dL (ref 8–23)
CO2: 25 mmol/L (ref 22–32)
Calcium: 8.9 mg/dL (ref 8.9–10.3)
Chloride: 106 mmol/L (ref 98–111)
Creatinine: 1.69 mg/dL — ABNORMAL HIGH (ref 0.61–1.24)
GFR, Estimated: 43 mL/min — ABNORMAL LOW (ref >60)
Glucose, Bld: 152 mg/dL — ABNORMAL HIGH (ref 70–99)
Potassium: 3.3 mmol/L — ABNORMAL LOW (ref 3.5–5.1)
Sodium: 141 mmol/L (ref 135–145)
Total Bilirubin: 0.4 mg/dL (ref 0.3–1.2)
Total Protein: 7.4 g/dL (ref 6.5–8.1)

## 2022-01-29 ENCOUNTER — Inpatient Hospital Stay: Payer: Medicare Other

## 2022-01-29 ENCOUNTER — Inpatient Hospital Stay (HOSPITAL_BASED_OUTPATIENT_CLINIC_OR_DEPARTMENT_OTHER): Payer: Medicare Other | Admitting: Internal Medicine

## 2022-01-29 ENCOUNTER — Encounter: Payer: Self-pay | Admitting: *Deleted

## 2022-01-29 VITALS — BP 159/92 | HR 67 | Temp 98.1°F | Resp 20 | Ht 71.0 in | Wt 197.3 lb

## 2022-01-29 DIAGNOSIS — C3492 Malignant neoplasm of unspecified part of left bronchus or lung: Secondary | ICD-10-CM

## 2022-01-29 DIAGNOSIS — L659 Nonscarring hair loss, unspecified: Secondary | ICD-10-CM | POA: Diagnosis not present

## 2022-01-29 DIAGNOSIS — E119 Type 2 diabetes mellitus without complications: Secondary | ICD-10-CM | POA: Diagnosis not present

## 2022-01-29 DIAGNOSIS — Z5111 Encounter for antineoplastic chemotherapy: Secondary | ICD-10-CM | POA: Diagnosis not present

## 2022-01-29 DIAGNOSIS — C3412 Malignant neoplasm of upper lobe, left bronchus or lung: Secondary | ICD-10-CM

## 2022-01-29 DIAGNOSIS — I10 Essential (primary) hypertension: Secondary | ICD-10-CM

## 2022-01-29 LAB — CBC WITH DIFFERENTIAL (CANCER CENTER ONLY)
Abs Immature Granulocytes: 0.03 10*3/uL (ref 0.00–0.07)
Basophils Absolute: 0.1 10*3/uL (ref 0.0–0.1)
Basophils Relative: 1 %
Eosinophils Absolute: 0.1 10*3/uL (ref 0.0–0.5)
Eosinophils Relative: 1 %
HCT: 36.6 % — ABNORMAL LOW (ref 39.0–52.0)
Hemoglobin: 11.9 g/dL — ABNORMAL LOW (ref 13.0–17.0)
Immature Granulocytes: 0 %
Lymphocytes Relative: 32 %
Lymphs Abs: 2.7 10*3/uL (ref 0.7–4.0)
MCH: 28.6 pg (ref 26.0–34.0)
MCHC: 32.5 g/dL (ref 30.0–36.0)
MCV: 88 fL (ref 80.0–100.0)
Monocytes Absolute: 1 10*3/uL (ref 0.1–1.0)
Monocytes Relative: 12 %
Neutro Abs: 4.5 10*3/uL (ref 1.7–7.7)
Neutrophils Relative %: 54 %
Platelet Count: 214 10*3/uL (ref 150–400)
RBC: 4.16 MIL/uL — ABNORMAL LOW (ref 4.22–5.81)
RDW: 15.6 % — ABNORMAL HIGH (ref 11.5–15.5)
WBC Count: 8.5 10*3/uL (ref 4.0–10.5)
nRBC: 0 % (ref 0.0–0.2)

## 2022-01-29 LAB — CMP (CANCER CENTER ONLY)
ALT: 12 U/L (ref 10–47)
AST: 15 U/L (ref 11–38)
Albumin: 4.1 g/dL (ref 3.5–5.0)
Alkaline Phosphatase: 96 U/L (ref 38–126)
Anion gap: 9 (ref 5–15)
BUN: 25 mg/dL — ABNORMAL HIGH (ref 8–23)
CO2: 21 mmol/L — ABNORMAL LOW (ref 22–32)
Calcium: 9.4 mg/dL (ref 8.9–10.3)
Chloride: 109 mmol/L (ref 98–111)
Creatinine: 1.15 mg/dL (ref 0.60–1.20)
GFR, Estimated: >60 mL/min (ref >60)
Glucose, Bld: 159 mg/dL — ABNORMAL HIGH (ref 70–99)
Potassium: 3.9 mmol/L (ref 3.5–5.1)
Sodium: 139 mmol/L (ref 135–145)
Total Bilirubin: 0.4 mg/dL (ref 0.2–1.6)
Total Protein: 7.6 g/dL (ref 6.5–8.1)

## 2022-01-29 MED ORDER — SODIUM CHLORIDE 0.9 % IV SOLN
150.0000 mg | Freq: Once | INTRAVENOUS | Status: AC
  Administered 2022-01-29: 150 mg via INTRAVENOUS
  Filled 2022-01-29: qty 150

## 2022-01-29 MED ORDER — SODIUM CHLORIDE 0.9 % IV SOLN
10.0000 mg | Freq: Once | INTRAVENOUS | Status: AC
  Administered 2022-01-29: 10 mg via INTRAVENOUS
  Filled 2022-01-29: qty 10

## 2022-01-29 MED ORDER — SODIUM CHLORIDE 0.9 % IV SOLN
200.0000 mg/m2 | Freq: Once | INTRAVENOUS | Status: AC
  Administered 2022-01-29: 414 mg via INTRAVENOUS
  Filled 2022-01-29: qty 69

## 2022-01-29 MED ORDER — FAMOTIDINE IN NACL 20-0.9 MG/50ML-% IV SOLN
20.0000 mg | Freq: Once | INTRAVENOUS | Status: AC
  Administered 2022-01-29: 20 mg via INTRAVENOUS
  Filled 2022-01-29: qty 50

## 2022-01-29 MED ORDER — SODIUM CHLORIDE 0.9 % IV SOLN
530.0000 mg | Freq: Once | INTRAVENOUS | Status: AC
  Administered 2022-01-29: 530 mg via INTRAVENOUS
  Filled 2022-01-29: qty 53

## 2022-01-29 MED ORDER — PALONOSETRON HCL INJECTION 0.25 MG/5ML
0.2500 mg | Freq: Once | INTRAVENOUS | Status: AC
  Administered 2022-01-29: 0.25 mg via INTRAVENOUS
  Filled 2022-01-29: qty 5

## 2022-01-29 MED ORDER — SODIUM CHLORIDE 0.9 % IV SOLN: Freq: Once | INTRAVENOUS | Status: AC

## 2022-01-29 MED ORDER — DIPHENHYDRAMINE HCL 50 MG/ML IJ SOLN
50.0000 mg | Freq: Once | INTRAMUSCULAR | Status: AC
  Administered 2022-01-29: 50 mg via INTRAVENOUS
  Filled 2022-01-29: qty 1

## 2022-01-29 NOTE — Progress Notes (Signed)
[place] [phone]) [phone]   Fax:(336) [phone]  OFFICE PROGRESS NOTE  Associates, [facility] [place] [address]  DIAGNOSIS: Stage IIIA (T1c, N2, M0) non-small cell lung cancer, moderately differentiated adenocarcinoma diagnosed in December 2022.  PD-L1 expression is 70%.    Molecular study showed no actionable mutation.  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 Kipp Brood on November 15, 2021  CURRENT THERAPY: Adjuvant systemic chemotherapy with carboplatin for AUC of 6 and paclitaxel 200 Mg/M2 with Neulasta support every 3 weeks.  First dose January 08, 2022.  The patient was not eligible for treatment with cisplatin and Alimta because of renal insufficiency.  Status post 1 cycle.  INTERVAL HISTORY: [name] 71 y.o. male returns to the clinic today for follow-up visit.  The patient is feeling fine today with no concerning complaints except for mild fatigue and alopecia.  He is interested in having a wig.  He denied having any current chest pain, shortness of breath, cough or hemoptysis.  He denied having any fever or chills.  He has no nausea, vomiting, diarrhea or constipation.  He has no headache or visual changes.  He is here today for evaluation before starting cycle #2 of his treatment.  MEDICAL HISTORY: Past Medical History:  Diagnosis Date   Diabetes mellitus ([place])    Dyslipidemia    Hypertension     ALLERGIES:  has No Known Allergies.  MEDICATIONS:  Current Outpatient Medications  Medication Sig Dispense Refill   atenolol (TENORMIN) 100 MG tablet Take 100 mg by mouth daily.     atorvastatin (LIPITOR) 20 MG tablet Take 20 mg by mouth daily.     famotidine (PEPCID) 20 MG tablet Take 20 mg by mouth daily.     gabapentin (NEURONTIN) 300 MG capsule  Take 1 capsule (300 mg total) by mouth 3 (three) times daily. 90 capsule 2   losartan-hydrochlorothiazide (HYZAAR) 100-12.5 MG tablet Take 1 tablet by mouth daily.     metFORMIN (GLUCOPHAGE-XR) 500 MG 24 hr tablet Take 1,000 mg by mouth every evening.     methocarbamol (ROBAXIN) 500 MG tablet Take 1 tablet (500 mg total) by mouth 3 (three) times daily. 90 tablet 0   oxyCODONE-acetaminophen (PERCOCET) 10-325 MG tablet Take 1 tablet by mouth every 4 (four) hours as needed for pain. 60 tablet 0   prochlorperazine (COMPAZINE) 10 MG tablet Take 1 tablet (10 mg total) by mouth every 6 (six) hours as needed for nausea or vomiting. 30 tablet 0   No current facility-administered medications for this visit.    SURGICAL HISTORY:  Past Surgical History:  Procedure Laterality Date   BRONCHIAL BIOPSY  11/15/2021   Procedure: BRONCHIAL BIOPSIES;  Surgeon: Garner Nash, DO;  Location: [place] [facility];  Service: Pulmonary;;   BRONCHIAL BRUSHINGS  11/15/2021   Procedure: BRONCHIAL BRUSHINGS;  Surgeon: Garner Nash, DO;  Location: [place] [facility];  Service: Pulmonary;;   FIDUCIAL MARKER PLACEMENT  11/15/2021   Procedure: FIDUCIAL DYE MARKING;  Surgeon: Garner Nash, DO;  Location: [place] [facility];  Service: Pulmonary;;   INGUINAL HERNIA REPAIR     INTERCOSTAL NERVE BLOCK Left 11/15/2021   Procedure: INTERCOSTAL NERVE BLOCK;  Surgeon: Lajuana Matte, MD;  Location: [place];  Service: Thoracic;  Laterality: Left;   LOBECTOMY Left 11/15/2021   Procedure: LEFT UPPER  LOBECTOMY;  Surgeon: Lajuana Matte, MD;  Location: [place];  Service: Thoracic;  Laterality: Left;   MEDIASTINAL EXPLORATION Left 11/15/2021   Procedure: MEDIASTINAL LYMPH NODE EXPLORATION;  Surgeon: Lajuana Matte, MD;  Location: [place];  Service: Thoracic;  Laterality: Left;   NODE DISSECTION Left 11/15/2021   Procedure: NODE DISSECTION;  Surgeon: Lajuana Matte, MD;  Location: [place];  Service: Thoracic;  Laterality: Left;   RIB  PLATING Left 11/15/2021   Procedure: RIB PLATING;  Surgeon: Lajuana Matte, MD;  Location: [place];  Service: Thoracic;  Laterality: Left;   THORACOTOMY/LOBECTOMY Left 11/15/2021   Procedure: EMERGENCY THORACOTOMY;  Surgeon: Lajuana Matte, MD;  Location: [place];  Service: Thoracic;  Laterality: Left;   VIDEO BRONCHOSCOPY WITH RADIAL ENDOBRONCHIAL ULTRASOUND  11/15/2021   Procedure: VIDEO BRONCHOSCOPY WITH RADIAL ENDOBRONCHIAL ULTRASOUND;  Surgeon: Garner Nash, DO;  Location: [facility];  Service: Pulmonary;;    REVIEW OF SYSTEMS:  A comprehensive review of systems was negative except for: Constitutional: positive for fatigue Musculoskeletal: positive for arthralgias   PHYSICAL EXAMINATION: General appearance: alert, cooperative, fatigued, and no distress Head: Normocephalic, without obvious abnormality, atraumatic Neck: no adenopathy, no JVD, supple, symmetrical, trachea midline, and thyroid not enlarged, symmetric, no tenderness/mass/nodules Lymph nodes: Cervical, supraclavicular, and axillary nodes normal. Resp: clear to auscultation bilaterally Back: symmetric, no curvature. ROM normal. No CVA tenderness. Cardio: regular rate and rhythm, S1, S2 normal, no murmur, click, rub or gallop GI: soft, non-tender; bowel sounds normal; no masses,  no organomegaly Extremities: extremities normal, atraumatic, no cyanosis or edema  ECOG PERFORMANCE STATUS: 1 - Symptomatic but completely ambulatory  Blood pressure (!) 159/92, pulse 67, temperature 98.1 F (36.7 C), temperature source Tympanic, resp. rate 20, height _0  (1.803 m), weight 197 lb 4.8 oz (89.5 kg), SpO2 100 %.  LABORATORY DATA: Lab Results  Component Value Date   WBC 8.5 01/29/2022   HGB 11.9 (L) 01/29/2022   HCT 36.6 (L) 01/29/2022   MCV 88.0 01/29/2022   PLT 214 01/29/2022      Chemistry      Component Value Date/Time   NA 141 01/23/2022 0919   K 3.3 (L) 01/23/2022 0919   CL 106 01/23/2022 0919   CO2 25  01/23/2022 0919   BUN 20 01/23/2022 0919   CREATININE 1.69 (H) 01/23/2022 0919      Component Value Date/Time   CALCIUM 8.9 01/23/2022 0919   ALKPHOS 98 01/23/2022 0919   AST 17 01/23/2022 0919   ALT 13 01/23/2022 0919   BILITOT 0.4 01/23/2022 0919       RADIOGRAPHIC STUDIES: DG Chest 2 View  Result Date: 01/04/2022 CLINICAL DATA:  Stage III lung cancer. EXAM: CHEST - 2 VIEW COMPARISON:  November 30, 2021 FINDINGS: The heart size and mediastinal contours are within normal limits. The right lung is clear. Stable left upper lobe opacities identified unchanged compared to prior exam. Small left pleural effusion is identified not significantly changed. Postsurgical changes involving left ribs are stable. IMPRESSION: Stable left upper lobe opacities identified unchanged compared to prior exam. Electronically Signed   By: Abelardo Diesel M.D.   On: 01/04/2022 11:22    ASSESSMENT AND PLAN: This is a very pleasant 71 years old white male diagnosed with a stage IIIa (T1c, N2, M0) non-small cell lung cancer, adenocarcinoma presented with left upper lobe lung nodule in addition to mediastinal lymphadenopathy diagnosed in December 2022.  The patient is status post left upper lobectomy with lymph  node dissection.  The resection margins were negative for malignancy and there was no visceral pleural involvement but lymphovascular invasion. The patient has no actionable mutations and PD-L1 expression was 70% I recommended for the patient to proceed with adjuvant systemic chemotherapy for 4 cycles followed by adjuvant treatment with immunotherapy with atezolizumab.  Initially will consider The patient for treatment with cisplatin and Alimta but because of his renal insufficiency, will change the adjuvant systemic chemotherapy to carboplatin for AUC of 6 and paclitaxel 200 Mg/M2 every 3 weeks with Neulasta support. He is status post 1 cycle.  The patient tolerated the first cycle of his treatment well except for  the aching pain and fatigue after the Neulasta injection. I recommended for him to proceed with cycle #2 today as planned. For the alopecia, we will give the patient prescription for cranial prosthesis as a medical necessity. I will see him back for follow-up visit in 3 weeks for evaluation before the next cycle of his treatment. For the pain after the Neulasta injection, he was advised to take his pain medication in addition to Claritin for a few days after the injection For the left-sided chest pain, he will continue his current treatment with Percocet on a as needed basis as recommended by Dr. Kipp Brood. The patient was advised to call immediately if he has any other concerning symptoms in the interval. The patient voices understanding of current disease status and treatment options and is in agreement with the current care plan.  All questions were answered. The patient knows to call the clinic with any problems, questions or concerns. We can certainly see the patient much sooner if necessary.   Disclaimer: This note was dictated with voice recognition software. Similar sounding words can inadvertently be transcribed and may not be corrected upon review.

## 2022-01-29 NOTE — Patient Instructions (Signed)
[facility] ONCOLOGY  Discharge Instructions: Thank you for choosing [facility] to provide your oncology and hematology care.   If you have a lab appointment with the [facility], please go directly to the [facility] and check in at the registration area.   Wear comfortable clothing and clothing appropriate for easy access to any Portacath or PICC line.   We strive to give you quality time with your provider. You may need to reschedule your appointment if you arrive late (15 or more minutes).  Arriving late affects you and other patients whose appointments are after yours.  Also, if you miss three or more appointments without notifying the office, you may be dismissed from the clinic at the providers discretion.      For prescription refill requests, have your pharmacy contact our office and allow 72 hours for refills to be completed.    Today you received the following chemotherapy and/or immunotherapy agents: Carboplatin, Paclitaxel.       To help prevent nausea and vomiting after your treatment, we encourage you to take your nausea medication as directed.  BELOW ARE SYMPTOMS THAT SHOULD BE REPORTED IMMEDIATELY: *FEVER GREATER THAN 100.4 F (38 C) OR HIGHER *CHILLS OR SWEATING *NAUSEA AND VOMITING THAT IS NOT CONTROLLED WITH YOUR NAUSEA MEDICATION *UNUSUAL SHORTNESS OF BREATH *UNUSUAL BRUISING OR BLEEDING *URINARY PROBLEMS (pain or burning when urinating, or frequent urination) *BOWEL PROBLEMS (unusual diarrhea, constipation, pain near the anus) TENDERNESS IN MOUTH AND THROAT WITH OR WITHOUT PRESENCE OF ULCERS (sore throat, sores in mouth, or a toothache) UNUSUAL RASH, SWELLING OR PAIN  UNUSUAL VAGINAL DISCHARGE OR ITCHING   Items with * indicate a potential emergency and should be followed up as soon as possible or go to the Emergency Department if any problems should occur.  Please show the CHEMOTHERAPY ALERT CARD or IMMUNOTHERAPY ALERT CARD  at check-in to the Emergency Department and triage nurse.  Should you have questions after your visit or need to cancel or reschedule your appointment, please contact [facility]  Dept: [phone]  and follow the prompts.  Office hours are 8:00 a.m. to 4:30 p.m. Monday - Friday. Please note that voicemails left after 4:00 p.m. may not be returned until the following business day.  We are closed weekends and major holidays. You have access to a nurse at all times for urgent questions. Please call the main number to the clinic Dept: [phone] and follow the prompts.   For any non-urgent questions, you may also contact your provider using MyChart. We now offer e-Visits for anyone 13 and older to request care online for non-urgent symptoms. For details visit [email or web address].   Also download the MyChart app! Go to the app store, search "MyChart", open the app, select [facility], and log in with your MyChart username and password.  Due to Covid, a mask is required upon entering the hospital/clinic. If you do not have a mask, one will be given to you upon arrival. For doctor visits, patients may have 1 support person aged 29 or older with them. For treatment visits, patients cannot have anyone with them due to current Covid guidelines and our immunocompromised population.

## 2022-01-29 NOTE — Progress Notes (Signed)
Spoke w/ Dr. Julien Nordmann and he would like continue Carboplatin dose of 530 mg  Larene Beach, PharmD

## 2022-01-29 NOTE — Progress Notes (Signed)
Oncology Nurse Navigator Documentation  Oncology Nurse Navigator Flowsheets 01/29/2022 01/16/2022 01/01/2022 12/12/2021 11/29/2021  Abnormal Finding Date - - - 08/17/2021 -  Confirmed Diagnosis Date - - - 11/15/2021 -  Diagnosis Status - - - Pending Molecular Studies -  Planned Course of Treatment - - Chemotherapy Surgery;Targeted Therapy -  Phase of Treatment - - Chemo - -  Surgery Actual Start Date: - - - 11/15/2021 -  Navigator Follow Up Date: - 02/06/2022 01/04/2022 01/01/2022 12/11/2021  Navigator Follow Up Reason: - Follow-up Appointment Appointment Review Follow-up Appointment Appointment Review  Navigator Location [facility]  Navigator Encounter Type Diagnostic Results;Follow-up Appt Clinic/MDC Clinic/MDC Pathology Review Pathology Review  Treatment Initiated Date - - - 11/15/2021 -  Patient Visit Type  Check;Follow-up MedOnc;Follow-up MedOnc;Follow-up Other Other  Treatment Phase Treatment Treatment Pre-Tx/Tx Discussion Other Other  Barriers/Navigation Needs Education/I spoke with [name].  He is very nice and doing well with treatment. He needed information on how to get a wig. I gave him some information of local wig shops. He was thankful for the help.  Education Sales executive of Care  Education Other Other Other - -  Interventions Education;Psycho-Social Support;Surgical Clearance Education;Psycho-Social Support Coordination of Care;Psycho-Social Support Coordination of Care Coordination of Care  Acuity Level 2-Minimal Needs (1-2 Barriers Identified) Level 3-Moderate Needs (3-4 Barriers Identified) Level 3-Moderate Needs (3-4 Barriers Identified) Level 2-Minimal Needs (1-2 Barriers Identified) Level 2-Minimal Needs (1-2 Barriers Identified)  Coordination of Care - - - Pathology Pathology  Education Method Verbal;Other Verbal - - -  Time Spent with Patient 30 30 30  45 30

## 2022-01-31 ENCOUNTER — Other Ambulatory Visit: Payer: Self-pay

## 2022-01-31 ENCOUNTER — Inpatient Hospital Stay: Payer: Medicare Other

## 2022-01-31 VITALS — BP 146/78 | HR 78 | Temp 98.0°F | Resp 18

## 2022-01-31 DIAGNOSIS — Z5111 Encounter for antineoplastic chemotherapy: Secondary | ICD-10-CM | POA: Diagnosis not present

## 2022-01-31 DIAGNOSIS — C3492 Malignant neoplasm of unspecified part of left bronchus or lung: Secondary | ICD-10-CM

## 2022-01-31 MED ORDER — PEGFILGRASTIM-BMEZ 6 MG/0.6ML ~~LOC~~ SOSY
6.0000 mg | PREFILLED_SYRINGE | Freq: Once | SUBCUTANEOUS | Status: AC
  Administered 2022-01-31: 6 mg via SUBCUTANEOUS
  Filled 2022-01-31: qty 0.6

## 2022-02-05 ENCOUNTER — Other Ambulatory Visit: Payer: Self-pay

## 2022-02-05 ENCOUNTER — Inpatient Hospital Stay: Payer: Medicare Other

## 2022-02-05 DIAGNOSIS — C3492 Malignant neoplasm of unspecified part of left bronchus or lung: Secondary | ICD-10-CM

## 2022-02-05 DIAGNOSIS — Z5111 Encounter for antineoplastic chemotherapy: Secondary | ICD-10-CM | POA: Diagnosis not present

## 2022-02-05 LAB — CBC WITH DIFFERENTIAL (CANCER CENTER ONLY)
Abs Immature Granulocytes: 0.05 10*3/uL (ref 0.00–0.07)
Basophils Absolute: 0.2 10*3/uL — ABNORMAL HIGH (ref 0.0–0.1)
Basophils Relative: 1 %
Eosinophils Absolute: 0.5 10*3/uL (ref 0.0–0.5)
Eosinophils Relative: 4 %
HCT: 35.8 % — ABNORMAL LOW (ref 39.0–52.0)
Hemoglobin: 11.7 g/dL — ABNORMAL LOW (ref 13.0–17.0)
Immature Granulocytes: 0 %
Lymphocytes Relative: 25 %
Lymphs Abs: 3.5 10*3/uL (ref 0.7–4.0)
MCH: 28.7 pg (ref 26.0–34.0)
MCHC: 32.7 g/dL (ref 30.0–36.0)
MCV: 87.7 fL (ref 80.0–100.0)
Monocytes Absolute: 1.6 10*3/uL — ABNORMAL HIGH (ref 0.1–1.0)
Monocytes Relative: 12 %
Neutro Abs: 8.2 10*3/uL — ABNORMAL HIGH (ref 1.7–7.7)
Neutrophils Relative %: 58 %
Platelet Count: 166 10*3/uL (ref 150–400)
RBC: 4.08 MIL/uL — ABNORMAL LOW (ref 4.22–5.81)
RDW: 15.9 % — ABNORMAL HIGH (ref 11.5–15.5)
Smear Review: NORMAL
WBC Count: 14 10*3/uL — ABNORMAL HIGH (ref 4.0–10.5)
nRBC: 0 % (ref 0.0–0.2)

## 2022-02-05 LAB — CMP (CANCER CENTER ONLY)
ALT: 18 U/L (ref 0–44)
AST: 15 U/L (ref 15–41)
Albumin: 4.3 g/dL (ref 3.5–5.0)
Alkaline Phosphatase: 114 U/L (ref 38–126)
Anion gap: 8 (ref 5–15)
BUN: 29 mg/dL — ABNORMAL HIGH (ref 8–23)
CO2: 25 mmol/L (ref 22–32)
Calcium: 9.6 mg/dL (ref 8.9–10.3)
Chloride: 105 mmol/L (ref 98–111)
Creatinine: 1.3 mg/dL — ABNORMAL HIGH (ref 0.61–1.24)
GFR, Estimated: 59 mL/min — ABNORMAL LOW (ref >60)
Glucose, Bld: 142 mg/dL — ABNORMAL HIGH (ref 70–99)
Potassium: 3.5 mmol/L (ref 3.5–5.1)
Sodium: 138 mmol/L (ref 135–145)
Total Bilirubin: 0.3 mg/dL (ref 0.3–1.2)
Total Protein: 7.7 g/dL (ref 6.5–8.1)

## 2022-02-12 ENCOUNTER — Inpatient Hospital Stay: Payer: Medicare Other

## 2022-02-12 ENCOUNTER — Other Ambulatory Visit: Payer: Self-pay

## 2022-02-12 DIAGNOSIS — Z5111 Encounter for antineoplastic chemotherapy: Secondary | ICD-10-CM | POA: Diagnosis not present

## 2022-02-12 DIAGNOSIS — C3492 Malignant neoplasm of unspecified part of left bronchus or lung: Secondary | ICD-10-CM

## 2022-02-12 LAB — CMP (CANCER CENTER ONLY)
ALT: 16 U/L (ref 0–44)
AST: 17 U/L (ref 15–41)
Albumin: 4.1 g/dL (ref 3.5–5.0)
Alkaline Phosphatase: 107 U/L (ref 38–126)
Anion gap: 10 (ref 5–15)
BUN: 26 mg/dL — ABNORMAL HIGH (ref 8–23)
CO2: 25 mmol/L (ref 22–32)
Calcium: 8.8 mg/dL — ABNORMAL LOW (ref 8.9–10.3)
Chloride: 105 mmol/L (ref 98–111)
Creatinine: 1.31 mg/dL — ABNORMAL HIGH (ref 0.61–1.24)
GFR, Estimated: 59 mL/min — ABNORMAL LOW (ref >60)
Glucose, Bld: 130 mg/dL — ABNORMAL HIGH (ref 70–99)
Potassium: 3.7 mmol/L (ref 3.5–5.1)
Sodium: 140 mmol/L (ref 135–145)
Total Bilirubin: 0.4 mg/dL (ref 0.3–1.2)
Total Protein: 7.5 g/dL (ref 6.5–8.1)

## 2022-02-12 LAB — CBC WITH DIFFERENTIAL (CANCER CENTER ONLY)
Abs Immature Granulocytes: 0.07 10*3/uL (ref 0.00–0.07)
Basophils Absolute: 0.1 10*3/uL (ref 0.0–0.1)
Basophils Relative: 1 %
Eosinophils Absolute: 0.3 10*3/uL (ref 0.0–0.5)
Eosinophils Relative: 3 %
HCT: 34.9 % — ABNORMAL LOW (ref 39.0–52.0)
Hemoglobin: 11.2 g/dL — ABNORMAL LOW (ref 13.0–17.0)
Immature Granulocytes: 1 %
Lymphocytes Relative: 32 %
Lymphs Abs: 3.2 10*3/uL (ref 0.7–4.0)
MCH: 28.5 pg (ref 26.0–34.0)
MCHC: 32.1 g/dL (ref 30.0–36.0)
MCV: 88.8 fL (ref 80.0–100.0)
Monocytes Absolute: 1 10*3/uL (ref 0.1–1.0)
Monocytes Relative: 10 %
Neutro Abs: 5.5 10*3/uL (ref 1.7–7.7)
Neutrophils Relative %: 53 %
Platelet Count: 146 10*3/uL — ABNORMAL LOW (ref 150–400)
RBC: 3.93 MIL/uL — ABNORMAL LOW (ref 4.22–5.81)
RDW: 16.5 % — ABNORMAL HIGH (ref 11.5–15.5)
WBC Count: 10.2 10*3/uL (ref 4.0–10.5)
nRBC: 0 % (ref 0.0–0.2)

## 2022-02-15 NOTE — Progress Notes (Signed)
[facility] OFFICE PROGRESS NOTE  [facility]ates, [facility] [place] [address]  DIAGNOSIS:  Stage IIIA (T1c, N2, M0) non-small cell lung cancer, moderately differentiated adenocarcinoma diagnosed in December 2022.   PD-L1 expression is 70%.    Molecular study showed no actionable mutation.  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 Kipp Brood on November 15, 2021  CURRENT THERAPY:  Adjuvant systemic chemotherapy with carboplatin for AUC of 6 and paclitaxel 200 Mg/M2 with Neulasta support every 3 weeks.  First dose January 08, 2022.  The patient was not eligible for treatment with cisplatin and Alimta because of renal insufficiency.  Status post 2 cycles.  INTERVAL HISTORY: [name] 71 y.o. male returns to the clinic today for a follow-up visit.  The patient is feeling fairly well today without any concerning complaints except for fatigue.  He is currently undergoing adjuvant systemic chemotherapy.  He is status post 2 cycles and is tolerating it well without any concerning adverse side effects besides fatigue.  He denies any recent fevers, chills, night sweats, or unexplained weight loss. He denies any nausea, vomiting, diarrhea, or constipation.  Denies any headache or visual changes.  Denies any shortness of breath, cough, or hemoptysis.  He sometimes has left-sided chest discomfort due to his left upper lobectomy which was performed in December 2022. He was prescribed percocet, robaxin, and gabapentin. He wants a refill of percocet which was prescribed by Dr. Kipp Brood. The patient states he only takes it if needed, which is usually at night before bed. He is here today for evaluation and repeat blood work before starting cycle #3.  MEDICAL HISTORY: Past Medical History:  Diagnosis Date    Diabetes mellitus ([place])    Dyslipidemia    Hypertension     ALLERGIES:  has No Known Allergies.  MEDICATIONS:  Current Outpatient Medications  Medication Sig Dispense Refill   atenolol (TENORMIN) 100 MG tablet Take 100 mg by mouth daily.     atorvastatin (LIPITOR) 20 MG tablet Take 20 mg by mouth daily.     famotidine (PEPCID) 20 MG tablet Take 20 mg by mouth daily.     gabapentin (NEURONTIN) 300 MG capsule Take 1 capsule (300 mg total) by mouth 3 (three) times daily. 90 capsule 2   losartan-hydrochlorothiazide (HYZAAR) 100-12.5 MG tablet Take 1 tablet by mouth daily.     metFORMIN (GLUCOPHAGE-XR) 500 MG 24 hr tablet Take 1,000 mg by mouth every evening.     methocarbamol (ROBAXIN) 500 MG tablet Take 1 tablet (500 mg total) by mouth 3 (three) times daily. 90 tablet 0   oxyCODONE-acetaminophen (PERCOCET) 10-325 MG tablet Take 1 tablet by mouth every 4 (four) hours as needed for pain. 60 tablet 0   prochlorperazine (COMPAZINE) 10 MG tablet Take 1 tablet (10 mg total) by mouth every 6 (six) hours as needed for nausea or vomiting. 30 tablet 0   No current facility-administered medications for this visit.    SURGICAL HISTORY:  Past Surgical History:  Procedure Laterality Date   BRONCHIAL BIOPSY  11/15/2021   Procedure: BRONCHIAL BIOPSIES;  Surgeon: Garner Nash, DO;  Location: [facility];  Service: Pulmonary;;   BRONCHIAL BRUSHINGS  11/15/2021   Procedure: BRONCHIAL BRUSHINGS;  Surgeon: Garner Nash, DO;  Location: [place];  Service: Pulmonary;;   FIDUCIAL MARKER PLACEMENT  11/15/2021  Procedure: FIDUCIAL DYE MARKING;  Surgeon: Garner Nash, DO;  Location: [facility];  Service: Pulmonary;;   INGUINAL HERNIA REPAIR     INTERCOSTAL NERVE BLOCK Left 11/15/2021   Procedure: INTERCOSTAL NERVE BLOCK;  Surgeon: Lajuana Matte, MD;  Location: [facility];  Service: Thoracic;  Laterality: Left;   LOBECTOMY Left 11/15/2021   Procedure: LEFT UPPER LOBECTOMY;  Surgeon:  Lajuana Matte, MD;  Location: [place];  Service: Thoracic;  Laterality: Left;   MEDIASTINAL EXPLORATION Left 11/15/2021   Procedure: MEDIASTINAL LYMPH NODE EXPLORATION;  Surgeon: Lajuana Matte, MD;  Location: [facility];  Service: Thoracic;  Laterality: Left;   NODE DISSECTION Left 11/15/2021   Procedure: NODE DISSECTION;  Surgeon: Lajuana Matte, MD;  Location: [place];  Service: Thoracic;  Laterality: Left;   RIB PLATING Left 11/15/2021   Procedure: RIB PLATING;  Surgeon: Lajuana Matte, MD;  Location: [place];  Service: Thoracic;  Laterality: Left;   THORACOTOMY/LOBECTOMY Left 11/15/2021   Procedure: EMERGENCY THORACOTOMY;  Surgeon: Lajuana Matte, MD;  Location: [facility];  Service: Thoracic;  Laterality: Left;   VIDEO BRONCHOSCOPY WITH RADIAL ENDOBRONCHIAL ULTRASOUND  11/15/2021   Procedure: VIDEO BRONCHOSCOPY WITH RADIAL ENDOBRONCHIAL ULTRASOUND;  Surgeon: Garner Nash, DO;  Location: [facility];  Service: Pulmonary;;    REVIEW OF SYSTEMS:   Review of Systems  Constitutional: Positive for fatigue. Negative for appetite change, chills, fever and unexpected weight change.  HENT: Negative for mouth sores, nosebleeds, sore throat and trouble swallowing.   Eyes: Negative for eye problems and icterus.  Respiratory: Negative for cough, hemoptysis, shortness of breath and wheezing.   Cardiovascular: Positive for some occasional left sided rib pain from prior lobectomy. Negative for chest pain and leg swelling.  Gastrointestinal: Negative for abdominal pain, constipation, diarrhea, nausea and vomiting.  Genitourinary: Negative for bladder incontinence, difficulty urinating, dysuria, frequency and hematuria.   Musculoskeletal: Negative for back pain, gait problem, neck pain and neck stiffness.  Skin: Negative for itching and rash.  Neurological: Negative for dizziness, extremity weakness, gait problem, headaches, light-headedness and seizures.  Hematological: Negative for  adenopathy. Does not bruise/bleed easily.  Psychiatric/Behavioral: Negative for confusion, depression and sleep disturbance. The patient is not nervous/anxious.     PHYSICAL EXAMINATION:  Blood pressure (!) 145/99, pulse 73, temperature 97.6 F (36.4 C), temperature source Oral, resp. rate 16, weight 197 lb 3.2 oz (89.4 kg), SpO2 99 %.  ECOG PERFORMANCE STATUS: 1  Physical Exam  Constitutional: Oriented to person, place, and time and well-developed, well-nourished, and in no distress.  HENT:  Head: Normocephalic and atraumatic.  Mouth/Throat: Oropharynx is clear and moist. No oropharyngeal exudate.  Eyes: Conjunctivae are normal. Right eye exhibits no discharge. Left eye exhibits no discharge. No scleral icterus.  Neck: Normal range of motion. Neck supple.  Cardiovascular: Normal rate, regular rhythm, normal heart sounds and intact distal pulses.   Pulmonary/Chest: Effort normal and breath sounds normal. No respiratory distress. No wheezes. No rales.  Abdominal: Soft. Bowel sounds are normal. Exhibits no distension and no mass. There is no tenderness.  Musculoskeletal: Normal range of motion. Exhibits no edema.  Lymphadenopathy:    No cervical adenopathy.  Neurological: Alert and oriented to person, place, and time. Exhibits normal muscle tone. Gait normal. Coordination normal.  Skin: Skin is warm and dry. No rash noted. Not diaphoretic. No erythema. No pallor.  Psychiatric: Mood, memory and judgment normal.  Vitals reviewed.  LABORATORY DATA: Lab Results  Component Value Date  WBC 8.7 02/19/2022   HGB 11.7 (L) 02/19/2022   HCT 36.1 (L) 02/19/2022   MCV 88.0 02/19/2022   PLT 170 02/19/2022      Chemistry      Component Value Date/Time   NA 139 02/19/2022 0801   K 3.7 02/19/2022 0801   CL 106 02/19/2022 0801   CO2 24 02/19/2022 0801   BUN 29 (H) 02/19/2022 0801   CREATININE 1.21 02/19/2022 0801      Component Value Date/Time   CALCIUM 9.5 02/19/2022 0801   ALKPHOS 90  02/19/2022 0801   AST 15 02/19/2022 0801   ALT 14 02/19/2022 0801   BILITOT 0.4 02/19/2022 0801       RADIOGRAPHIC STUDIES:  No results found.   ASSESSMENT/PLAN:  This is a very pleasant 71 year old Caucasian male diagnosed with stage IIIa (T1c, N2, M0) non-small cell lung cancer, adenocarcinoma.  He presented with a left upper lobe nodule in addition to mediastinal lymphadenopathy.  He was diagnosed in December 2022.  He has no actionable mutations and his PL 1 expression is 70%.  The patient is status post a left upper lobectomy with lymph node dissection under the care of Dr. Kipp Brood which was performed in December 2022.  The resection margins were negative for malignancy and there was no visceral pleural involvement but there was lymphovascular invasion.  Therefore, the patient is on adjuvant systemic chemotherapy for a total of 4 cycles which will likely be followed by adjuvant immunotherapy with Tecentriq.  Presently, he is status post 2 cycles of carboplatin and paclitaxel every 3 weeks with Neulasta support.  He is not a good candidate for Alimta and cisplatin due to his renal insufficiency.  The patient is feeling fairly well today except for mild fatigue.  Labs were reviewed.  Recommend that he proceed with cycle #3 today scheduled.  We will see him back for follow-up visit in 3 weeks for evaluation before starting cycle #4.  I advised him to reach out to Dr. Abran Duke office for a refill or adjustment on his pain medication. However, I did advise that this far out from surgery, he should be trying to use tylenol first to see if more conservative options control the pain. He states tylenol does not help him. He has a few tablets left of percocet and will call Dr. Abran Duke office for recommendations/consideration of a refill or adjustment.   The patient was advised to call immediately if she has any concerning symptoms in the interval. The patient voices understanding  of current disease status and treatment options and is in agreement with the current care plan. All questions were answered. The patient knows to call the clinic with any problems, questions or concerns. We can certainly see the patient much sooner if necessary   No orders of the defined types were placed in this encounter.     The total time spent in the appointment was 20-29 minutes.   Wanona Stare L Fortunata Betty, PA-C 02/19/22

## 2022-02-19 ENCOUNTER — Inpatient Hospital Stay (HOSPITAL_BASED_OUTPATIENT_CLINIC_OR_DEPARTMENT_OTHER): Payer: Medicare Other | Admitting: Physician Assistant

## 2022-02-19 ENCOUNTER — Inpatient Hospital Stay: Payer: Medicare Other

## 2022-02-19 ENCOUNTER — Other Ambulatory Visit: Payer: Self-pay

## 2022-02-19 ENCOUNTER — Inpatient Hospital Stay: Payer: Medicare Other | Attending: Internal Medicine

## 2022-02-19 ENCOUNTER — Encounter: Payer: Self-pay | Admitting: Physician Assistant

## 2022-02-19 VITALS — BP 145/99 | HR 73 | Temp 97.6°F | Resp 16 | Wt 197.2 lb

## 2022-02-19 DIAGNOSIS — Z5111 Encounter for antineoplastic chemotherapy: Secondary | ICD-10-CM | POA: Insufficient documentation

## 2022-02-19 DIAGNOSIS — I10 Essential (primary) hypertension: Secondary | ICD-10-CM

## 2022-02-19 DIAGNOSIS — C3492 Malignant neoplasm of unspecified part of left bronchus or lung: Secondary | ICD-10-CM

## 2022-02-19 DIAGNOSIS — Z5189 Encounter for other specified aftercare: Secondary | ICD-10-CM | POA: Diagnosis not present

## 2022-02-19 DIAGNOSIS — C3412 Malignant neoplasm of upper lobe, left bronchus or lung: Secondary | ICD-10-CM

## 2022-02-19 DIAGNOSIS — E119 Type 2 diabetes mellitus without complications: Secondary | ICD-10-CM

## 2022-02-19 LAB — CBC WITH DIFFERENTIAL (CANCER CENTER ONLY)
Abs Immature Granulocytes: 0.02 10*3/uL (ref 0.00–0.07)
Basophils Absolute: 0.1 10*3/uL (ref 0.0–0.1)
Basophils Relative: 1 %
Eosinophils Absolute: 0.2 10*3/uL (ref 0.0–0.5)
Eosinophils Relative: 2 %
HCT: 36.1 % — ABNORMAL LOW (ref 39.0–52.0)
Hemoglobin: 11.7 g/dL — ABNORMAL LOW (ref 13.0–17.0)
Immature Granulocytes: 0 %
Lymphocytes Relative: 44 %
Lymphs Abs: 3.8 10*3/uL (ref 0.7–4.0)
MCH: 28.5 pg (ref 26.0–34.0)
MCHC: 32.4 g/dL (ref 30.0–36.0)
MCV: 88 fL (ref 80.0–100.0)
Monocytes Absolute: 1.1 10*3/uL — ABNORMAL HIGH (ref 0.1–1.0)
Monocytes Relative: 13 %
Neutro Abs: 3.5 10*3/uL (ref 1.7–7.7)
Neutrophils Relative %: 40 %
Platelet Count: 170 10*3/uL (ref 150–400)
RBC: 4.1 MIL/uL — ABNORMAL LOW (ref 4.22–5.81)
RDW: 16.8 % — ABNORMAL HIGH (ref 11.5–15.5)
WBC Count: 8.7 10*3/uL (ref 4.0–10.5)
nRBC: 0 % (ref 0.0–0.2)

## 2022-02-19 LAB — CMP (CANCER CENTER ONLY)
ALT: 14 U/L (ref 0–44)
AST: 15 U/L (ref 15–41)
Albumin: 4.2 g/dL (ref 3.5–5.0)
Alkaline Phosphatase: 90 U/L (ref 38–126)
Anion gap: 9 (ref 5–15)
BUN: 29 mg/dL — ABNORMAL HIGH (ref 8–23)
CO2: 24 mmol/L (ref 22–32)
Calcium: 9.5 mg/dL (ref 8.9–10.3)
Chloride: 106 mmol/L (ref 98–111)
Creatinine: 1.21 mg/dL (ref 0.61–1.24)
GFR, Estimated: >60 mL/min (ref >60)
Glucose, Bld: 131 mg/dL — ABNORMAL HIGH (ref 70–99)
Potassium: 3.7 mmol/L (ref 3.5–5.1)
Sodium: 139 mmol/L (ref 135–145)
Total Bilirubin: 0.4 mg/dL (ref 0.3–1.2)
Total Protein: 7.6 g/dL (ref 6.5–8.1)

## 2022-02-19 MED ORDER — SODIUM CHLORIDE 0.9 % IV SOLN: Freq: Once | INTRAVENOUS | Status: AC

## 2022-02-19 MED ORDER — SODIUM CHLORIDE 0.9 % IV SOLN
150.0000 mg | Freq: Once | INTRAVENOUS | Status: AC
  Administered 2022-02-19: 150 mg via INTRAVENOUS
  Filled 2022-02-19: qty 150

## 2022-02-19 MED ORDER — SODIUM CHLORIDE 0.9 % IV SOLN
10.0000 mg | Freq: Once | INTRAVENOUS | Status: AC
  Administered 2022-02-19: 10 mg via INTRAVENOUS
  Filled 2022-02-19: qty 10

## 2022-02-19 MED ORDER — SODIUM CHLORIDE 0.9 % IV SOLN
200.0000 mg/m2 | Freq: Once | INTRAVENOUS | Status: AC
  Administered 2022-02-19: 414 mg via INTRAVENOUS
  Filled 2022-02-19: qty 69

## 2022-02-19 MED ORDER — SODIUM CHLORIDE 0.9 % IV SOLN
534.0000 mg | Freq: Once | INTRAVENOUS | Status: AC
  Administered 2022-02-19: 530 mg via INTRAVENOUS
  Filled 2022-02-19: qty 53

## 2022-02-19 MED ORDER — PALONOSETRON HCL INJECTION 0.25 MG/5ML
0.2500 mg | Freq: Once | INTRAVENOUS | Status: AC
  Administered 2022-02-19: 0.25 mg via INTRAVENOUS
  Filled 2022-02-19: qty 5

## 2022-02-19 MED ORDER — FAMOTIDINE IN NACL 20-0.9 MG/50ML-% IV SOLN
20.0000 mg | Freq: Once | INTRAVENOUS | Status: AC
  Administered 2022-02-19: 20 mg via INTRAVENOUS
  Filled 2022-02-19: qty 50

## 2022-02-19 MED ORDER — DIPHENHYDRAMINE HCL 50 MG/ML IJ SOLN
50.0000 mg | Freq: Once | INTRAMUSCULAR | Status: AC
  Administered 2022-02-19: 50 mg via INTRAVENOUS
  Filled 2022-02-19: qty 1

## 2022-02-19 NOTE — Patient Instructions (Signed)
[facility] MEDICAL ONCOLOGY  Discharge Instructions: Thank you for choosing [facility] to provide your oncology and hematology care.   If you have a lab appointment with the Cancer Center, please go directly to the Cancer Center and check in at the registration area.   Wear comfortable clothing and clothing appropriate for easy access to any Portacath or PICC line.   We strive to give you quality time with your provider. You may need to reschedule your appointment if you arrive late (15 or more minutes).  Arriving late affects you and other patients whose appointments are after yours.  Also, if you miss three or more appointments without notifying the office, you may be dismissed from the clinic at the provider's discretion.      For prescription refill requests, have your pharmacy contact our office and allow 72 hours for refills to be completed.    Today you received the following chemotherapy and/or immunotherapy agents: Taxol & Carboplatin   To help prevent nausea and vomiting after your treatment, we encourage you to take your nausea medication as directed.  BELOW ARE SYMPTOMS THAT SHOULD BE REPORTED IMMEDIATELY: *FEVER GREATER THAN 100.4 F (38 C) OR HIGHER *CHILLS OR SWEATING *NAUSEA AND VOMITING THAT IS NOT CONTROLLED WITH YOUR NAUSEA MEDICATION *UNUSUAL SHORTNESS OF BREATH *UNUSUAL BRUISING OR BLEEDING *URINARY PROBLEMS (pain or burning when urinating, or frequent urination) *BOWEL PROBLEMS (unusual diarrhea, constipation, pain near the anus) TENDERNESS IN MOUTH AND THROAT WITH OR WITHOUT PRESENCE OF ULCERS (sore throat, sores in mouth, or a toothache) UNUSUAL RASH, SWELLING OR PAIN  UNUSUAL VAGINAL DISCHARGE OR ITCHING   Items with * indicate a potential emergency and should be followed up as soon as possible or go to the Emergency Department if any problems should occur.  Please show the CHEMOTHERAPY ALERT CARD or IMMUNOTHERAPY ALERT CARD at  check-in to the Emergency Department and triage nurse.  Should you have questions after your visit or need to cancel or reschedule your appointment, please contact [facility] MEDICAL ONCOLOGY  Dept: [phone]  and follow the prompts.  Office hours are 8:00 a.m. to 4:30 p.m. Monday - Friday. Please note that voicemails left after 4:00 p.m. may not be returned until the following business day.  We are closed weekends and major holidays. You have access to a nurse at all times for urgent questions. Please call the main number to the clinic Dept: [phone] and follow the prompts.   For any non-urgent questions, you may also contact your provider using MyChart. We now offer e-Visits for anyone 18 and older to request care online for non-urgent symptoms. For details visit [email or web address].   Also download the MyChart app! Go to the app store, search "MyChart", open the app, select [facility] [place], and log in with your MyChart username and password.  Due to Covid, a mask is required upon entering the hospital/clinic. If you do not have a mask, one will be given to you upon arrival. For doctor visits, patients may have 1 support person aged 18 or older with them. For treatment visits, patients cannot have anyone with them due to current Covid guidelines and our immunocompromised population.   

## 2022-02-20 ENCOUNTER — Telehealth: Payer: Self-pay | Admitting: Internal Medicine

## 2022-02-20 NOTE — Telephone Encounter (Signed)
Scheduled appointment per inbasket. Patient is aware.  ?

## 2022-02-21 ENCOUNTER — Inpatient Hospital Stay: Payer: Medicare Other

## 2022-02-21 ENCOUNTER — Ambulatory Visit: Payer: Medicare Other

## 2022-02-21 ENCOUNTER — Other Ambulatory Visit: Payer: Self-pay

## 2022-02-21 VITALS — BP 141/82 | HR 86 | Temp 98.0°F | Resp 18

## 2022-02-21 DIAGNOSIS — C3492 Malignant neoplasm of unspecified part of left bronchus or lung: Secondary | ICD-10-CM

## 2022-02-21 DIAGNOSIS — Z5111 Encounter for antineoplastic chemotherapy: Secondary | ICD-10-CM | POA: Diagnosis not present

## 2022-02-21 MED ORDER — PEGFILGRASTIM-BMEZ 6 MG/0.6ML ~~LOC~~ SOSY
6.0000 mg | PREFILLED_SYRINGE | Freq: Once | SUBCUTANEOUS | Status: AC
  Administered 2022-02-21: 10:00:00 6 mg via SUBCUTANEOUS
  Filled 2022-02-21: qty 0.6

## 2022-02-26 ENCOUNTER — Inpatient Hospital Stay: Payer: Medicare Other

## 2022-02-26 ENCOUNTER — Other Ambulatory Visit: Payer: Self-pay

## 2022-02-26 DIAGNOSIS — Z5111 Encounter for antineoplastic chemotherapy: Secondary | ICD-10-CM | POA: Diagnosis not present

## 2022-02-26 DIAGNOSIS — C3492 Malignant neoplasm of unspecified part of left bronchus or lung: Secondary | ICD-10-CM

## 2022-02-26 LAB — CBC WITH DIFFERENTIAL (CANCER CENTER ONLY)
Abs Immature Granulocytes: 0.06 10*3/uL (ref 0.00–0.07)
Basophils Absolute: 0.1 10*3/uL (ref 0.0–0.1)
Basophils Relative: 0 %
Eosinophils Absolute: 0.4 10*3/uL (ref 0.0–0.5)
Eosinophils Relative: 4 %
HCT: 37.4 % — ABNORMAL LOW (ref 39.0–52.0)
Hemoglobin: 12 g/dL — ABNORMAL LOW (ref 13.0–17.0)
Immature Granulocytes: 1 %
Lymphocytes Relative: 31 %
Lymphs Abs: 3.9 10*3/uL (ref 0.7–4.0)
MCH: 28.6 pg (ref 26.0–34.0)
MCHC: 32.1 g/dL (ref 30.0–36.0)
MCV: 89 fL (ref 80.0–100.0)
Monocytes Absolute: 1.3 10*3/uL — ABNORMAL HIGH (ref 0.1–1.0)
Monocytes Relative: 10 %
Neutro Abs: 6.8 10*3/uL (ref 1.7–7.7)
Neutrophils Relative %: 54 %
Platelet Count: 158 10*3/uL (ref 150–400)
RBC: 4.2 MIL/uL — ABNORMAL LOW (ref 4.22–5.81)
RDW: 17.1 % — ABNORMAL HIGH (ref 11.5–15.5)
Smear Review: NORMAL
WBC Count: 12.5 10*3/uL — ABNORMAL HIGH (ref 4.0–10.5)
nRBC: 0 % (ref 0.0–0.2)

## 2022-02-26 LAB — CMP (CANCER CENTER ONLY)
ALT: 16 U/L (ref 0–44)
AST: 15 U/L (ref 15–41)
Albumin: 4.4 g/dL (ref 3.5–5.0)
Alkaline Phosphatase: 117 U/L (ref 38–126)
Anion gap: 12 (ref 5–15)
BUN: 35 mg/dL — ABNORMAL HIGH (ref 8–23)
CO2: 21 mmol/L — ABNORMAL LOW (ref 22–32)
Calcium: 9.8 mg/dL (ref 8.9–10.3)
Chloride: 104 mmol/L (ref 98–111)
Creatinine: 1.27 mg/dL — ABNORMAL HIGH (ref 0.61–1.24)
GFR, Estimated: >60 mL/min (ref >60)
Glucose, Bld: 159 mg/dL — ABNORMAL HIGH (ref 70–99)
Potassium: 3.9 mmol/L (ref 3.5–5.1)
Sodium: 137 mmol/L (ref 135–145)
Total Bilirubin: 0.4 mg/dL (ref 0.3–1.2)
Total Protein: 7.8 g/dL (ref 6.5–8.1)

## 2022-03-05 ENCOUNTER — Inpatient Hospital Stay: Payer: Medicare Other

## 2022-03-05 ENCOUNTER — Other Ambulatory Visit: Payer: Self-pay

## 2022-03-05 DIAGNOSIS — C3492 Malignant neoplasm of unspecified part of left bronchus or lung: Secondary | ICD-10-CM

## 2022-03-05 DIAGNOSIS — Z5111 Encounter for antineoplastic chemotherapy: Secondary | ICD-10-CM | POA: Diagnosis not present

## 2022-03-05 LAB — CMP (CANCER CENTER ONLY)
ALT: 13 U/L (ref 0–44)
AST: 14 U/L — ABNORMAL LOW (ref 15–41)
Albumin: 4.5 g/dL (ref 3.5–5.0)
Alkaline Phosphatase: 109 U/L (ref 38–126)
Anion gap: 9 (ref 5–15)
BUN: 23 mg/dL (ref 8–23)
CO2: 21 mmol/L — ABNORMAL LOW (ref 22–32)
Calcium: 9.8 mg/dL (ref 8.9–10.3)
Chloride: 106 mmol/L (ref 98–111)
Creatinine: 1.24 mg/dL (ref 0.61–1.24)
GFR, Estimated: >60 mL/min (ref >60)
Glucose, Bld: 156 mg/dL — ABNORMAL HIGH (ref 70–99)
Potassium: 4.1 mmol/L (ref 3.5–5.1)
Sodium: 136 mmol/L (ref 135–145)
Total Bilirubin: 0.4 mg/dL (ref 0.3–1.2)
Total Protein: 8.2 g/dL — ABNORMAL HIGH (ref 6.5–8.1)

## 2022-03-05 LAB — CBC WITH DIFFERENTIAL (CANCER CENTER ONLY)
Abs Immature Granulocytes: 0.09 10*3/uL — ABNORMAL HIGH (ref 0.00–0.07)
Basophils Absolute: 0.1 10*3/uL (ref 0.0–0.1)
Basophils Relative: 1 %
Eosinophils Absolute: 0.3 10*3/uL (ref 0.0–0.5)
Eosinophils Relative: 2 %
HCT: 36.4 % — ABNORMAL LOW (ref 39.0–52.0)
Hemoglobin: 12.1 g/dL — ABNORMAL LOW (ref 13.0–17.0)
Immature Granulocytes: 1 %
Lymphocytes Relative: 35 %
Lymphs Abs: 3.9 10*3/uL (ref 0.7–4.0)
MCH: 29.3 pg (ref 26.0–34.0)
MCHC: 33.2 g/dL (ref 30.0–36.0)
MCV: 88.1 fL (ref 80.0–100.0)
Monocytes Absolute: 0.9 10*3/uL (ref 0.1–1.0)
Monocytes Relative: 8 %
Neutro Abs: 5.8 10*3/uL (ref 1.7–7.7)
Neutrophils Relative %: 53 %
Platelet Count: 176 10*3/uL (ref 150–400)
RBC: 4.13 MIL/uL — ABNORMAL LOW (ref 4.22–5.81)
RDW: 18 % — ABNORMAL HIGH (ref 11.5–15.5)
WBC Count: 11.1 10*3/uL — ABNORMAL HIGH (ref 4.0–10.5)
nRBC: 0 % (ref 0.0–0.2)

## 2022-03-11 ENCOUNTER — Inpatient Hospital Stay (HOSPITAL_BASED_OUTPATIENT_CLINIC_OR_DEPARTMENT_OTHER): Payer: Medicare Other | Admitting: Internal Medicine

## 2022-03-11 ENCOUNTER — Other Ambulatory Visit: Payer: Self-pay

## 2022-03-11 ENCOUNTER — Inpatient Hospital Stay: Payer: Medicare Other

## 2022-03-11 VITALS — BP 147/89 | HR 68 | Temp 97.3°F | Resp 18 | Ht 71.0 in | Wt 203.6 lb

## 2022-03-11 DIAGNOSIS — C3490 Malignant neoplasm of unspecified part of unspecified bronchus or lung: Secondary | ICD-10-CM

## 2022-03-11 DIAGNOSIS — Z5111 Encounter for antineoplastic chemotherapy: Secondary | ICD-10-CM

## 2022-03-11 DIAGNOSIS — C3492 Malignant neoplasm of unspecified part of left bronchus or lung: Secondary | ICD-10-CM

## 2022-03-11 LAB — CBC WITH DIFFERENTIAL (CANCER CENTER ONLY)
Abs Immature Granulocytes: 0.02 10*3/uL (ref 0.00–0.07)
Basophils Absolute: 0.1 10*3/uL (ref 0.0–0.1)
Basophils Relative: 1 %
Eosinophils Absolute: 0.2 10*3/uL (ref 0.0–0.5)
Eosinophils Relative: 2 %
HCT: 33.9 % — ABNORMAL LOW (ref 39.0–52.0)
Hemoglobin: 11.2 g/dL — ABNORMAL LOW (ref 13.0–17.0)
Immature Granulocytes: 0 %
Lymphocytes Relative: 51 %
Lymphs Abs: 4.4 10*3/uL — ABNORMAL HIGH (ref 0.7–4.0)
MCH: 29.2 pg (ref 26.0–34.0)
MCHC: 33 g/dL (ref 30.0–36.0)
MCV: 88.3 fL (ref 80.0–100.0)
Monocytes Absolute: 1.1 10*3/uL — ABNORMAL HIGH (ref 0.1–1.0)
Monocytes Relative: 13 %
Neutro Abs: 2.9 10*3/uL (ref 1.7–7.7)
Neutrophils Relative %: 33 %
Platelet Count: 191 10*3/uL (ref 150–400)
RBC: 3.84 MIL/uL — ABNORMAL LOW (ref 4.22–5.81)
RDW: 18.2 % — ABNORMAL HIGH (ref 11.5–15.5)
Smear Review: NORMAL
WBC Count: 8.8 10*3/uL (ref 4.0–10.5)
nRBC: 0 % (ref 0.0–0.2)

## 2022-03-11 LAB — CMP (CANCER CENTER ONLY)
ALT: 11 U/L (ref 0–44)
AST: 13 U/L — ABNORMAL LOW (ref 15–41)
Albumin: 4.1 g/dL (ref 3.5–5.0)
Alkaline Phosphatase: 89 U/L (ref 38–126)
Anion gap: 11 (ref 5–15)
BUN: 33 mg/dL — ABNORMAL HIGH (ref 8–23)
CO2: 20 mmol/L — ABNORMAL LOW (ref 22–32)
Calcium: 8.6 mg/dL — ABNORMAL LOW (ref 8.9–10.3)
Chloride: 107 mmol/L (ref 98–111)
Creatinine: 1.33 mg/dL — ABNORMAL HIGH (ref 0.61–1.24)
GFR, Estimated: 58 mL/min — ABNORMAL LOW (ref >60)
Glucose, Bld: 135 mg/dL — ABNORMAL HIGH (ref 70–99)
Potassium: 4 mmol/L (ref 3.5–5.1)
Sodium: 138 mmol/L (ref 135–145)
Total Bilirubin: 0.4 mg/dL (ref 0.3–1.2)
Total Protein: 7.4 g/dL (ref 6.5–8.1)

## 2022-03-11 MED ORDER — PALONOSETRON HCL INJECTION 0.25 MG/5ML
0.2500 mg | Freq: Once | INTRAVENOUS | Status: AC
  Administered 2022-03-11: 0.25 mg via INTRAVENOUS
  Filled 2022-03-11: qty 5

## 2022-03-11 MED ORDER — SODIUM CHLORIDE 0.9 % IV SOLN: Freq: Once | INTRAVENOUS | Status: AC

## 2022-03-11 MED ORDER — SODIUM CHLORIDE 0.9 % IV SOLN
556.2000 mg | Freq: Once | INTRAVENOUS | Status: AC
  Administered 2022-03-11: 560 mg via INTRAVENOUS
  Filled 2022-03-11: qty 56

## 2022-03-11 MED ORDER — SODIUM CHLORIDE 0.9 % IV SOLN
150.0000 mg | Freq: Once | INTRAVENOUS | Status: AC
  Administered 2022-03-11: 150 mg via INTRAVENOUS
  Filled 2022-03-11: qty 150

## 2022-03-11 MED ORDER — SODIUM CHLORIDE 0.9 % IV SOLN
10.0000 mg | Freq: Once | INTRAVENOUS | Status: AC
  Administered 2022-03-11: 10 mg via INTRAVENOUS
  Filled 2022-03-11: qty 10

## 2022-03-11 MED ORDER — FAMOTIDINE IN NACL 20-0.9 MG/50ML-% IV SOLN
20.0000 mg | Freq: Once | INTRAVENOUS | Status: AC
  Administered 2022-03-11: 20 mg via INTRAVENOUS
  Filled 2022-03-11: qty 50

## 2022-03-11 MED ORDER — SODIUM CHLORIDE 0.9 % IV SOLN
200.0000 mg/m2 | Freq: Once | INTRAVENOUS | Status: AC
  Administered 2022-03-11: 414 mg via INTRAVENOUS
  Filled 2022-03-11: qty 69

## 2022-03-11 MED ORDER — GABAPENTIN 300 MG PO CAPS: 300.0000 mg | ORAL_CAPSULE | Freq: Three times a day (TID) | ORAL | 0 refills | Status: DC

## 2022-03-11 MED ORDER — METHOCARBAMOL 500 MG PO TABS: 500.0000 mg | ORAL_TABLET | Freq: Three times a day (TID) | ORAL | 0 refills | Status: DC

## 2022-03-11 MED ORDER — DIPHENHYDRAMINE HCL 50 MG/ML IJ SOLN
50.0000 mg | Freq: Once | INTRAMUSCULAR | Status: AC
  Administered 2022-03-11: 50 mg via INTRAVENOUS
  Filled 2022-03-11: qty 1

## 2022-03-11 NOTE — Patient Instructions (Signed)
[facility] MEDICAL ONCOLOGY  Discharge Instructions: Thank you for choosing [facility] to provide your oncology and hematology care.   If you have a lab appointment with the Cancer Center, please go directly to the Cancer Center and check in at the registration area.   Wear comfortable clothing and clothing appropriate for easy access to any Portacath or PICC line.   We strive to give you quality time with your provider. You may need to reschedule your appointment if you arrive late (15 or more minutes).  Arriving late affects you and other patients whose appointments are after yours.  Also, if you miss three or more appointments without notifying the office, you may be dismissed from the clinic at the provider's discretion.      For prescription refill requests, have your pharmacy contact our office and allow 72 hours for refills to be completed.    Today you received the following chemotherapy and/or immunotherapy agents: Taxol & Carboplatin   To help prevent nausea and vomiting after your treatment, we encourage you to take your nausea medication as directed.  BELOW ARE SYMPTOMS THAT SHOULD BE REPORTED IMMEDIATELY: *FEVER GREATER THAN 100.4 F (38 C) OR HIGHER *CHILLS OR SWEATING *NAUSEA AND VOMITING THAT IS NOT CONTROLLED WITH YOUR NAUSEA MEDICATION *UNUSUAL SHORTNESS OF BREATH *UNUSUAL BRUISING OR BLEEDING *URINARY PROBLEMS (pain or burning when urinating, or frequent urination) *BOWEL PROBLEMS (unusual diarrhea, constipation, pain near the anus) TENDERNESS IN MOUTH AND THROAT WITH OR WITHOUT PRESENCE OF ULCERS (sore throat, sores in mouth, or a toothache) UNUSUAL RASH, SWELLING OR PAIN  UNUSUAL VAGINAL DISCHARGE OR ITCHING   Items with * indicate a potential emergency and should be followed up as soon as possible or go to the Emergency Department if any problems should occur.  Please show the CHEMOTHERAPY ALERT CARD or IMMUNOTHERAPY ALERT CARD at  check-in to the Emergency Department and triage nurse.  Should you have questions after your visit or need to cancel or reschedule your appointment, please contact [facility] MEDICAL ONCOLOG[facility]  Dept: [phone]  and follow the prompts.  Office hours are 8:00 a.m. to 4:30 p.m. Monday - Friday. Please note that voicemails left after 4:00 p.m. may not be returned until the following business day.  We are closed weekends and major holidays. You have access to a nurse at all times for urgent questions. Please call the main number to the clinic Dept: [phone] and follow the prompts.   For any non-urgent questions, you may also contact your provider using MyChart. We now offer e-Visits for anyone 18 and older to request care online for non-urgent symptoms. For details visit [email or web address].   Also download the MyChart app! Go to the app store, search "MyChart", open the app, select [facility], and log in with your MyChart username and password.  Due to Covid, a mask is required upon entering the hospital/clinic. If you do not have a mask, one will be given to you upon arrival. For doctor visits, patients may have 1 support person aged 18 or older with them. For treatment visits, patients cannot have anyone with them due to current Covid guidelines and our immunocompromised population.   

## 2022-03-11 NOTE — Progress Notes (Signed)
?    [facility] ?Telephone:(336) [phone]   [phone] ? ?OFFICE PROGRESS NOTE ? ?[facility] ?[address] ?[place] [address] ? ?DIAGNOSIS: Stage IIIA (T1c, N2, M0) non-small cell lung cancer, moderately differentiated adenocarcinoma diagnosed in December 2022. ? ?PD-L1 expression is 70%.   ? ?Molecular study showed no actionable mutation.  It has only MAP2K1 ? ?PRIOR THERAPY: Status post robotic assisted left video thoracoscopy with emergency thoracotomy and left upper lobectomy and mediastinal lymph node sampling under the care of Dr. Kipp Brood on November 15, 2021 ? ?CURRENT THERAPY: Adjuvant systemic chemotherapy with carboplatin for AUC of 6 and paclitaxel 200 Mg/M2 with Neulasta support every 3 weeks.  First dose January 08, 2022.  The patient was not eligible for treatment with cisplatin and Alimta because of renal insufficiency.  Status post 3 cycles. ? ?INTERVAL HISTORY: ?[name] 71 y.o. male returns to the clinic today for follow-up visit.  The patient is feeling fine today with no concerning complaints except for the neuropathic pain at the lower rib cage more on the left side.  He is currently on Neurontin as well as Robaxin by Dr. Kipp Brood.  He is running out of his medication and requesting refill.  He denied having any current shortness of breath, cough or hemoptysis.  He has no nausea, vomiting, diarrhea or constipation.  He has no headache or visual changes.  He is here today for evaluation before starting cycle #4 of his treatment. ? ?MEDICAL HISTORY: ?Past Medical History:  ?Diagnosis Date  ? Diabetes mellitus ([place])   ? Dyslipidemia   ? Hypertension   ? ? ?ALLERGIES:  has No Known Allergies. ? ?MEDICATIONS:  ?Current Outpatient Medications  ?Medication Sig Dispense Refill  ? atenolol (TENORMIN) 100 MG tablet Take 100 mg by mouth daily.    ? atorvastatin (LIPITOR) 20 MG tablet Take 20 mg by mouth daily.    ? famotidine (PEPCID) 20  MG tablet Take 20 mg by mouth daily.    ? gabapentin (NEURONTIN) 300 MG capsule Take 1 capsule (300 mg total) by mouth 3 (three) times daily. 90 capsule 2  ? losartan-hydrochlorothiazide (HYZAAR) 100-12.5 MG tablet Take 1 tablet by mouth daily.    ? metFORMIN (GLUCOPHAGE-XR) 500 MG 24 hr tablet Take 1,000 mg by mouth every evening.    ? methocarbamol (ROBAXIN) 500 MG tablet Take 1 tablet (500 mg total) by mouth 3 (three) times daily. 90 tablet 0  ? oxyCODONE-acetaminophen (PERCOCET) 10-325 MG tablet Take 1 tablet by mouth every 4 (four) hours as needed for pain. 60 tablet 0  ? prochlorperazine (COMPAZINE) 10 MG tablet Take 1 tablet (10 mg total) by mouth every 6 (six) hours as needed for nausea or vomiting. 30 tablet 0  ? ?No current facility-administered medications for this visit.  ? ? ?SURGICAL HISTORY:  ?Past Surgical History:  ?Procedure Laterality Date  ? BRONCHIAL BIOPSY  11/15/2021  ? Procedure: BRONCHIAL BIOPSIES;  Surgeon: Garner Nash, DO;  Location: [facility];  Service: Pulmonary;;  ? BRONCHIAL BRUSHINGS  11/15/2021  ? Procedure: BRONCHIAL BRUSHINGS;  Surgeon: Garner Nash, DO;  Location: [facility];  Service: Pulmonary;;  ? FIDUCIAL MARKER PLACEMENT  11/15/2021  ? Procedure: FIDUCIAL DYE MARKING;  Surgeon: Garner Nash, DO;  Location: [facility];  Service: Pulmonary;;  ? INGUINAL HERNIA REPAIR    ? INTERCOSTAL NERVE BLOCK Left 11/15/2021  ? Procedure: INTERCOSTAL NERVE BLOCK;  Surgeon: Lajuana Matte, MD;  Location: [facility];  Service: Thoracic;  Laterality: Left;  ? LOBECTOMY Left 11/15/2021  ? Procedure: LEFT UPPER LOBECTOMY;  Surgeon: Lajuana Matte, MD;  Location: [facility];  Service: Thoracic;  Laterality: Left;  ? MEDIASTINAL EXPLORATION Left 11/15/2021  ? Procedure: MEDIASTINAL LYMPH NODE EXPLORATION;  Surgeon: Lajuana Matte, MD;  Location: [facility];  Service: Thoracic;  Laterality: Left;  ? NODE DISSECTION Left 11/15/2021  ? Procedure: NODE DISSECTION;  Surgeon: Lajuana Matte, MD;  Location: [facility];  Service: Thoracic;  Laterality: Left;  ? RIB PLATING Left 11/15/2021  ? Procedure: RIB PLATING;  Surgeon: Lajuana Matte, MD;  Location: [facility];  Service: Thoracic;  Laterality: Left;  ? THORACOTOMY/LOBECTOMY Left 11/15/2021  ? Procedure: EMERGENCY THORACOTOMY;  Surgeon: Lajuana Matte, MD;  Location: [facility];  Service: Thoracic;  Laterality: Left;  ? VIDEO BRONCHOSCOPY WITH RADIAL ENDOBRONCHIAL ULTRASOUND  11/15/2021  ? Procedure: VIDEO BRONCHOSCOPY WITH RADIAL ENDOBRONCHIAL ULTRASOUND;  Surgeon: Garner Nash, DO;  Location: [facility];  Service: Pulmonary;;  ? ? ?REVIEW OF SYSTEMS:  A comprehensive review of systems was negative except for: Constitutional: positive for fatigue ?Musculoskeletal: positive for arthralgias  ? ?PHYSICAL EXAMINATION: General appearance: alert, cooperative, fatigued, and no distress ?Head: Normocephalic, without obvious abnormality, atraumatic ?Neck: no adenopathy, no JVD, supple, symmetrical, trachea midline, and thyroid not enlarged, symmetric, no tenderness/mass/nodules ?Lymph nodes: Cervical, supraclavicular, and axillary nodes normal. ?Resp: clear to auscultation bilaterally ?Back: symmetric, no curvature. ROM normal. No CVA tenderness. ?Cardio: regular rate and rhythm, S1, S2 normal, no murmur, click, rub or gallop ?GI: soft, non-tender; bowel sounds normal; no masses,  no organomegaly ?Extremities: extremities normal, atraumatic, no cyanosis or edema ? ?ECOG PERFORMANCE STATUS: 1 - Symptomatic but completely ambulatory ? ?Blood pressure (!) 147/89, pulse 68, temperature (!) 97.3 ?F (36.3 ?C), temperature source Tympanic, resp. rate 18, height _0  (1.803 m), weight 203 lb 9 oz (92.3 kg), SpO2 99 %. ? ?LABORATORY DATA: ?Lab Results  ?Component Value Date  ? WBC 8.8 03/11/2022  ? HGB 11.2 (L) 03/11/2022  ? HCT 33.9 (L) 03/11/2022  ? MCV 88.3 03/11/2022  ? PLT 191 03/11/2022  ? ? ?  Chemistry   ?   ?Component Value Date/Time  ? NA 136  03/05/2022 0932  ? K 4.1 03/05/2022 0932  ? CL 106 03/05/2022 0932  ? CO2 21 (L) 03/05/2022 0932  ? BUN 23 03/05/2022 0932  ? CREATININE 1.24 03/05/2022 0932  ?    ?Component Value Date/Time  ? CALCIUM 9.8 03/05/2022 0932  ? ALKPHOS 109 03/05/2022 0932  ? AST 14 (L) 03/05/2022 0932  ? ALT 13 03/05/2022 0932  ? BILITOT 0.4 03/05/2022 0932  ?  ? ? ? ?RADIOGRAPHIC STUDIES: ?No results found. ? ?ASSESSMENT AND PLAN: This is a very pleasant 71 years old white male diagnosed with a stage IIIa (T1c, N2, M0) non-small cell lung cancer, adenocarcinoma presented with left upper lobe lung nodule in addition to mediastinal lymphadenopathy diagnosed in December 2022.  The patient is status post left upper lobectomy with lymph node dissection.  The resection margins were negative for malignancy and there was no visceral pleural involvement but lymphovascular invasion. ?The patient has no actionable mutations and PD-L1 expression was 70% ?I recommended for the patient to proceed with adjuvant systemic chemotherapy for 4 cycles followed by adjuvant treatment with immunotherapy with atezolizumab.  Initially will consider The patient for treatment with cisplatin and Alimta but because of his renal insufficiency, will change the adjuvant systemic chemotherapy  to carboplatin for AUC of 6 and paclitaxel 200 Mg/M2 every 3 weeks with Neulasta support. ?He is status post 3 cycles.  ?The patient has been tolerating this treatment well except for mild fatigue as well as the aching pain following the Neulasta injection. ?I recommended for him to proceed with cycle #4 of his adjuvant chemotherapy today as planned. ?For the pain from the Neulasta injection, he will continue his current treatment with Claritin and Tylenol as needed. ?For the neuropathic pain from the recent surgery, he was on treatment with Percocet by Dr. Kipp Brood but he has not taken any of his medication for a month.  He is requesting refill of Neurontin and Robaxin.  I will  give him one-time refill for now. ?The patient will come back for follow-up visit in 1 months for evaluation with repeat CT scan of the chest for restaging of his disease. ?I will discussed with him co

## 2022-03-12 ENCOUNTER — Ambulatory Visit: Payer: Medicare Other

## 2022-03-12 ENCOUNTER — Other Ambulatory Visit: Payer: Medicare Other

## 2022-03-12 ENCOUNTER — Ambulatory Visit: Payer: Medicare Other | Admitting: Internal Medicine

## 2022-03-14 ENCOUNTER — Inpatient Hospital Stay (HOSPITAL_BASED_OUTPATIENT_CLINIC_OR_DEPARTMENT_OTHER): Payer: Medicare Other

## 2022-03-14 ENCOUNTER — Other Ambulatory Visit: Payer: Self-pay

## 2022-03-14 ENCOUNTER — Ambulatory Visit: Payer: Medicare Other

## 2022-03-14 VITALS — BP 129/96 | HR 82 | Temp 98.5°F | Resp 18

## 2022-03-14 DIAGNOSIS — Z5111 Encounter for antineoplastic chemotherapy: Secondary | ICD-10-CM | POA: Diagnosis not present

## 2022-03-14 DIAGNOSIS — C3492 Malignant neoplasm of unspecified part of left bronchus or lung: Secondary | ICD-10-CM

## 2022-03-14 MED ORDER — PEGFILGRASTIM-BMEZ 6 MG/0.6ML ~~LOC~~ SOSY
6.0000 mg | PREFILLED_SYRINGE | Freq: Once | SUBCUTANEOUS | Status: AC
  Administered 2022-03-14: 6 mg via SUBCUTANEOUS
  Filled 2022-03-14: qty 0.6

## 2022-03-18 ENCOUNTER — Inpatient Hospital Stay: Payer: Medicare Other | Attending: Internal Medicine

## 2022-03-18 ENCOUNTER — Other Ambulatory Visit: Payer: Self-pay

## 2022-03-18 DIAGNOSIS — C3492 Malignant neoplasm of unspecified part of left bronchus or lung: Secondary | ICD-10-CM

## 2022-03-18 DIAGNOSIS — C3412 Malignant neoplasm of upper lobe, left bronchus or lung: Secondary | ICD-10-CM | POA: Insufficient documentation

## 2022-03-18 LAB — CMP (CANCER CENTER ONLY)
ALT: 14 U/L (ref 0–44)
AST: 14 U/L — ABNORMAL LOW (ref 15–41)
Albumin: 4.2 g/dL (ref 3.5–5.0)
Alkaline Phosphatase: 96 U/L (ref 38–126)
Anion gap: 11 (ref 5–15)
BUN: 22 mg/dL (ref 8–23)
CO2: 22 mmol/L (ref 22–32)
Calcium: 9.1 mg/dL (ref 8.9–10.3)
Chloride: 108 mmol/L (ref 98–111)
Creatinine: 1.01 mg/dL (ref 0.61–1.24)
GFR, Estimated: >60 mL/min (ref >60)
Glucose, Bld: 166 mg/dL — ABNORMAL HIGH (ref 70–99)
Potassium: 3.8 mmol/L (ref 3.5–5.1)
Sodium: 141 mmol/L (ref 135–145)
Total Bilirubin: 0.3 mg/dL (ref 0.3–1.2)
Total Protein: 7.6 g/dL (ref 6.5–8.1)

## 2022-03-18 LAB — CBC WITH DIFFERENTIAL (CANCER CENTER ONLY)
Abs Immature Granulocytes: 0.2 10*3/uL — ABNORMAL HIGH (ref 0.00–0.07)
Basophils Absolute: 0.1 10*3/uL (ref 0.0–0.1)
Basophils Relative: 1 %
Eosinophils Absolute: 0.3 10*3/uL (ref 0.0–0.5)
Eosinophils Relative: 2 %
HCT: 32.1 % — ABNORMAL LOW (ref 39.0–52.0)
Hemoglobin: 10.5 g/dL — ABNORMAL LOW (ref 13.0–17.0)
Immature Granulocytes: 2 %
Lymphocytes Relative: 22 %
Lymphs Abs: 2.9 10*3/uL (ref 0.7–4.0)
MCH: 29.5 pg (ref 26.0–34.0)
MCHC: 32.7 g/dL (ref 30.0–36.0)
MCV: 90.2 fL (ref 80.0–100.0)
Monocytes Absolute: 1.2 10*3/uL — ABNORMAL HIGH (ref 0.1–1.0)
Monocytes Relative: 9 %
Neutro Abs: 8.2 10*3/uL — ABNORMAL HIGH (ref 1.7–7.7)
Neutrophils Relative %: 64 %
Platelet Count: 137 10*3/uL — ABNORMAL LOW (ref 150–400)
RBC: 3.56 MIL/uL — ABNORMAL LOW (ref 4.22–5.81)
RDW: 18.8 % — ABNORMAL HIGH (ref 11.5–15.5)
Smear Review: NORMAL
WBC Count: 12.8 10*3/uL — ABNORMAL HIGH (ref 4.0–10.5)
nRBC: 0 % (ref 0.0–0.2)

## 2022-03-25 ENCOUNTER — Inpatient Hospital Stay: Payer: Medicare Other

## 2022-03-25 ENCOUNTER — Other Ambulatory Visit: Payer: Self-pay

## 2022-03-25 ENCOUNTER — Encounter: Payer: Self-pay | Admitting: *Deleted

## 2022-03-25 DIAGNOSIS — C3492 Malignant neoplasm of unspecified part of left bronchus or lung: Secondary | ICD-10-CM

## 2022-03-25 DIAGNOSIS — C3412 Malignant neoplasm of upper lobe, left bronchus or lung: Secondary | ICD-10-CM | POA: Diagnosis not present

## 2022-03-25 LAB — CMP (CANCER CENTER ONLY)
ALT: 14 U/L (ref 0–44)
AST: 13 U/L — ABNORMAL LOW (ref 15–41)
Albumin: 4.2 g/dL (ref 3.5–5.0)
Alkaline Phosphatase: 98 U/L (ref 38–126)
Anion gap: 8 (ref 5–15)
BUN: 31 mg/dL — ABNORMAL HIGH (ref 8–23)
CO2: 23 mmol/L (ref 22–32)
Calcium: 8.8 mg/dL — ABNORMAL LOW (ref 8.9–10.3)
Chloride: 107 mmol/L (ref 98–111)
Creatinine: 1.29 mg/dL — ABNORMAL HIGH (ref 0.61–1.24)
GFR, Estimated: 60 mL/min — ABNORMAL LOW (ref >60)
Glucose, Bld: 145 mg/dL — ABNORMAL HIGH (ref 70–99)
Potassium: 4.4 mmol/L (ref 3.5–5.1)
Sodium: 138 mmol/L (ref 135–145)
Total Bilirubin: 0.5 mg/dL (ref 0.3–1.2)
Total Protein: 7.6 g/dL (ref 6.5–8.1)

## 2022-03-25 LAB — CBC WITH DIFFERENTIAL (CANCER CENTER ONLY)
Abs Immature Granulocytes: 0.06 10*3/uL (ref 0.00–0.07)
Basophils Absolute: 0.1 10*3/uL (ref 0.0–0.1)
Basophils Relative: 1 %
Eosinophils Absolute: 0.2 10*3/uL (ref 0.0–0.5)
Eosinophils Relative: 2 %
HCT: 33.3 % — ABNORMAL LOW (ref 39.0–52.0)
Hemoglobin: 10.9 g/dL — ABNORMAL LOW (ref 13.0–17.0)
Immature Granulocytes: 1 %
Lymphocytes Relative: 34 %
Lymphs Abs: 3.7 10*3/uL (ref 0.7–4.0)
MCH: 29.3 pg (ref 26.0–34.0)
MCHC: 32.7 g/dL (ref 30.0–36.0)
MCV: 89.5 fL (ref 80.0–100.0)
Monocytes Absolute: 0.8 10*3/uL (ref 0.1–1.0)
Monocytes Relative: 7 %
Neutro Abs: 6.1 10*3/uL (ref 1.7–7.7)
Neutrophils Relative %: 55 %
Platelet Count: 156 10*3/uL (ref 150–400)
RBC: 3.72 MIL/uL — ABNORMAL LOW (ref 4.22–5.81)
RDW: 19 % — ABNORMAL HIGH (ref 11.5–15.5)
WBC Count: 10.9 10*3/uL — ABNORMAL HIGH (ref 4.0–10.5)
nRBC: 0 % (ref 0.0–0.2)

## 2022-03-25 NOTE — Progress Notes (Signed)
Oncology Nurse Navigator Documentation ? ? ?  03/25/2022  ?  9:00 AM 01/29/2022  ? 11:00 AM 01/16/2022  ? 11:00 AM 01/01/2022  ?  9:00 AM 12/12/2021  ?  9:00 AM 11/29/2021  ? 11:00 AM  ?Oncology Nurse Navigator Flowsheets  ?Abnormal Finding Date     08/17/2021   ?Confirmed Diagnosis Date     11/15/2021   ?Diagnosis Status Confirmed Diagnosis Complete    Pending Molecular Studies   ?Planned Course of Treatment    Chemotherapy Surgery;Targeted Therapy   ?Phase of Treatment Chemo   Chemo    ?Chemotherapy Actual Start Date: 01/10/2022       ?Chemotherapy Actual End Date: 03/14/2022       ?Surgery Actual Start Date:     11/15/2021   ?Navigator Follow Up Date:   02/06/2022 01/04/2022 01/01/2022 12/11/2021  ?Navigator Follow Up Reason:   Follow-up Appointment Appointment Review Follow-up Appointment Appointment Review  ?Navigation Complete Date: 03/25/2022       ?Post Navigation: Continue to Follow Patient? No       ?Reason Not Navigating Patient: No Treatment, Observation Only       ?Navigator Location [facility]  ?Navigator Encounter Type Appt/Treatment Plan Review/I followed up on patient's plan of care. He has completed his adjuvant systemic therapy and will not be on observation.  His one month follow up with Dr. Julien Nordmann is scheduled at this time.  Diagnostic Results;Follow-up Appt Clinic/MDC Clinic/[facility]C Pathology Review Pathology Review  ?Treatment Initiated Date     11/15/2021   ?Patient Visit Type Other [facility] Check;Follow-up MedOnc;Follow-up MedOnc;Follow-up Other Other  ?Treatment Phase Post-Tx Follow-up Treatment Treatment Pre-Tx/Tx Discussion Other Other  ?Barriers/Navigation Needs No Barriers At This Time Cytogeneticist of Care Coordination of Care  ?Education  Other Other Other    ?Interventions None Required Education;Psycho-Social Support;Surgical Clearance Education;Psycho-Social Support Coordination of  Care;Psycho-Social Support Coordination of Care Coordination of Care  ?Acuity Level 1-No Barriers Level 2-Minimal Needs (1-2 Barriers Identified) Level 3-Moderate Needs (3-4 Barriers Identified) Level 3-Moderate Needs (3-4 Barriers Identified) Level 2-Minimal Needs (1-2 Barriers Identified) Level 2-Minimal Needs (1-2 Barriers Identified)  ?Coordination of Care     Pathology Pathology  ?Education Method  Verbal;Other Verbal     ?Time Spent with Patient 15 30 30 30  45 30  ?  ?

## 2022-04-01 ENCOUNTER — Inpatient Hospital Stay: Payer: Medicare Other

## 2022-04-01 ENCOUNTER — Other Ambulatory Visit: Payer: Self-pay

## 2022-04-01 DIAGNOSIS — C3490 Malignant neoplasm of unspecified part of unspecified bronchus or lung: Secondary | ICD-10-CM

## 2022-04-01 DIAGNOSIS — C3412 Malignant neoplasm of upper lobe, left bronchus or lung: Secondary | ICD-10-CM | POA: Diagnosis not present

## 2022-04-01 LAB — CBC WITH DIFFERENTIAL (CANCER CENTER ONLY)
Abs Immature Granulocytes: 0.01 10*3/uL (ref 0.00–0.07)
Basophils Absolute: 0.1 10*3/uL (ref 0.0–0.1)
Basophils Relative: 1 %
Eosinophils Absolute: 0.1 10*3/uL (ref 0.0–0.5)
Eosinophils Relative: 2 %
HCT: 31.4 % — ABNORMAL LOW (ref 39.0–52.0)
Hemoglobin: 10.4 g/dL — ABNORMAL LOW (ref 13.0–17.0)
Immature Granulocytes: 0 %
Lymphocytes Relative: 40 %
Lymphs Abs: 2.8 10*3/uL (ref 0.7–4.0)
MCH: 30.2 pg (ref 26.0–34.0)
MCHC: 33.1 g/dL (ref 30.0–36.0)
MCV: 91.3 fL (ref 80.0–100.0)
Monocytes Absolute: 1 10*3/uL (ref 0.1–1.0)
Monocytes Relative: 14 %
Neutro Abs: 3 10*3/uL (ref 1.7–7.7)
Neutrophils Relative %: 43 %
Platelet Count: 185 10*3/uL (ref 150–400)
RBC: 3.44 MIL/uL — ABNORMAL LOW (ref 4.22–5.81)
RDW: 19.3 % — ABNORMAL HIGH (ref 11.5–15.5)
WBC Count: 7 10*3/uL (ref 4.0–10.5)
nRBC: 0 % (ref 0.0–0.2)

## 2022-04-01 LAB — CMP (CANCER CENTER ONLY)
ALT: 11 U/L (ref 0–44)
AST: 15 U/L (ref 15–41)
Albumin: 4.2 g/dL (ref 3.5–5.0)
Alkaline Phosphatase: 80 U/L (ref 38–126)
Anion gap: 8 (ref 5–15)
BUN: 33 mg/dL — ABNORMAL HIGH (ref 8–23)
CO2: 25 mmol/L (ref 22–32)
Calcium: 9.3 mg/dL (ref 8.9–10.3)
Chloride: 104 mmol/L (ref 98–111)
Creatinine: 1.22 mg/dL (ref 0.61–1.24)
GFR, Estimated: >60 mL/min (ref >60)
Glucose, Bld: 147 mg/dL — ABNORMAL HIGH (ref 70–99)
Potassium: 4 mmol/L (ref 3.5–5.1)
Sodium: 137 mmol/L (ref 135–145)
Total Bilirubin: 0.6 mg/dL (ref 0.3–1.2)
Total Protein: 7.6 g/dL (ref 6.5–8.1)

## 2022-04-03 ENCOUNTER — Telehealth: Payer: Self-pay | Admitting: Internal Medicine

## 2022-04-03 NOTE — Telephone Encounter (Signed)
Called patient regarding upcoming appointments, patient is notified. 

## 2022-04-04 ENCOUNTER — Encounter: Payer: Self-pay | Admitting: Internal Medicine

## 2022-04-05 ENCOUNTER — Encounter: Payer: Self-pay | Admitting: Medical Oncology

## 2022-04-05 ENCOUNTER — Other Ambulatory Visit: Payer: Self-pay | Admitting: Medical Oncology

## 2022-04-05 ENCOUNTER — Telehealth: Payer: Self-pay | Admitting: *Deleted

## 2022-04-05 NOTE — Telephone Encounter (Signed)
[name], patient's wife, called stating the patient is experiencing tight pain in his surgical site area s/p thoracotomy lobectomy 12/1 by Dr. Kipp Brood. Wife states she believes patient is getting worse since surgery, chemotherapy and immunotherapy and is requesting a scan. Advised wife that patient does have a CT chest scheduled for next week with his oncologist. Wife requesting abdominal scan, advised her to contact PCP if patient is experiencing abdominal pain. Asked for patient to describe pain. Wife states he describes pain as a "tightness". Advised wife Dr. Kipp Brood encouraged continued use of gabapentin and robaxin. Per wife, he is taking medications. Wife requests an appt with Dr. Kipp Brood. Appt scheduled for after CT scan on May 5th. Wife verbalizes understanding and acknowledges receipt.  ?

## 2022-04-05 NOTE — Progress Notes (Signed)
Email response sent. ?

## 2022-04-06 ENCOUNTER — Other Ambulatory Visit: Payer: Self-pay | Admitting: Internal Medicine

## 2022-04-08 ENCOUNTER — Encounter: Payer: Self-pay | Admitting: Internal Medicine

## 2022-04-08 MED ORDER — GABAPENTIN 300 MG PO CAPS: 300.0000 mg | ORAL_CAPSULE | Freq: Three times a day (TID) | ORAL | 0 refills | Status: DC

## 2022-04-08 MED ORDER — METHOCARBAMOL 500 MG PO TABS: 500.0000 mg | ORAL_TABLET | Freq: Three times a day (TID) | ORAL | 0 refills | Status: DC

## 2022-04-11 ENCOUNTER — Ambulatory Visit (HOSPITAL_COMMUNITY)
Admission: RE | Admit: 2022-04-11 | Discharge: 2022-04-11 | Disposition: A | Discharge status: Home / Self Care | Payer: Medicare Other | Source: Ambulatory Visit | Attending: Internal Medicine | Admitting: Internal Medicine

## 2022-04-11 ENCOUNTER — Inpatient Hospital Stay: Payer: Medicare Other

## 2022-04-11 ENCOUNTER — Other Ambulatory Visit: Payer: Self-pay | Admitting: Medical Oncology

## 2022-04-11 DIAGNOSIS — C3490 Malignant neoplasm of unspecified part of unspecified bronchus or lung: Secondary | ICD-10-CM | POA: Diagnosis present

## 2022-04-11 MED ORDER — SODIUM CHLORIDE (PF) 0.9 % IJ SOLN
INTRAMUSCULAR | Status: AC
  Filled 2022-04-11: qty 50

## 2022-04-11 MED ORDER — IOHEXOL 300 MG/ML  SOLN
75.0000 mL | Freq: Once | INTRAMUSCULAR | Status: AC | PRN
  Administered 2022-04-11: 75 mL via INTRAVENOUS

## 2022-04-15 ENCOUNTER — Encounter (INDEPENDENT_AMBULATORY_CARE_PROVIDER_SITE_OTHER): Payer: Self-pay

## 2022-04-15 ENCOUNTER — Encounter: Payer: Self-pay | Admitting: Medical Oncology

## 2022-04-15 ENCOUNTER — Other Ambulatory Visit: Payer: Self-pay

## 2022-04-15 ENCOUNTER — Inpatient Hospital Stay: Payer: Medicare Other | Attending: Internal Medicine | Admitting: Internal Medicine

## 2022-04-15 VITALS — BP 151/83 | HR 68 | Temp 97.5°F | Resp 18 | Wt 205.6 lb

## 2022-04-15 DIAGNOSIS — C3412 Malignant neoplasm of upper lobe, left bronchus or lung: Secondary | ICD-10-CM | POA: Diagnosis present

## 2022-04-15 DIAGNOSIS — E119 Type 2 diabetes mellitus without complications: Secondary | ICD-10-CM | POA: Diagnosis not present

## 2022-04-15 DIAGNOSIS — Z79899 Other long term (current) drug therapy: Secondary | ICD-10-CM | POA: Diagnosis not present

## 2022-04-15 DIAGNOSIS — C3492 Malignant neoplasm of unspecified part of left bronchus or lung: Secondary | ICD-10-CM | POA: Diagnosis not present

## 2022-04-15 DIAGNOSIS — Z5111 Encounter for antineoplastic chemotherapy: Secondary | ICD-10-CM | POA: Diagnosis not present

## 2022-04-15 DIAGNOSIS — I10 Essential (primary) hypertension: Secondary | ICD-10-CM | POA: Diagnosis not present

## 2022-04-15 DIAGNOSIS — Z5112 Encounter for antineoplastic immunotherapy: Secondary | ICD-10-CM | POA: Diagnosis present

## 2022-04-15 NOTE — Progress Notes (Signed)
?    [facility] ?Telephone:(336) [phone]   [phone] ? ?OFFICE PROGRESS NOTE ? ?[facility] ?[address] ?[place] [address] ? ?DIAGNOSIS: Stage IIIA (T1c, N2, M0) non-small cell lung cancer, moderately differentiated adenocarcinoma diagnosed in December 2022. ? ?PD-L1 expression is 70%.   ? ?Molecular study showed no actionable mutation.  It has only MAP2K1 ? ?PRIOR THERAPY:  ?1) Status post robotic assisted left video thoracoscopy with emergency thoracotomy and left upper lobectomy and mediastinal lymph node sampling under the care of Dr. Kipp Brood on November 15, 2021. ?2) Adjuvant systemic chemotherapy with carboplatin for AUC of 6 and paclitaxel 200 Mg/M2 with Neulasta support every 3 weeks.  First dose January 08, 2022.  The patient was not eligible for treatment with cisplatin and Alimta because of renal insufficiency.  Status post 4 cycles.  Last dose was given on March 11, 2022. ? ?CURRENT THERAPY: Adjuvant treatment with immunotherapy with atezolizumab 1200 Mg IV every 3 weeks.  First dose 04/23/2022. ? ?INTERVAL HISTORY: ?[name] 71 y.o. male returns to the clinic today for follow-up visit accompanied by his wife.  The patient is feeling fine today with no concerning complaints except for the fatigue and alopecia.  He tolerated the previous course of adjuvant systemic chemotherapy with carboplatin and paclitaxel fairly well except for the aching pain and fatigue after the Neulasta injection.  He denied having any current chest pain, shortness of breath, cough or hemoptysis.  He has no nausea, vomiting, diarrhea or constipation.  He has no headache or visual changes.  He has no fever or chills.  He is here today for evaluation with repeat CT scan of the chest for restaging of his disease and discussion of his treatment options. ? ? ?MEDICAL HISTORY: ?Past Medical History:  ?Diagnosis Date  ? Diabetes mellitus ([place])   ? Dyslipidemia   ?  Hypertension   ? ? ?ALLERGIES:  has No Known Allergies. ? ?MEDICATIONS:  ?Current Outpatient Medications  ?Medication Sig Dispense Refill  ? atenolol (TENORMIN) 100 MG tablet Take 100 mg by mouth daily.    ? atorvastatin (LIPITOR) 20 MG tablet Take 20 mg by mouth daily.    ? famotidine (PEPCID) 20 MG tablet Take 20 mg by mouth daily.    ? gabapentin (NEURONTIN) 300 MG capsule Take 1 capsule (300 mg total) by mouth 3 (three) times daily. 90 capsule 0  ? losartan-hydrochlorothiazide (HYZAAR) 100-12.5 MG tablet Take 1 tablet by mouth daily.    ? metFORMIN (GLUCOPHAGE-XR) 500 MG 24 hr tablet Take 1,000 mg by mouth every evening.    ? methocarbamol (ROBAXIN) 500 MG tablet Take 1 tablet (500 mg total) by mouth 3 (three) times daily. 90 tablet 0  ? ?No current facility-administered medications for this visit.  ? ? ?SURGICAL HISTORY:  ?Past Surgical History:  ?Procedure Laterality Date  ? BRONCHIAL BIOPSY  11/15/2021  ? Procedure: BRONCHIAL BIOPSIES;  Surgeon: Garner Nash, DO;  Location: [facility];  Service: Pulmonary;;  ? BRONCHIAL BRUSHINGS  11/15/2021  ? Procedure: BRONCHIAL BRUSHINGS;  Surgeon: Garner Nash, DO;  Location: [facility];  Service: Pulmonary;;  ? FIDUCIAL MARKER PLACEMENT  11/15/2021  ? Procedure: FIDUCIAL DYE MARKING;  Surgeon: Garner Nash, DO;  Location: [facility];  Service: Pulmonary;;  ? INGUINAL HERNIA REPAIR    ? INTERCOSTAL NERVE BLOCK Left 11/15/2021  ? Procedure: INTERCOSTAL NERVE BLOCK;  Surgeon: Lajuana Matte, MD;  Location: [facility];  Service: Thoracic;  Laterality:  Left;  ? LOBECTOMY Left 11/15/2021  ? Procedure: LEFT UPPER LOBECTOMY;  Surgeon: Lajuana Matte, MD;  Location: [facility];  Service: Thoracic;  Laterality: Left;  ? MEDIASTINAL EXPLORATION Left 11/15/2021  ? Procedure: MEDIASTINAL LYMPH NODE EXPLORATION;  Surgeon: Lajuana Matte, MD;  Location: [facility];  Service: Thoracic;  Laterality: Left;  ? NODE DISSECTION Left 11/15/2021  ? Procedure: NODE DISSECTION;   Surgeon: Lajuana Matte, MD;  Location: [facility];  Service: Thoracic;  Laterality: Left;  ? RIB PLATING Left 11/15/2021  ? Procedure: RIB PLATING;  Surgeon: Lajuana Matte, MD;  Location: [facility];  Service: Thoracic;  Laterality: Left;  ? THORACOTOMY/LOBECTOMY Left 11/15/2021  ? Procedure: EMERGENCY THORACOTOMY;  Surgeon: Lajuana Matte, MD;  Location: [facility];  Service: Thoracic;  Laterality: Left;  ? VIDEO BRONCHOSCOPY WITH RADIAL ENDOBRONCHIAL ULTRASOUND  11/15/2021  ? Procedure: VIDEO BRONCHOSCOPY WITH RADIAL ENDOBRONCHIAL ULTRASOUND;  Surgeon: Garner Nash, DO;  Location: [facility];  Service: Pulmonary;;  ? ? ?REVIEW OF SYSTEMS:  Constitutional: positive for fatigue ?Eyes: negative ?Ears, nose, mouth, throat, and face: negative ?Respiratory: negative ?Cardiovascular: negative ?Gastrointestinal: negative ?Genitourinary:negative ?Integument/breast: negative ?Hematologic/lymphatic: negative ?Musculoskeletal:negative ?Neurological: negative ?Behavioral/Psych: negative ?Endocrine: negative ?Allergic/Immunologic: negative  ? ?PHYSICAL EXAMINATION: General appearance: alert, cooperative, fatigued, and no distress ?Head: Normocephalic, without obvious abnormality, atraumatic ?Neck: no adenopathy, no JVD, supple, symmetrical, trachea midline, and thyroid not enlarged, symmetric, no tenderness/mass/nodules ?Lymph nodes: Cervical, supraclavicular, and axillary nodes normal. ?Resp: clear to auscultation bilaterally ?Back: symmetric, no curvature. ROM normal. No CVA tenderness. ?Cardio: regular rate and rhythm, S1, S2 normal, no murmur, click, rub or gallop ?GI: soft, non-tender; bowel sounds normal; no masses,  no organomegaly ?Extremities: extremities normal, atraumatic, no cyanosis or edema ?Neurologic: Alert and oriented X 3, normal strength and tone. Normal symmetric reflexes. Normal coordination and gait ? ?ECOG PERFORMANCE STATUS: 1 - Symptomatic but completely ambulatory ? ?Blood pressure (!) 151/83,  pulse 68, temperature (!) 97.5 ?F (36.4 ?C), temperature source Tympanic, resp. rate 18, weight 205 lb 9 oz (93.2 kg), SpO2 96 %. ? ?LABORATORY DATA: ?Lab Results  ?Component Value Date  ? WBC 7.0 04/01/2022  ? HGB 10.4 (L) 04/01/2022  ? HCT 31.4 (L) 04/01/2022  ? MCV 91.3 04/01/2022  ? PLT 185 04/01/2022  ? ? ?  Chemistry   ?   ?Component Value Date/Time  ? NA 137 04/01/2022 1005  ? K 4.0 04/01/2022 1005  ? CL 104 04/01/2022 1005  ? CO2 25 04/01/2022 1005  ? BUN 33 (H) 04/01/2022 1005  ? CREATININE 1.22 04/01/2022 1005  ?    ?Component Value Date/Time  ? CALCIUM 9.3 04/01/2022 1005  ? ALKPHOS 80 04/01/2022 1005  ? AST 15 04/01/2022 1005  ? ALT 11 04/01/2022 1005  ? BILITOT 0.6 04/01/2022 1005  ?  ? ? ? ?RADIOGRAPHIC STUDIES: ?CT Chest W Contrast ? ?Result Date: 04/11/2022 ?CLINICAL DATA:  Non-small cell lung cancer, staging. * Tracking Code: BO * EXAM: CT CHEST WITH CONTRAST TECHNIQUE: Multidetector CT imaging of the chest was performed during intravenous contrast administration. RADIATION DOSE REDUCTION: This exam was performed according to the departmental dose-optimization program which includes automated exposure control, adjustment of the mA and/or kV according to patient size and/or use of iterative reconstruction technique. CONTRAST:  77m OMNIPAQUE IOHEXOL 300 MG/ML  SOLN COMPARISON:  CT November 05, 2021 FINDINGS: Cardiovascular: Aortic atherosclerosis without thoracic aortic aneurysm. No central pulmonary embolus on this nondedicated study. Coronary artery calcifications. Normal size heart. No  significant pericardial effusion/thickening. Mediastinum/Nodes: Postsurgical change along the left hilum with slight leftward deviation of the mediastinum related to prior left upper lobe lobectomy. No discrete thyroid nodules. Stable right supraclavicular lymph node which measures 7 mm in short axis and was not hypermetabolic on prior PET-CT. No pathologically enlarged mediastinal, hilar or axillary lymph nodes.  Lungs/Pleura: Prior left upper lobectomy with pleural thickening and mild nodular thickening along the major fissure on image 49/5 and along the anterior mediastinum on image 56/5, consistent with postsurgical

## 2022-04-15 NOTE — Progress Notes (Signed)
DISCONTINUE ON PATHWAY REGIMEN - Non-Small Cell Lung ? ? ?  A cycle is every 21 days: ?    Paclitaxel  ?    Carboplatin  ? ?**Always confirm dose/schedule in your pharmacy ordering system** ? ?REASON: Other Reason ?PRIOR TREATMENT: TAE825: Carboplatin AUC=6 + Paclitaxel 200 mg/m2 q21 Days x 4 Cycles ?TREATMENT RESPONSE: N/A - Adjuvant Therapy ? ?START ON PATHWAY REGIMEN - Non-Small Cell Lung ? ? ?  A cycle is every 21 days: ?    Atezolizumab  ? ?**Always confirm dose/schedule in your pharmacy ordering system** ? ?Patient Characteristics: ?Postoperative without Neoadjuvant Therapy (Pathologic Staging), Stage III, Adjuvant Chemotherapy Completed, EGFR Negative/Unknown and ALK Negative/Unknown, PD-L1 Expression ? 1% (TC) ?Therapeutic Status: Postoperative without Neoadjuvant Therapy (Pathologic Staging) ?AJCC T Category: pT1c ?AJCC N Category: pN2 ?AJCC M Category: cM0 ?AJCC 8 Stage Grouping: IIIA ?ALK Rearrangement Status: Negative ?EGFR Mutation Status: Negative/Wild Type ?PD-L1 Expression Status: PD-L1 Expression ? 1% (TC) ?Intent of Therapy: ?Curative Intent, Discussed with Patient ?

## 2022-04-18 ENCOUNTER — Telehealth: Payer: Self-pay | Admitting: Internal Medicine

## 2022-04-18 NOTE — Telephone Encounter (Signed)
Scheduled per 05/01 los, patient has been called and notified. ?

## 2022-04-19 ENCOUNTER — Ambulatory Visit (INDEPENDENT_AMBULATORY_CARE_PROVIDER_SITE_OTHER): Payer: Medicare Other | Admitting: Thoracic Surgery (Cardiothoracic Vascular Surgery)

## 2022-04-19 VITALS — BP 131/79 | HR 68 | Ht 71.0 in | Wt 205.0 lb

## 2022-04-19 DIAGNOSIS — C3492 Malignant neoplasm of unspecified part of left bronchus or lung: Secondary | ICD-10-CM

## 2022-04-19 DIAGNOSIS — Z09 Encounter for follow-up examination after completed treatment for conditions other than malignant neoplasm: Secondary | ICD-10-CM | POA: Diagnosis not present

## 2022-04-19 MED ORDER — METHOCARBAMOL 500 MG PO TABS: 500.0000 mg | ORAL_TABLET | Freq: Three times a day (TID) | ORAL | 0 refills | Status: DC

## 2022-04-19 MED ORDER — GABAPENTIN 300 MG PO CAPS: 300.0000 mg | ORAL_CAPSULE | Freq: Three times a day (TID) | ORAL | 0 refills | Status: DC

## 2022-04-19 NOTE — Progress Notes (Signed)
? ?   ?  [address] ?      [place] [address] ?            [phone]       ? ?[name] ?[facility] Record #[id number] ?Date of Birth: 1951/09/04 ? ?Referring: Heilingoetter, Cassandr* ?Primary Care: [facility] ?Primary Cardiologist:None ? ?Reason for visit:   follow-up ? ?History of Present Illness:     ?71 year old with history robotic thoracoscopy converted to thoracotomy for left upper lobectomy.  December 2022.  He continues to have intermittent left-sided chest wall pain.  He describes it as sharp in nature.  It is mostly lower but not at the site of the thoracotomy. ? ?Physical Exam: ?BP 131/79   Pulse 68   Ht 5\' 11"  (1.803 m)   Wt 205 lb (93 kg)   SpO2 97% Comment: RA  BMI 28.59 kg/m?  ? ?Alert NAD ?Abdomen, ND ?No peripheral edema ? ?  ? ?Assessment / Plan:   ?71 year old male with history of a T1 cN2 M0 moderately differentiated adenocarcinoma of the left upper lobe with status post adjuvant chemotherapy who continues to have chest wall pain from his thoracotomy.  Made a referral to speak with the physical therapist to help mobilize his chest wall, and given him a refill for gabapentin and Robaxin.  Call as needed ? ? ?Lucile Crater Mylz Yuan ?04/19/2022 10:24 AM ? ? ? ? ? ? ?

## 2022-04-19 NOTE — Progress Notes (Signed)
Pharmacist Chemotherapy Monitoring - Initial Assessment   ? ?Anticipated start date: 04/23/22  ? ?The following has been reviewed per standard work regarding the patient's treatment regimen: ?The patient's diagnosis, treatment plan and drug doses, and organ/hematologic function ?Lab orders and baseline tests specific to treatment regimen  ?The treatment plan start date, drug sequencing, and pre-medications ?Prior authorization status  ?Patient's documented medication list, including drug-drug interaction screen and prescriptions for anti-emetics and supportive care specific to the treatment regimen ?The drug concentrations, fluid compatibility, administration routes, and timing of the medications to be used ?The patient's access for treatment and lifetime cumulative dose history, if applicable  ?The patient's medication allergies and previous infusion related reactions, if applicable  ? ?Changes made to treatment plan:  ?N/A ? ?Follow up needed:  ?N/A ? ? ?Kennith Center, Pharm.D., CPP ?04/19/2022@1 :44 PM ? ? ? ?

## 2022-04-22 ENCOUNTER — Other Ambulatory Visit: Payer: Self-pay

## 2022-04-22 DIAGNOSIS — C3492 Malignant neoplasm of unspecified part of left bronchus or lung: Secondary | ICD-10-CM

## 2022-04-23 ENCOUNTER — Inpatient Hospital Stay: Payer: Medicare Other

## 2022-04-23 ENCOUNTER — Other Ambulatory Visit: Payer: Self-pay

## 2022-04-23 ENCOUNTER — Ambulatory Visit: Payer: Medicare Other

## 2022-04-23 ENCOUNTER — Other Ambulatory Visit: Payer: Medicare Other

## 2022-04-23 VITALS — BP 127/80 | HR 71 | Temp 98.0°F | Resp 18 | Ht 71.0 in | Wt 202.0 lb

## 2022-04-23 DIAGNOSIS — Z5112 Encounter for antineoplastic immunotherapy: Secondary | ICD-10-CM | POA: Diagnosis not present

## 2022-04-23 DIAGNOSIS — C3492 Malignant neoplasm of unspecified part of left bronchus or lung: Secondary | ICD-10-CM

## 2022-04-23 LAB — CBC WITH DIFFERENTIAL (CANCER CENTER ONLY)
Abs Immature Granulocytes: 0.02 10*3/uL (ref 0.00–0.07)
Basophils Absolute: 0.1 10*3/uL (ref 0.0–0.1)
Basophils Relative: 1 %
Eosinophils Absolute: 0.4 10*3/uL (ref 0.0–0.5)
Eosinophils Relative: 4 %
HCT: 34.3 % — ABNORMAL LOW (ref 39.0–52.0)
Hemoglobin: 11.3 g/dL — ABNORMAL LOW (ref 13.0–17.0)
Immature Granulocytes: 0 %
Lymphocytes Relative: 33 %
Lymphs Abs: 3.3 10*3/uL (ref 0.7–4.0)
MCH: 30.7 pg (ref 26.0–34.0)
MCHC: 32.9 g/dL (ref 30.0–36.0)
MCV: 93.2 fL (ref 80.0–100.0)
Monocytes Absolute: 0.8 10*3/uL (ref 0.1–1.0)
Monocytes Relative: 8 %
Neutro Abs: 5.5 10*3/uL (ref 1.7–7.7)
Neutrophils Relative %: 54 %
Platelet Count: 196 10*3/uL (ref 150–400)
RBC: 3.68 MIL/uL — ABNORMAL LOW (ref 4.22–5.81)
RDW: 15.9 % — ABNORMAL HIGH (ref 11.5–15.5)
WBC Count: 10.2 10*3/uL (ref 4.0–10.5)
nRBC: 0 % (ref 0.0–0.2)

## 2022-04-23 LAB — CMP (CANCER CENTER ONLY)
ALT: 20 U/L (ref 0–44)
AST: 24 U/L (ref 15–41)
Albumin: 4.3 g/dL (ref 3.5–5.0)
Alkaline Phosphatase: 68 U/L (ref 38–126)
Anion gap: 13 (ref 5–15)
BUN: 34 mg/dL — ABNORMAL HIGH (ref 8–23)
CO2: 20 mmol/L — ABNORMAL LOW (ref 22–32)
Calcium: 9.5 mg/dL (ref 8.9–10.3)
Chloride: 106 mmol/L (ref 98–111)
Creatinine: 1.36 mg/dL — ABNORMAL HIGH (ref 0.61–1.24)
GFR, Estimated: 56 mL/min — ABNORMAL LOW (ref >60)
Glucose, Bld: 156 mg/dL — ABNORMAL HIGH (ref 70–99)
Potassium: 3.7 mmol/L (ref 3.5–5.1)
Sodium: 139 mmol/L (ref 135–145)
Total Bilirubin: 0.8 mg/dL (ref 0.3–1.2)
Total Protein: 8 g/dL (ref 6.5–8.1)

## 2022-04-23 MED ORDER — SODIUM CHLORIDE 0.9 % IV SOLN: Freq: Once | INTRAVENOUS | Status: AC

## 2022-04-23 MED ORDER — SODIUM CHLORIDE 0.9 % IV SOLN
1200.0000 mg | Freq: Once | INTRAVENOUS | Status: AC
  Administered 2022-04-23: 1200 mg via INTRAVENOUS
  Filled 2022-04-23: qty 20

## 2022-04-23 NOTE — Patient Instructions (Addendum)
[facility]  Discharge Instructions: ?Thank you for choosing [facility] to provide your oncology and hematology care.  ? ?If you have a lab appointment with the [facility], please go directly to the [facility] and check in at the registration area. ?  ?Wear comfortable clothing and clothing appropriate for easy access to any Portacath or PICC line.  ? ?We strive to give you quality time with your provider. You may need to reschedule your appointment if you arrive late (15 or more minutes).  Arriving late affects you and other patients whose appointments are after yours.  Also, if you miss three or more appointments without notifying the office, you may be dismissed from the clinic at the provider?s discretion.    ?  ?For prescription refill requests, have your pharmacy contact our office and allow 72 hours for refills to be completed.   ? ?Today you received the following chemotherapy and/or immunotherapy agents: Atezolizumab Gildardo Pounds)    ?  ?To help prevent nausea and vomiting after your treatment, we encourage you to take your nausea medication as directed. ? ?BELOW ARE SYMPTOMS THAT SHOULD BE REPORTED IMMEDIATELY: ?*FEVER GREATER THAN 100.4 F (38 ?C) OR HIGHER ?*CHILLS OR SWEATING ?*NAUSEA AND VOMITING THAT IS NOT CONTROLLED WITH YOUR NAUSEA MEDICATION ?*UNUSUAL SHORTNESS OF BREATH ?*UNUSUAL BRUISING OR BLEEDING ?*URINARY PROBLEMS (pain or burning when urinating, or frequent urination) ?*BOWEL PROBLEMS (unusual diarrhea, constipation, pain near the anus) ?TENDERNESS IN MOUTH AND THROAT WITH OR WITHOUT PRESENCE OF ULCERS (sore throat, sores in mouth, or a toothache) ?UNUSUAL RASH, SWELLING OR PAIN  ?UNUSUAL VAGINAL DISCHARGE OR ITCHING  ? ?Items with * indicate a potential emergency and should be followed up as soon as possible or go to the Emergency Department if any problems should occur. ? ?Please show the CHEMOTHERAPY ALERT CARD or IMMUNOTHERAPY ALERT CARD  at check-in to the Emergency Department and triage nurse. ? ?Should you have questions after your visit or need to cancel or reschedule your appointment, please contact [facility]  Dept: [phone]  and follow the prompts.  Office hours are 8:00 a.m. to 4:30 p.m. Monday - Friday. Please note that voicemails left after 4:00 p.m. may not be returned until the following business day.  We are closed weekends and major holidays. You have access to a nurse at all times for urgent questions. Please call the main number to the clinic Dept: [phone] and follow the prompts. ? ? ?For any non-urgent questions, you may also contact your provider using MyChart. We now offer e-Visits for anyone 66 and older to request care online for non-urgent symptoms. For details visit [email or web address]. ?  ?Also download the MyChart app! Go to the app store, search "MyChart", open the app, select [facility], and log in with your MyChart username and password. ? ?Due to Covid, a mask is required upon entering the hospital/clinic. If you do not have a mask, one will be given to you upon arrival. For doctor visits, patients may have 1 support person aged 18 or older with them. For treatment visits, patients cannot have anyone with them due to current Covid guidelines and our immunocompromised population.  ? ?Atezolizumab injection ?What is this medication? ?ATEZOLIZUMAB (a te zoe LIZ ue mab) is a monoclonal antibody. It is used to treat bladder cancer (urothelial cancer), liver cancer, lung cancer, and melanoma. ?This medicine may be used for other purposes; ask your health care provider or pharmacist if  you have questions. ?COMMON BRAND NAME(S): Tecentriq ?What should I tell my care team before I take this medication? ?They need to know if you have any of these conditions: ?autoimmune diseases like Crohn's disease, ulcerative colitis, or lupus ?have had or planning to have an allogeneic stem cell  transplant (uses someone else's stem cells) ?history of organ transplant ?history of radiation to the chest ?nervous system problems like myasthenia gravis or Guillain-Barre syndrome ?an unusual or allergic reaction to atezolizumab, other medicines, foods, dyes, or preservatives ?pregnant or trying to get pregnant ?breast-feeding ?How should I use this medication? ?This medicine is for infusion into a vein. It is given by a health care professional in a hospital or clinic setting. ?A special MedGuide will be given to you before each treatment. Be sure to read this information carefully each time. ?Talk to your pediatrician regarding the use of this medicine in children. Special care may be needed. ?Overdosage: If you think you have taken too much of this medicine contact a poison control center or emergency room at once. ?NOTE: This medicine is only for you. Do not share this medicine with others. ?What if I miss a dose? ?It is important not to miss your dose. Call your doctor or health care professional if you are unable to keep an appointment. ?What may interact with this medication? ?Interactions have not been studied. ?This list may not describe all possible interactions. Give your health care provider a list of all the medicines, herbs, non-prescription drugs, or dietary supplements you use. Also tell them if you smoke, drink alcohol, or use illegal drugs. Some items may interact with your medicine. ?What should I watch for while using this medication? ?Your condition will be monitored carefully while you are receiving this medicine. ?You may need blood work done while you are taking this medicine. ?Do not become pregnant while taking this medicine or for at least 5 months after stopping it. Women should inform their doctor if they wish to become pregnant or think they might be pregnant. There is a potential for serious side effects to an unborn child. Talk to your health care professional or pharmacist for  more information. Do not breast-feed an infant while taking this medicine or for at least 5 months after the last dose. ?What side effects may I notice from receiving this medication? ?Side effects that you should report to your doctor or health care professional as soon as possible: ?allergic reactions like skin rash, itching or hives, swelling of the face, lips, or tongue ?black, tarry stools ?bloody or watery diarrhea ?breathing problems ?changes in vision ?chest pain or chest tightness ?chills ?facial flushing ?fever ?headache ?signs and symptoms of high blood sugar such as dizziness; dry mouth; dry skin; fruity breath; nausea; stomach pain; increased hunger or thirst; increased urination ?signs and symptoms of liver injury like dark yellow or brown urine; general ill feeling or flu-like symptoms; light-colored stools; loss of appetite; nausea; right upper belly pain; unusually weak or tired; yellowing of the eyes or skin ?stomach pain ?trouble passing urine or change in the amount of urine ?Side effects that usually do not require medical attention (report to your doctor or health care professional if they continue or are bothersome): ?bone pain ?cough ?diarrhea ?joint pain ?muscle pain ?muscle weakness ?swelling of arms or legs ?tiredness ?weight loss ?This list may not describe all possible side effects. Call your doctor for medical advice about side effects. You may report side effects to FDA at 1-800-FDA-1088. ?Where  should I keep my medication? ?This drug is given in a hospital or clinic and will not be stored at home. ?NOTE: This sheet is a summary. It may not cover all possible information. If you have questions about this medicine, talk to your doctor, pharmacist, or health care provider. ?? 2023 Elsevier/Gold Standard (2021-11-02 00:00:00) ? ?

## 2022-04-24 ENCOUNTER — Telehealth: Payer: Self-pay | Admitting: Internal Medicine

## 2022-04-24 ENCOUNTER — Telehealth: Payer: Self-pay

## 2022-04-24 NOTE — Telephone Encounter (Signed)
Mr. Tatar states that he is doing fine. He is eating, drinking, and urinating well. He knows to call the office at [phone] if he has any questions or concerns. ?

## 2022-04-24 NOTE — Telephone Encounter (Signed)
-----   Message from Willis Modena, RN sent at 04/23/2022 10:56 AM EDT ----- ?Regarding: Dr. Julien Nordmann 1st time Tecentriq f/u tol well ?Pt tolerated 1st time Tecentriq well.  ? ?

## 2022-04-24 NOTE — Telephone Encounter (Signed)
.  Called patient to schedule appointment per 5.9 inbasket, patient is aware of date and time.   ?

## 2022-05-07 ENCOUNTER — Ambulatory Visit: Payer: Medicare Other | Admitting: Physical Therapy

## 2022-05-07 NOTE — Therapy (Incomplete)
OUTPATIENT PHYSICAL THERAPY CERVICAL EVALUATION   Patient Name: [name] MRN: [id number] DOB:October 18, 1951, 71 y.o., male Today's Date: 05/07/2022    Past Medical History:  Diagnosis Date   Diabetes mellitus ([place])    Dyslipidemia    Hypertension    Past Surgical History:  Procedure Laterality Date   BRONCHIAL BIOPSY  11/15/2021   Procedure: BRONCHIAL BIOPSIES;  Surgeon: Garner Nash, DO;  Location: [facility];  Service: Pulmonary;;   BRONCHIAL BRUSHINGS  11/15/2021   Procedure: BRONCHIAL BRUSHINGS;  Surgeon: Garner Nash, DO;  Location: [facility];  Service: Pulmonary;;   FIDUCIAL MARKER PLACEMENT  11/15/2021   Procedure: FIDUCIAL DYE MARKING;  Surgeon: Garner Nash, DO;  Location: [facility];  Service: Pulmonary;;   INGUINAL HERNIA REPAIR     INTERCOSTAL NERVE BLOCK Left 11/15/2021   Procedure: INTERCOSTAL NERVE BLOCK;  Surgeon: Lajuana Matte, MD;  Location: [facility];  Service: Thoracic;  Laterality: Left;   LOBECTOMY Left 11/15/2021   Procedure: LEFT UPPER LOBECTOMY;  Surgeon: Lajuana Matte, MD;  Location: [facility];  Service: Thoracic;  Laterality: Left;   MEDIASTINAL EXPLORATION Left 11/15/2021   Procedure: MEDIASTINAL LYMPH NODE EXPLORATION;  Surgeon: Lajuana Matte, MD;  Location: [facility];  Service: Thoracic;  Laterality: Left;   NODE DISSECTION Left 11/15/2021   Procedure: NODE DISSECTION;  Surgeon: Lajuana Matte, MD;  Location: [facility];  Service: Thoracic;  Laterality: Left;   RIB PLATING Left 11/15/2021   Procedure: RIB PLATING;  Surgeon: Lajuana Matte, MD;  Location: [facility];  Service: Thoracic;  Laterality: Left;   THORACOTOMY/LOBECTOMY Left 11/15/2021   Procedure: EMERGENCY THORACOTOMY;  Surgeon: Lajuana Matte, MD;  Location: [facility];  Service: Thoracic;  Laterality: Left;   VIDEO BRONCHOSCOPY WITH RADIAL ENDOBRONCHIAL ULTRASOUND  11/15/2021   Procedure: VIDEO BRONCHOSCOPY WITH RADIAL ENDOBRONCHIAL ULTRASOUND;  Surgeon:  Garner Nash, DO;  Location: [facility];  Service: Pulmonary;;   Patient Active Problem List   Diagnosis Date Noted   Encounter for antineoplastic immunotherapy 04/15/2022   Adenocarcinoma of left lung, stage 3 ([place]) 12/04/2021   Encounter for antineoplastic chemotherapy 12/04/2021   Benign hypertension 11/30/2021   Screening for malignant neoplasm of colon 11/30/2021   Type 2 diabetes mellitus without complications ([place]) 03/55/9741   Lung nodule 11/15/2021   Left upper lobe pulmonary nodule 09/12/2021    PCP: ***  REFERRING PROVIDER: ***  REFERRING DIAG: R07.89,G89.18 (ICD-10-CM) - Chest wall pain following surgery  THERAPY DIAG:  No diagnosis found.  Rationale for Evaluation and Treatment Rehabilitation  ONSET DATE: ***  SUBJECTIVE:  SUBJECTIVE STATEMENT: ***  PERTINENT HISTORY:  ***  PAIN:  Are you having pain? {OPRCPAIN:27236}  PRECAUTIONS: {Therapy precautions:24002}  WEIGHT BEARING RESTRICTIONS {Yes ***/No:24003}  FALLS:  Has patient fallen in last 6 months? {fallsyesno:27318}  LIVING ENVIRONMENT: Lives with: {OPRC lives with:25569::"lives with their family"} Lives in: {Lives in:25570} Stairs: {opstairs:27293} Has following equipment at home: {Assistive devices:23999}  OCCUPATION: ***  PLOF: {PLOF:24004}  PATIENT GOALS ***  OBJECTIVE:   DIAGNOSTIC FINDINGS:  ***  PATIENT SURVEYS:  {rehab surveys:24030}   COGNITION: Overall cognitive status: {cognition:24006}   SENSATION: {sensation:27233}  POSTURE: {posture:25561}  PALPATION: ***   CERVICAL ROM:   {AROM/PROM:27142} ROM A/PROM (deg) eval  Flexion   Extension   Right lateral flexion   Left lateral flexion   Right rotation   Left rotation    (Blank rows = not tested)  UPPER  EXTREMITY ROM:  {AROM/PROM:27142} ROM Right eval Left eval  Shoulder flexion    Shoulder extension    Shoulder abduction    Shoulder adduction    Shoulder extension    Shoulder internal rotation    Shoulder external rotation    Elbow flexion    Elbow extension    Wrist flexion    Wrist extension    Wrist ulnar deviation    Wrist radial deviation    Wrist pronation    Wrist supination     (Blank rows = not tested)  UPPER EXTREMITY MMT:  MMT Right eval Left eval  Shoulder flexion    Shoulder extension    Shoulder abduction    Shoulder adduction    Shoulder extension    Shoulder internal rotation    Shoulder external rotation    Middle trapezius    Lower trapezius    Elbow flexion    Elbow extension    Wrist flexion    Wrist extension    Wrist ulnar deviation    Wrist radial deviation    Wrist pronation    Wrist supination    Grip strength     (Blank rows = not tested)  CERVICAL SPECIAL TESTS:  {Cervical special tests:25246}   FUNCTIONAL TESTS:  {Functional tests:24029}  PATIENT SURVEYS:  {rehab surveys:24030:a}  TODAY'S TREATMENT:  ***   PATIENT EDUCATION:  Education details: *** Person educated: {Person educated:25204} Education method: {Education Method:25205} Education comprehension: {Education Comprehension:25206}   HOME EXERCISE PROGRAM: ***  ASSESSMENT:  CLINICAL IMPRESSION: Patient is a *** y.o. *** who was seen today for physical therapy evaluation and treatment for ***.    OBJECTIVE IMPAIRMENTS {opptimpairments:25111}.   ACTIVITY LIMITATIONS {activitylimitations:27494}  PARTICIPATION LIMITATIONS: {participationrestrictions:25113}  PERSONAL FACTORS {Personal factors:25162} are also affecting patient's functional outcome.   REHAB POTENTIAL: {rehabpotential:25112}  CLINICAL DECISION MAKING: {clinical decision making:25114}  EVALUATION COMPLEXITY: {Evaluation complexity:25115}   GOALS: Goals reviewed with patient?  {yes/no:20286}  SHORT TERM GOALS: Target date: {follow up:25551}   *** Baseline: *** Goal status: {GOALSTATUS:25110}  2.  *** Baseline: *** Goal status: {GOALSTATUS:25110}  3.  *** Baseline: *** Goal status: {GOALSTATUS:25110}  4.  *** Baseline: *** Goal status: {GOALSTATUS:25110}  5.  *** Baseline: *** Goal status: {GOALSTATUS:25110}  6.  *** Baseline: *** Goal status: {GOALSTATUS:25110}  LONG TERM GOALS: Target date: {follow up:25551}  *** Baseline: *** Goal status: {GOALSTATUS:25110}  2.  *** Baseline: *** Goal status: {GOALSTATUS:25110}  3.  *** Baseline: *** Goal status: {GOALSTATUS:25110}  4.  *** Baseline: *** Goal status: {GOALSTATUS:25110}  5.  *** Baseline: *** Goal status: {GOALSTATUS:25110}  6.  *** Baseline: *** Goal status: {  GOALSTATUS:25110}   PLAN: PT FREQUENCY: {rehab frequency:25116}  PT DURATION: {rehab duration:25117}  PLANNED INTERVENTIONS: {rehab planned interventions:25118::"Therapeutic exercises","Therapeutic activity","Neuromuscular re-education","Balance training","Gait training","Patient/Family education","Joint mobilization"}  PLAN FOR NEXT SESSION: Dorothea Ogle, PT 05/07/2022, 8:43 AM

## 2022-05-10 ENCOUNTER — Encounter: Payer: Self-pay | Admitting: Internal Medicine

## 2022-05-10 NOTE — Progress Notes (Unsigned)
[facility] OFFICE PROGRESS NOTE  Associates, [facility] [place] [address]  DIAGNOSIS: Stage IIIA (T1c, N2, M0) non-small cell lung cancer, moderately differentiated adenocarcinoma diagnosed in December 2022.   PD-L1 expression is 70%.    Molecular study showed no actionable mutation.  It has only MAP2K1  PRIOR THERAPY:  1) Status post robotic assisted left video thoracoscopy with emergency thoracotomy and left upper lobectomy and mediastinal lymph node sampling under the care of Dr. Kipp Brood on November 15, 2021. 2) Adjuvant systemic chemotherapy with carboplatin for AUC of 6 and paclitaxel 200 Mg/M2 with Neulasta support every 3 weeks.  First dose January 08, 2022.  The patient was not eligible for treatment with cisplatin and Alimta because of renal insufficiency.  Status post 4 cycles.  Last dose was given on March 11, 2022.  CURRENT THERAPY: Adjuvant treatment with immunotherapy with atezolizumab 1200 Mg IV every 3 weeks.  First dose 04/23/2022.  Status post 1 cycle  INTERVAL HISTORY: JAVID KEMLER 71 y.o. male returns to clinic today for follow-up visit accompanied by his wife.  The patient is feeling fairly well today without any concerning complaints.  The patient is currently undergoing treatment with adjuvant immunotherapy with Tecentriq.  He is status post his first cycle of treatment and tolerated well without any concerning adverse side effects.  Today the patient denies any fever, chills, night sweats, or unexplained weight loss.  Fatigue?  The patient reports some postsurgical discomfort for which Dr. Kipp Brood, from cardiothoracic surgery, is prescribing Percocet.  The patient denies any shortness of breath, cough, or hemoptysis.  Denies any nausea, vomiting, diarrhea, or constipation.  Denies any headache or visual changes.  Denies any rashes or skin changes.  The patient is here today for evaluation and repeat blood work before  undergoing cycle #2.     MEDICAL HISTORY: Past Medical History:  Diagnosis Date   Diabetes mellitus ([place])    Dyslipidemia    Hypertension     ALLERGIES:  has No Known Allergies.  MEDICATIONS:  Current Outpatient Medications  Medication Sig Dispense Refill   atenolol (TENORMIN) 100 MG tablet Take 100 mg by mouth daily.     atorvastatin (LIPITOR) 20 MG tablet Take 20 mg by mouth daily.     famotidine (PEPCID) 20 MG tablet Take 20 mg by mouth daily.     gabapentin (NEURONTIN) 300 MG capsule Take 1 capsule (300 mg total) by mouth 3 (three) times daily. 90 capsule 0   losartan-hydrochlorothiazide (HYZAAR) 100-12.5 MG tablet Take 1 tablet by mouth daily.     metFORMIN (GLUCOPHAGE-XR) 500 MG 24 hr tablet Take 1,000 mg by mouth every evening.     methocarbamol (ROBAXIN) 500 MG tablet Take 1 tablet (500 mg total) by mouth 3 (three) times daily. 90 tablet 0   No current facility-administered medications for this visit.    SURGICAL HISTORY:  Past Surgical History:  Procedure Laterality Date   BRONCHIAL BIOPSY  11/15/2021   Procedure: BRONCHIAL BIOPSIES;  Surgeon: Garner Nash, DO;  Location: [facility];  Service: Pulmonary;;   BRONCHIAL BRUSHINGS  11/15/2021   Procedure: BRONCHIAL BRUSHINGS;  Surgeon: Garner Nash, DO;  Location: [facility];  Service: Pulmonary;;   FIDUCIAL MARKER PLACEMENT  11/15/2021   Procedure: FIDUCIAL DYE MARKING;  Surgeon: Garner Nash, DO;  Location: [facility];  Service: Pulmonary;;   INGUINAL HERNIA REPAIR     INTERCOSTAL NERVE BLOCK Left 11/15/2021   Procedure: INTERCOSTAL NERVE BLOCK;  Surgeon: Lajuana Matte, MD;  Location: [facility];  Service: Thoracic;  Laterality: Left;   LOBECTOMY Left 11/15/2021   Procedure: LEFT UPPER LOBECTOMY;  Surgeon: Lajuana Matte, MD;  Location: [facility];  Service: Thoracic;  Laterality: Left;   MEDIASTINAL EXPLORATION Left 11/15/2021   Procedure: MEDIASTINAL LYMPH NODE EXPLORATION;  Surgeon: Lajuana Matte, MD;  Location: [facility];  Service: Thoracic;  Laterality: Left;   NODE DISSECTION Left 11/15/2021   Procedure: NODE DISSECTION;  Surgeon: Lajuana Matte, MD;  Location: [address];  Service: Thoracic;  Laterality: Left;   RIB PLATING Left 11/15/2021   Procedure: RIB PLATING;  Surgeon: Lajuana Matte, MD;  Location: [facility];  Service: Thoracic;  Laterality: Left;   THORACOTOMY/LOBECTOMY Left 11/15/2021   Procedure: EMERGENCY THORACOTOMY;  Surgeon: Lajuana Matte, MD;  Location: [facility];  Service: Thoracic;  Laterality: Left;   VIDEO BRONCHOSCOPY WITH RADIAL ENDOBRONCHIAL ULTRASOUND  11/15/2021   Procedure: VIDEO BRONCHOSCOPY WITH RADIAL ENDOBRONCHIAL ULTRASOUND;  Surgeon: Garner Nash, DO;  Location: [facility];  Service: Pulmonary;;    REVIEW OF SYSTEMS:   Review of Systems  Constitutional: Negative for appetite change, chills, fatigue, fever and unexpected weight change.  HENT:   Negative for mouth sores, nosebleeds, sore throat and trouble swallowing.   Eyes: Negative for eye problems and icterus.  Respiratory: Negative for cough, hemoptysis, shortness of breath and wheezing.   Cardiovascular: Negative for chest pain and leg swelling.  Gastrointestinal: Negative for abdominal pain, constipation, diarrhea, nausea and vomiting.  Genitourinary: Negative for bladder incontinence, difficulty urinating, dysuria, frequency and hematuria.   Musculoskeletal: Negative for back pain, gait problem, neck pain and neck stiffness.  Skin: Negative for itching and rash.  Neurological: Negative for dizziness, extremity weakness, gait problem, headaches, light-headedness and seizures.  Hematological: Negative for adenopathy. Does not bruise/bleed easily.  Psychiatric/Behavioral: Negative for confusion, depression and sleep disturbance. The patient is not nervous/anxious.     PHYSICAL EXAMINATION:  There were no vitals taken for this visit.  ECOG PERFORMANCE STATUS: {CHL ONC ECOG  [id number]  Physical Exam  Constitutional: Oriented to person, place, and time and well-developed, well-nourished, and in no distress. No distress.  HENT:  Head: Normocephalic and atraumatic.  Mouth/Throat: Oropharynx is clear and moist. No oropharyngeal exudate.  Eyes: Conjunctivae are normal. Right eye exhibits no discharge. Left eye exhibits no discharge. No scleral icterus.  Neck: Normal range of motion. Neck supple.  Cardiovascular: Normal rate, regular rhythm, normal heart sounds and intact distal pulses.   Pulmonary/Chest: Effort normal and breath sounds normal. No respiratory distress. No wheezes. No rales.  Abdominal: Soft. Bowel sounds are normal. Exhibits no distension and no mass. There is no tenderness.  Musculoskeletal: Normal range of motion. Exhibits no edema.  Lymphadenopathy:    No cervical adenopathy.  Neurological: Alert and oriented to person, place, and time. Exhibits normal muscle tone. Gait normal. Coordination normal.  Skin: Skin is warm and dry. No rash noted. Not diaphoretic. No erythema. No pallor.  Psychiatric: Mood, memory and judgment normal.  Vitals reviewed.  LABORATORY DATA: Lab Results  Component Value Date   WBC 10.2 04/23/2022   HGB 11.3 (L) 04/23/2022   HCT 34.3 (L) 04/23/2022   MCV 93.2 04/23/2022   PLT 196 04/23/2022      Chemistry      Component Value Date/Time   NA 139 04/23/2022 0816   K 3.7 04/23/2022 0816   CL 106 04/23/2022 0816   CO2 20 (L) 04/23/2022  0816   BUN 34 (H) 04/23/2022 0816   CREATININE 1.36 (H) 04/23/2022 0816      Component Value Date/Time   CALCIUM 9.5 04/23/2022 0816   ALKPHOS 68 04/23/2022 0816   AST 24 04/23/2022 0816   ALT 20 04/23/2022 0816   BILITOT 0.8 04/23/2022 0816       RADIOGRAPHIC STUDIES:  CT Chest W Contrast  Result Date: 04/11/2022 CLINICAL DATA:  Non-small cell lung cancer, staging. * Tracking Code: BO * EXAM: CT CHEST WITH CONTRAST TECHNIQUE: Multidetector CT imaging of the chest  was performed during intravenous contrast administration. RADIATION DOSE REDUCTION: This exam was performed according to the departmental dose-optimization program which includes automated exposure control, adjustment of the mA and/or kV according to patient size and/or use of iterative reconstruction technique. CONTRAST:  60m OMNIPAQUE IOHEXOL 300 MG/ML  SOLN COMPARISON:  CT November 05, 2021 FINDINGS: Cardiovascular: Aortic atherosclerosis without thoracic aortic aneurysm. No central pulmonary embolus on this nondedicated study. Coronary artery calcifications. Normal size heart. No significant pericardial effusion/thickening. Mediastinum/Nodes: Postsurgical change along the left hilum with slight leftward deviation of the mediastinum related to prior left upper lobe lobectomy. No discrete thyroid nodules. Stable right supraclavicular lymph node which measures 7 mm in short axis and was not hypermetabolic on prior PET-CT. No pathologically enlarged mediastinal, hilar or axillary lymph nodes. Lungs/Pleura: Prior left upper lobectomy with pleural thickening and mild nodular thickening along the major fissure on image 49/5 and along the anterior mediastinum on image 56/5, consistent with postsurgical change. No new suspicious pulmonary nodules or masses. No pneumothorax. Upper Abdomen: Similar mild thickening of the bilateral adrenal glands without discrete nodularity. Musculoskeletal: No aggressive lytic or blastic lesion of bone. Prior plate and screw fixation of left anterior rib fractures. Remote posterior left rib fractures. Hyperostosis diffuse idiopathic skeletal, with healing fracture of the L5 vertebral body. Multilevel degenerative changes spine. IMPRESSION: 1. Surgical changes of left upper lobectomy without convincing evidence of recurrence/residual disease or thoracic metastases. 2. aortic Atherosclerosis (ICD10-I70.0). Electronically Signed   By: JDahlia BailiffM.D.   On: 04/11/2022 15:48      ASSESSMENT/PLAN:  Is a very pleasant 71year old Caucasian male diagnosed with stage IIIa (T1c, N2, M0) non-small cell lung cancer, adenocarcinoma.  The patient presented with a left upper lobe lung nodule in addition to mediastinal lymphadenopathy.  He was diagnosed in December 2022.  The patient's PD-L1 expression is 70%.  He has no actionable mutations.  The patient underwent a left upper lobectomy with lymph node dissection under the care of Dr. LKipp Brood  The resection margins were negative for malignancy and there was no visceral pleural involvement but there was lymphovascular invasion.  Therefore the patient underwent adjuvant systemic chemotherapy with 4 cycles of carboplatin for an AUC of 6 and paclitaxel 200 mg per metered squared IV every 3 weeks with Neulasta support.  The patient is not a good candidate for cisplatin Alimta because of his renal insufficiency.  He completed this on 03/11/2022.  He tolerated this fairly well except for Neulasta associated arthralgias.  Because of his PD-L1 expression being greater than 70%, Dr. MJulien Nordmannrecommended adjuvant treatment with immunotherapy with Tecentriq 1200 mg IV every 3 weeks.  The patient is status post his first cycle and tolerated this well without any concerning adverse side effects.  The patient was seen with Dr. MJulien Nordmanntoday.  Labs were reviewed.  Recommend that he ***with cycle #2 today scheduled.  We will see him back for follow-up visit in 3  weeks for evaluation and repeat blood work before starting cycle #3.  He will continue taking Percocet as needed for his postsurgical pain as prescribed by Dr. Kipp Brood.  The patient was advised to call immediately if he has any concerning symptoms in the interval. The patient voices understanding of current disease status and treatment options and is in agreement with the current care plan. All questions were answered. The patient knows to call the clinic with any problems, questions  or concerns. We can certainly see the patient much sooner if necessary       No orders of the defined types were placed in this encounter.    I spent {CHL ONC TIME VISIT - UCJAR:[id number]} counseling the patient face to face. The total time spent in the appointment was {CHL ONC TIME VISIT - TEIHD:[id number]}.  Jakeline Dave L Vyolet Sakuma, PA-C 05/10/22

## 2022-05-15 ENCOUNTER — Encounter: Payer: Self-pay | Admitting: Physician Assistant

## 2022-05-15 ENCOUNTER — Inpatient Hospital Stay: Payer: Medicare Other

## 2022-05-15 ENCOUNTER — Other Ambulatory Visit: Payer: Self-pay

## 2022-05-15 ENCOUNTER — Ambulatory Visit: Payer: Medicare Other

## 2022-05-15 ENCOUNTER — Inpatient Hospital Stay (HOSPITAL_BASED_OUTPATIENT_CLINIC_OR_DEPARTMENT_OTHER): Payer: Medicare Other | Admitting: Physician Assistant

## 2022-05-15 ENCOUNTER — Ambulatory Visit: Payer: Medicare Other | Admitting: Physician Assistant

## 2022-05-15 ENCOUNTER — Other Ambulatory Visit: Payer: Medicare Other

## 2022-05-15 VITALS — BP 140/74 | HR 69 | Temp 97.6°F | Wt 203.5 lb

## 2022-05-15 DIAGNOSIS — Z5112 Encounter for antineoplastic immunotherapy: Secondary | ICD-10-CM | POA: Diagnosis not present

## 2022-05-15 DIAGNOSIS — C3492 Malignant neoplasm of unspecified part of left bronchus or lung: Secondary | ICD-10-CM | POA: Diagnosis not present

## 2022-05-15 DIAGNOSIS — Z5111 Encounter for antineoplastic chemotherapy: Secondary | ICD-10-CM

## 2022-05-15 LAB — CBC WITH DIFFERENTIAL (CANCER CENTER ONLY)
Abs Immature Granulocytes: 0.01 10*3/uL (ref 0.00–0.07)
Basophils Absolute: 0.1 10*3/uL (ref 0.0–0.1)
Basophils Relative: 1 %
Eosinophils Absolute: 0.4 10*3/uL (ref 0.0–0.5)
Eosinophils Relative: 5 %
HCT: 33.9 % — ABNORMAL LOW (ref 39.0–52.0)
Hemoglobin: 11.5 g/dL — ABNORMAL LOW (ref 13.0–17.0)
Immature Granulocytes: 0 %
Lymphocytes Relative: 52 %
Lymphs Abs: 4 10*3/uL (ref 0.7–4.0)
MCH: 31.2 pg (ref 26.0–34.0)
MCHC: 33.9 g/dL (ref 30.0–36.0)
MCV: 91.9 fL (ref 80.0–100.0)
Monocytes Absolute: 0.9 10*3/uL (ref 0.1–1.0)
Monocytes Relative: 11 %
Neutro Abs: 2.4 10*3/uL (ref 1.7–7.7)
Neutrophils Relative %: 31 %
Platelet Count: 175 10*3/uL (ref 150–400)
RBC: 3.69 MIL/uL — ABNORMAL LOW (ref 4.22–5.81)
RDW: 13.7 % (ref 11.5–15.5)
Smear Review: NORMAL
WBC Count: 7.7 10*3/uL (ref 4.0–10.5)
nRBC: 0 % (ref 0.0–0.2)

## 2022-05-15 LAB — CMP (CANCER CENTER ONLY)
ALT: 26 U/L (ref 0–44)
AST: 23 U/L (ref 15–41)
Albumin: 4.1 g/dL (ref 3.5–5.0)
Alkaline Phosphatase: 84 U/L (ref 38–126)
Anion gap: 10 (ref 5–15)
BUN: 31 mg/dL — ABNORMAL HIGH (ref 8–23)
CO2: 23 mmol/L (ref 22–32)
Calcium: 9.5 mg/dL (ref 8.9–10.3)
Chloride: 105 mmol/L (ref 98–111)
Creatinine: 1.68 mg/dL — ABNORMAL HIGH (ref 0.61–1.24)
GFR, Estimated: 43 mL/min — ABNORMAL LOW (ref >60)
Glucose, Bld: 143 mg/dL — ABNORMAL HIGH (ref 70–99)
Potassium: 3.9 mmol/L (ref 3.5–5.1)
Sodium: 138 mmol/L (ref 135–145)
Total Bilirubin: 0.4 mg/dL (ref 0.3–1.2)
Total Protein: 7.5 g/dL (ref 6.5–8.1)

## 2022-05-15 LAB — TSH: TSH: 2.985 u[IU]/mL (ref 0.350–4.500)

## 2022-05-15 MED ORDER — SODIUM CHLORIDE 0.9 % IV SOLN: Freq: Once | INTRAVENOUS | Status: AC

## 2022-05-15 MED ORDER — SODIUM CHLORIDE 0.9 % IV SOLN
1200.0000 mg | Freq: Once | INTRAVENOUS | Status: AC
  Administered 2022-05-15: 1200 mg via INTRAVENOUS
  Filled 2022-05-15: qty 20

## 2022-05-15 NOTE — Progress Notes (Signed)
Ok to treat today with Scr of 1.68 per Cassie Heilingoetter, PA.

## 2022-05-15 NOTE — Progress Notes (Signed)
Order for a cane has been submitted to [facility]. Pending review at this time.

## 2022-05-15 NOTE — Patient Instructions (Signed)
[facility]  Discharge Instructions: Thank you for choosing [facility] to provide your oncology and hematology care.   If you have a lab appointment with the [facility], please go directly to the [facility] and check in at the registration area.   Wear comfortable clothing and clothing appropriate for easy access to any Portacath or PICC line.   We strive to give you quality time with your provider. You may need to reschedule your appointment if you arrive late (15 or more minutes).  Arriving late affects you and other patients whose appointments are after yours.  Also, if you miss three or more appointments without notifying the office, you may be dismissed from the clinic at the provider's discretion.      For prescription refill requests, have your pharmacy contact our office and allow 72 hours for refills to be completed.    Today you received the following chemotherapy and/or immunotherapy agents: Atezolizumab (Tecentriq)      To help prevent nausea and vomiting after your treatment, we encourage you to take your nausea medication as directed.  BELOW ARE SYMPTOMS THAT SHOULD BE REPORTED IMMEDIATELY: *FEVER GREATER THAN 100.4 F (38 C) OR HIGHER *CHILLS OR SWEATING *NAUSEA AND VOMITING THAT IS NOT CONTROLLED WITH YOUR NAUSEA MEDICATION *UNUSUAL SHORTNESS OF BREATH *UNUSUAL BRUISING OR BLEEDING *URINARY PROBLEMS (pain or burning when urinating, or frequent urination) *BOWEL PROBLEMS (unusual diarrhea, constipation, pain near the anus) TENDERNESS IN MOUTH AND THROAT WITH OR WITHOUT PRESENCE OF ULCERS (sore throat, sores in mouth, or a toothache) UNUSUAL RASH, SWELLING OR PAIN  UNUSUAL VAGINAL DISCHARGE OR ITCHING   Items with * indicate a potential emergency and should be followed up as soon as possible or go to the Emergency Department if any problems should occur.  Please show the CHEMOTHERAPY ALERT CARD or IMMUNOTHERAPY ALERT CARD  at check-in to the Emergency Department and triage nurse.  Should you have questions after your visit or need to cancel or reschedule your appointment, please contact [facility]  Dept: [phone]  and follow the prompts.  Office hours are 8:00 a.m. to 4:30 p.m. Monday - Friday. Please note that voicemails left after 4:00 p.m. may not be returned until the following business day.  We are closed weekends and major holidays. You have access to a nurse at all times for urgent questions. Please call the main number to the clinic Dept: [phone] and follow the prompts.   For any non-urgent questions, you may also contact your provider using MyChart. We now offer e-Visits for anyone 13 and older to request care online for non-urgent symptoms. For details visit [email or web address].   Also download the MyChart app! Go to the app store, search "MyChart", open the app, select [facility], and log in with your MyChart username and password.  Due to Covid, a mask is required upon entering the hospital/clinic. If you do not have a mask, one will be given to you upon arrival. For doctor visits, patients may have 1 support person aged 70 or older with them. For treatment visits, patients cannot have anyone with them due to current Covid guidelines and our immunocompromised population.   Atezolizumab injection What is this medication? ATEZOLIZUMAB (a te zoe LIZ ue mab) is a monoclonal antibody. It is used to treat bladder cancer (urothelial cancer), liver cancer, lung cancer, and melanoma. This medicine may be used for other purposes; ask your health care provider or pharmacist if  you have questions. COMMON BRAND NAME(S): Tecentriq What should I tell my care team before I take this medication? They need to know if you have any of these conditions: autoimmune diseases like Crohn's disease, ulcerative colitis, or lupus have had or planning to have an allogeneic stem cell  transplant (uses someone else's stem cells) history of organ transplant history of radiation to the chest nervous system problems like myasthenia gravis or Guillain-Barre syndrome an unusual or allergic reaction to atezolizumab, other medicines, foods, dyes, or preservatives pregnant or trying to get pregnant breast-feeding How should I use this medication? This medicine is for infusion into a vein. It is given by a health care professional in a hospital or clinic setting. A special MedGuide will be given to you before each treatment. Be sure to read this information carefully each time. Talk to your pediatrician regarding the use of this medicine in children. Special care may be needed. Overdosage: If you think you have taken too much of this medicine contact a poison control center or emergency room at once. NOTE: This medicine is only for you. Do not share this medicine with others. What if I miss a dose? It is important not to miss your dose. Call your doctor or health care professional if you are unable to keep an appointment. What may interact with this medication? Interactions have not been studied. This list may not describe all possible interactions. Give your health care provider a list of all the medicines, herbs, non-prescription drugs, or dietary supplements you use. Also tell them if you smoke, drink alcohol, or use illegal drugs. Some items may interact with your medicine. What should I watch for while using this medication? Your condition will be monitored carefully while you are receiving this medicine. You may need blood work done while you are taking this medicine. Do not become pregnant while taking this medicine or for at least 5 months after stopping it. Women should inform their doctor if they wish to become pregnant or think they might be pregnant. There is a potential for serious side effects to an unborn child. Talk to your health care professional or pharmacist for  more information. Do not breast-feed an infant while taking this medicine or for at least 5 months after the last dose. What side effects may I notice from receiving this medication? Side effects that you should report to your doctor or health care professional as soon as possible: allergic reactions like skin rash, itching or hives, swelling of the face, lips, or tongue black, tarry stools bloody or watery diarrhea breathing problems changes in vision chest pain or chest tightness chills facial flushing fever headache signs and symptoms of high blood sugar such as dizziness; dry mouth; dry skin; fruity breath; nausea; stomach pain; increased hunger or thirst; increased urination signs and symptoms of liver injury like dark yellow or brown urine; general ill feeling or flu-like symptoms; light-colored stools; loss of appetite; nausea; right upper belly pain; unusually weak or tired; yellowing of the eyes or skin stomach pain trouble passing urine or change in the amount of urine Side effects that usually do not require medical attention (report to your doctor or health care professional if they continue or are bothersome): bone pain cough diarrhea joint pain muscle pain muscle weakness swelling of arms or legs tiredness weight loss This list may not describe all possible side effects. Call your doctor for medical advice about side effects. You may report side effects to FDA at 1-800-FDA-1088. Where  should I keep my medication? This drug is given in a hospital or clinic and will not be stored at home. NOTE: This sheet is a summary. It may not cover all possible information. If you have questions about this medicine, talk to your doctor, pharmacist, or health care provider.  2023 Elsevier/Gold Standard (2021-11-02 00:00:00)

## 2022-05-22 ENCOUNTER — Telehealth: Payer: Self-pay | Admitting: Internal Medicine

## 2022-05-22 NOTE — Telephone Encounter (Signed)
Called patient regarding all upcoming appointments, left a voicemail.

## 2022-06-04 ENCOUNTER — Other Ambulatory Visit: Payer: Medicare Other

## 2022-06-04 ENCOUNTER — Other Ambulatory Visit: Payer: Self-pay

## 2022-06-04 ENCOUNTER — Ambulatory Visit: Payer: Medicare Other | Admitting: Internal Medicine

## 2022-06-04 ENCOUNTER — Ambulatory Visit: Payer: Medicare Other

## 2022-06-04 ENCOUNTER — Inpatient Hospital Stay: Payer: Medicare Other | Attending: Internal Medicine

## 2022-06-04 ENCOUNTER — Inpatient Hospital Stay (HOSPITAL_BASED_OUTPATIENT_CLINIC_OR_DEPARTMENT_OTHER): Payer: Medicare Other | Admitting: Internal Medicine

## 2022-06-04 ENCOUNTER — Inpatient Hospital Stay: Payer: Medicare Other

## 2022-06-04 VITALS — BP 144/81 | HR 75 | Temp 97.8°F | Resp 16 | Ht 71.0 in | Wt 206.3 lb

## 2022-06-04 DIAGNOSIS — Z5112 Encounter for antineoplastic immunotherapy: Secondary | ICD-10-CM | POA: Insufficient documentation

## 2022-06-04 DIAGNOSIS — C3412 Malignant neoplasm of upper lobe, left bronchus or lung: Secondary | ICD-10-CM | POA: Diagnosis present

## 2022-06-04 DIAGNOSIS — Z79899 Other long term (current) drug therapy: Secondary | ICD-10-CM | POA: Diagnosis not present

## 2022-06-04 DIAGNOSIS — C3492 Malignant neoplasm of unspecified part of left bronchus or lung: Secondary | ICD-10-CM

## 2022-06-04 DIAGNOSIS — E119 Type 2 diabetes mellitus without complications: Secondary | ICD-10-CM | POA: Insufficient documentation

## 2022-06-04 DIAGNOSIS — N289 Disorder of kidney and ureter, unspecified: Secondary | ICD-10-CM | POA: Insufficient documentation

## 2022-06-04 DIAGNOSIS — I10 Essential (primary) hypertension: Secondary | ICD-10-CM | POA: Insufficient documentation

## 2022-06-04 LAB — CBC WITH DIFFERENTIAL (CANCER CENTER ONLY)
Abs Immature Granulocytes: 0.01 10*3/uL (ref 0.00–0.07)
Basophils Absolute: 0.1 10*3/uL (ref 0.0–0.1)
Basophils Relative: 1 %
Eosinophils Absolute: 0.3 10*3/uL (ref 0.0–0.5)
Eosinophils Relative: 4 %
HCT: 36.2 % — ABNORMAL LOW (ref 39.0–52.0)
Hemoglobin: 12.3 g/dL — ABNORMAL LOW (ref 13.0–17.0)
Immature Granulocytes: 0 %
Lymphocytes Relative: 54 %
Lymphs Abs: 3.4 10*3/uL (ref 0.7–4.0)
MCH: 30.8 pg (ref 26.0–34.0)
MCHC: 34 g/dL (ref 30.0–36.0)
MCV: 90.7 fL (ref 80.0–100.0)
Monocytes Absolute: 0.8 10*3/uL (ref 0.1–1.0)
Monocytes Relative: 12 %
Neutro Abs: 1.8 10*3/uL (ref 1.7–7.7)
Neutrophils Relative %: 29 %
Platelet Count: 186 10*3/uL (ref 150–400)
RBC: 3.99 MIL/uL — ABNORMAL LOW (ref 4.22–5.81)
RDW: 13.5 % (ref 11.5–15.5)
WBC Count: 6.3 10*3/uL (ref 4.0–10.5)
nRBC: 0 % (ref 0.0–0.2)

## 2022-06-04 LAB — CMP (CANCER CENTER ONLY)
ALT: 31 U/L (ref 0–44)
AST: 27 U/L (ref 15–41)
Albumin: 4.4 g/dL (ref 3.5–5.0)
Alkaline Phosphatase: 89 U/L (ref 38–126)
Anion gap: 11 (ref 5–15)
BUN: 33 mg/dL — ABNORMAL HIGH (ref 8–23)
CO2: 21 mmol/L — ABNORMAL LOW (ref 22–32)
Calcium: 9.5 mg/dL (ref 8.9–10.3)
Chloride: 104 mmol/L (ref 98–111)
Creatinine: 1.46 mg/dL — ABNORMAL HIGH (ref 0.61–1.24)
GFR, Estimated: 51 mL/min — ABNORMAL LOW (ref >60)
Glucose, Bld: 155 mg/dL — ABNORMAL HIGH (ref 70–99)
Potassium: 3.5 mmol/L (ref 3.5–5.1)
Sodium: 136 mmol/L (ref 135–145)
Total Bilirubin: 0.3 mg/dL (ref 0.3–1.2)
Total Protein: 8 g/dL (ref 6.5–8.1)

## 2022-06-04 LAB — TSH: TSH: 3.117 u[IU]/mL (ref 0.350–4.500)

## 2022-06-04 MED ORDER — SODIUM CHLORIDE 0.9 % IV SOLN: Freq: Once | INTRAVENOUS | Status: AC

## 2022-06-04 MED ORDER — SODIUM CHLORIDE 0.9 % IV SOLN
1200.0000 mg | Freq: Once | INTRAVENOUS | Status: AC
  Administered 2022-06-04: 1200 mg via INTRAVENOUS
  Filled 2022-06-04: qty 20

## 2022-06-04 NOTE — Progress Notes (Signed)
[facility] [phone]   [phone]  OFFICE PROGRESS NOTE  Associates, [place] [address]  DIAGNOSIS: Stage IIIA (T1c, N2, M0) non-small cell lung cancer, moderately differentiated adenocarcinoma diagnosed in December 2022.  PD-L1 expression is 70%.    Molecular study showed no actionable mutation.  It has only MAP2K1  PRIOR THERAPY:  1) Status post robotic assisted left video thoracoscopy with emergency thoracotomy and left upper lobectomy and mediastinal lymph node sampling under the care of Dr. Kipp Brood on November 15, 2021. 2) Adjuvant systemic chemotherapy with carboplatin for AUC of 6 and paclitaxel 200 Mg/M2 with Neulasta support every 3 weeks.  First dose January 08, 2022.  The patient was not eligible for treatment with cisplatin and Alimta because of renal insufficiency.  Status post 4 cycles.  Last dose was given on March 11, 2022.  CURRENT THERAPY: Adjuvant treatment with immunotherapy with atezolizumab 1200 Mg IV every 3 weeks.  First dose 04/23/2022.  Status post 2 cycles.  INTERVAL HISTORY: [name] 71 y.o. male returns to the clinic today for follow-up visit.  The patient is feeling fine today with no concerning complaints except for mild fatigue.  He was referred by Dr. Kipp Brood for exercise program but he has not started it yet.  He he continues to have intermittent left-sided chest pain with no shortness of breath, cough or hemoptysis.  He has no nausea, vomiting, diarrhea or constipation.  He has no headache or visual changes.  He has no recent weight loss or night sweats.  He is here today for evaluation before starting cycle #3 of his treatment with immunotherapy.   MEDICAL HISTORY: Past Medical History:  Diagnosis Date   Diabetes mellitus ([place])    Dyslipidemia    Hypertension     ALLERGIES:  has No Known Allergies.  MEDICATIONS:  Current Outpatient Medications   Medication Sig Dispense Refill   atenolol (TENORMIN) 100 MG tablet Take 100 mg by mouth daily.     atorvastatin (LIPITOR) 20 MG tablet Take 20 mg by mouth daily.     famotidine (PEPCID) 20 MG tablet Take 20 mg by mouth daily.     gabapentin (NEURONTIN) 300 MG capsule Take 1 capsule (300 mg total) by mouth 3 (three) times daily. 90 capsule 0   losartan-hydrochlorothiazide (HYZAAR) 100-12.5 MG tablet Take 1 tablet by mouth daily.     metFORMIN (GLUCOPHAGE-XR) 500 MG 24 hr tablet Take 1,000 mg by mouth every evening.     methocarbamol (ROBAXIN) 500 MG tablet Take 1 tablet (500 mg total) by mouth 3 (three) times daily. 90 tablet 0   prochlorperazine (COMPAZINE) 10 MG tablet Take 10 mg by mouth every 6 (six) hours as needed for nausea or vomiting. (Patient not taking: Reported on 05/15/2022)     No current facility-administered medications for this visit.    SURGICAL HISTORY:  Past Surgical History:  Procedure Laterality Date   BRONCHIAL BIOPSY  11/15/2021   Procedure: BRONCHIAL BIOPSIES;  Surgeon: Garner Nash, DO;  Location: [facility];  Service: Pulmonary;;   BRONCHIAL BRUSHINGS  11/15/2021   Procedure: BRONCHIAL BRUSHINGS;  Surgeon: Garner Nash, DO;  Location: [facility];  Service: Pulmonary;;   FIDUCIAL MARKER PLACEMENT  11/15/2021   Procedure: FIDUCIAL DYE MARKING;  Surgeon: Garner Nash, DO;  Location: [facility];  Service: Pulmonary;;   INGUINAL HERNIA REPAIR     INTERCOSTAL NERVE BLOCK Left 11/15/2021   Procedure: INTERCOSTAL  NERVE BLOCK;  Surgeon: Lajuana Matte, MD;  Location: [facility];  Service: Thoracic;  Laterality: Left;   LOBECTOMY Left 11/15/2021   Procedure: LEFT UPPER LOBECTOMY;  Surgeon: Lajuana Matte, MD;  Location: [facility];  Service: Thoracic;  Laterality: Left;   MEDIASTINAL EXPLORATION Left 11/15/2021   Procedure: MEDIASTINAL LYMPH NODE EXPLORATION;  Surgeon: Lajuana Matte, MD;  Location: [facility];  Service: Thoracic;  Laterality: Left;   NODE  DISSECTION Left 11/15/2021   Procedure: NODE DISSECTION;  Surgeon: Lajuana Matte, MD;  Location: [facility];  Service: Thoracic;  Laterality: Left;   RIB PLATING Left 11/15/2021   Procedure: RIB PLATING;  Surgeon: Lajuana Matte, MD;  Location: [facility];  Service: Thoracic;  Laterality: Left;   THORACOTOMY/LOBECTOMY Left 11/15/2021   Procedure: EMERGENCY THORACOTOMY;  Surgeon: Lajuana Matte, MD;  Location: [facility];  Service: Thoracic;  Laterality: Left;   VIDEO BRONCHOSCOPY WITH RADIAL ENDOBRONCHIAL ULTRASOUND  11/15/2021   Procedure: VIDEO BRONCHOSCOPY WITH RADIAL ENDOBRONCHIAL ULTRASOUND;  Surgeon: Garner Nash, DO;  Location: [facility];  Service: Pulmonary;;    REVIEW OF SYSTEMS:  A comprehensive review of systems was negative except for: Constitutional: positive for fatigue Respiratory: positive for pleurisy/chest pain   PHYSICAL EXAMINATION: General appearance: alert, cooperative, fatigued, and no distress Head: Normocephalic, without obvious abnormality, atraumatic Neck: no adenopathy, no JVD, supple, symmetrical, trachea midline, and thyroid not enlarged, symmetric, no tenderness/mass/nodules Lymph nodes: Cervical, supraclavicular, and axillary nodes normal. Resp: clear to auscultation bilaterally Back: symmetric, no curvature. ROM normal. No CVA tenderness. Cardio: regular rate and rhythm, S1, S2 normal, no murmur, click, rub or gallop GI: soft, non-tender; bowel sounds normal; no masses,  no organomegaly Extremities: extremities normal, atraumatic, no cyanosis or edema  ECOG PERFORMANCE STATUS: 1 - Symptomatic but completely ambulatory  Blood pressure (!) 144/81, pulse 75, temperature 97.8 F (36.6 C), temperature source Tympanic, resp. rate 16, height 5' 11"  (1.803 m), weight 206 lb 4.8 oz (93.6 kg), SpO2 98 %.  LABORATORY DATA: Lab Results  Component Value Date   WBC 6.3 06/04/2022   HGB 12.3 (L) 06/04/2022   HCT 36.2 (L) 06/04/2022   MCV 90.7 06/04/2022    PLT 186 06/04/2022      Chemistry      Component Value Date/Time   NA 136 06/04/2022 0807   K 3.5 06/04/2022 0807   CL 104 06/04/2022 0807   CO2 21 (L) 06/04/2022 0807   BUN 33 (H) 06/04/2022 0807   CREATININE 1.46 (H) 06/04/2022 0807      Component Value Date/Time   CALCIUM 9.5 06/04/2022 0807   ALKPHOS 89 06/04/2022 0807   AST 27 06/04/2022 0807   ALT 31 06/04/2022 0807   BILITOT 0.3 06/04/2022 0807       RADIOGRAPHIC STUDIES: No results found.  ASSESSMENT AND PLAN: This is a very pleasant 71 years old white male diagnosed with a stage IIIa (T1c, N2, M0) non-small cell lung cancer, adenocarcinoma presented with left upper lobe lung nodule in addition to mediastinal lymphadenopathy diagnosed in December 2022.  The patient is status post left upper lobectomy with lymph node dissection.  The resection margins were negative for malignancy and there was no visceral pleural involvement but lymphovascular invasion. The patient has no actionable mutations and PD-L1 expression was 70% I recommended for the patient to proceed with adjuvant systemic chemotherapy for 4 cycles followed by adjuvant treatment with immunotherapy with atezolizumab.  Initially will consider The patient for treatment with cisplatin  and Alimta but because of his renal insufficiency, will change the adjuvant systemic chemotherapy to carboplatin for AUC of 6 and paclitaxel 200 Mg/M2 every 3 weeks with Neulasta support. He is status post 4 cycles.  Last dose was given on March 11, 2022. The patient has been tolerating this treatment fairly well with no concerning complaints except for the pain from the Neulasta injection. The patient is currently undergoing adjuvant treatment with immunotherapy with atezolizumab 1200 Mg IV every 3 weeks status post 2 cycles. The patient has been tolerating his treatment fairly well with no concerning adverse effects. I recommended for him to proceed with cycle #3 today as planned. I  will see him back for follow-up visit in 3 weeks for evaluation before starting cycle #4. For the neuropathic pain from the recent surgery, he was on treatment with Percocet by Dr. Kipp Brood.   He was advised to call immediately if he has any other concerning symptoms in the interval. The patient voices understanding of current disease status and treatment options and is in agreement with the current care plan.  All questions were answered. The patient knows to call the clinic with any problems, questions or concerns. We can certainly see the patient much sooner if necessary.   Disclaimer: This note was dictated with voice recognition software. Similar sounding words can inadvertently be transcribed and may not be corrected upon review.

## 2022-06-04 NOTE — Progress Notes (Signed)
Pt requested letter to excuse him from "court meeting"

## 2022-06-04 NOTE — Patient Instructions (Signed)
[facility]  Discharge Instructions: Thank you for choosing [facility] to provide your oncology and hematology care.   If you have a lab appointment with the [facility], please go directly to the [facility] and check in at the registration area.   Wear comfortable clothing and clothing appropriate for easy access to any Portacath or PICC line.   We strive to give you quality time with your provider. You may need to reschedule your appointment if you arrive late (15 or more minutes).  Arriving late affects you and other patients whose appointments are after yours.  Also, if you miss three or more appointments without notifying the office, you may be dismissed from the clinic at the provider's discretion.      For prescription refill requests, have your pharmacy contact our office and allow 72 hours for refills to be completed.    Today you received the following chemotherapy and/or immunotherapy agent: Atezolizumab (Tecentriq)   To help prevent nausea and vomiting after your treatment, we encourage you to take your nausea medication as directed.  BELOW ARE SYMPTOMS THAT SHOULD BE REPORTED IMMEDIATELY: *FEVER GREATER THAN 100.4 F (38 C) OR HIGHER *CHILLS OR SWEATING *NAUSEA AND VOMITING THAT IS NOT CONTROLLED WITH YOUR NAUSEA MEDICATION *UNUSUAL SHORTNESS OF BREATH *UNUSUAL BRUISING OR BLEEDING *URINARY PROBLEMS (pain or burning when urinating, or frequent urination) *BOWEL PROBLEMS (unusual diarrhea, constipation, pain near the anus) TENDERNESS IN MOUTH AND THROAT WITH OR WITHOUT PRESENCE OF ULCERS (sore throat, sores in mouth, or a toothache) UNUSUAL RASH, SWELLING OR PAIN  UNUSUAL VAGINAL DISCHARGE OR ITCHING   Items with * indicate a potential emergency and should be followed up as soon as possible or go to the Emergency Department if any problems should occur.  Please show the CHEMOTHERAPY ALERT CARD or IMMUNOTHERAPY ALERT CARD at  check-in to the Emergency Department and triage nurse.  Should you have questions after your visit or need to cancel or reschedule your appointment, please contact [facility]  Dept: [phone]  and follow the prompts.  Office hours are 8:00 a.m. to 4:30 p.m. Monday - Friday. Please note that voicemails left after 4:00 p.m. may not be returned until the following business day.  We are closed weekends and major holidays. You have access to a nurse at all times for urgent questions. Please call the main number to the clinic Dept: [phone] and follow the prompts.   For any non-urgent questions, you may also contact your provider using MyChart. We now offer e-Visits for anyone 8 and older to request care online for non-urgent symptoms. For details visit [email or web address].   Also download the MyChart app! Go to the app store, search "MyChart", open the app, select [facility], and log in with your MyChart username and password.  Masks are optional in the cancer centers. If you would like for your care team to wear a mask while they are taking care of you, please let them know. For doctor visits, patients may have with them one support person who is at least 70 years old. At this time, visitors are not allowed in the infusion area.

## 2022-06-21 NOTE — Progress Notes (Unsigned)
[facility] OFFICE PROGRESS NOTE  Associates, [facility] [place] [address]  DIAGNOSIS: Stage IIIA (T1c, N2, M0) non-small cell lung cancer, moderately differentiated adenocarcinoma diagnosed in December 2022.   PD-L1 expression is 70%.     Molecular study showed no actionable mutation.  It has only MAP2K1  PRIOR THERAPY: 1) Status post robotic assisted left video thoracoscopy with emergency thoracotomy and left upper lobectomy and mediastinal lymph node sampling under the care of Dr. Kipp Brood on November 15, 2021. 2) Adjuvant systemic chemotherapy with carboplatin for AUC of 6 and paclitaxel 200 Mg/M2 with Neulasta support every 3 weeks.  First dose January 08, 2022.  The patient was not eligible for treatment with cisplatin and Alimta because of renal insufficiency.  Status post 4 cycles.  Last dose was given on March 11, 2022.  CURRENT THERAPY: Adjuvant treatment with immunotherapy with atezolizumab 1200 Mg IV every 3 weeks.  First dose 04/23/2022.  Status post 3 cycles  INTERVAL HISTORY: [name] 71 y.o. male returns to the clinic today for follow-up visit.  Patient is currently undergoing treatment with adjuvant immunotherapy with Tecentriq.  He is status post 3 cycles.  Has been tolerating it fairly well.  The patient is endorsing some fatigue.  He recently saw Dr. Kipp Brood who recommended that he enroll in an exercise program.  Otherwise the patient denies any fever, chills, or night sweats.  Weight loss?  He has some left-sided chest discomfort over his surgical site.  He reports similar dyspnea on exertion.  Denies any cough or hemoptysis.  Denies any nausea or vomiting?  Denies any diarrhea or constipation.  Denies any rashes or skin changes.  Denies any headache or visual changes.  The patient is here today for evaluation and repeat blood work before starting cycle #4 of Tecentriq.    MEDICAL HISTORY: Past Medical History:   Diagnosis Date   Diabetes mellitus ([place])    Dyslipidemia    Hypertension     ALLERGIES:  has No Known Allergies.  MEDICATIONS:  Current Outpatient Medications  Medication Sig Dispense Refill   atenolol (TENORMIN) 100 MG tablet Take 100 mg by mouth daily.     atorvastatin (LIPITOR) 20 MG tablet Take 20 mg by mouth daily.     famotidine (PEPCID) 20 MG tablet Take 20 mg by mouth daily.     gabapentin (NEURONTIN) 300 MG capsule Take 1 capsule (300 mg total) by mouth 3 (three) times daily. 90 capsule 0   losartan-hydrochlorothiazide (HYZAAR) 100-12.5 MG tablet Take 1 tablet by mouth daily.     metFORMIN (GLUCOPHAGE-XR) 500 MG 24 hr tablet Take 1,000 mg by mouth every evening.     methocarbamol (ROBAXIN) 500 MG tablet Take 1 tablet (500 mg total) by mouth 3 (three) times daily. 90 tablet 0   prochlorperazine (COMPAZINE) 10 MG tablet Take 10 mg by mouth every 6 (six) hours as needed for nausea or vomiting. (Patient not taking: Reported on 05/15/2022)     No current facility-administered medications for this visit.    SURGICAL HISTORY:  Past Surgical History:  Procedure Laterality Date   BRONCHIAL BIOPSY  11/15/2021   Procedure: BRONCHIAL BIOPSIES;  Surgeon: Garner Nash, DO;  Location: [facility];  Service: Pulmonary;;   BRONCHIAL BRUSHINGS  11/15/2021   Procedure: BRONCHIAL BRUSHINGS;  Surgeon: Garner Nash, DO;  Location: [facility];  Service: Pulmonary;;   FIDUCIAL MARKER PLACEMENT  11/15/2021   Procedure: FIDUCIAL DYE MARKING;  Surgeon: June Leap  L, DO;  Location: [facility];  Service: Pulmonary;;   INGUINAL HERNIA REPAIR     INTERCOSTAL NERVE BLOCK Left 11/15/2021   Procedure: INTERCOSTAL NERVE BLOCK;  Surgeon: Lajuana Matte, MD;  Location: [facility];  Service: Thoracic;  Laterality: Left;   LOBECTOMY Left 11/15/2021   Procedure: LEFT UPPER LOBECTOMY;  Surgeon: Lajuana Matte, MD;  Location: [facility];  Service: Thoracic;  Laterality: Left;   MEDIASTINAL  EXPLORATION Left 11/15/2021   Procedure: MEDIASTINAL LYMPH NODE EXPLORATION;  Surgeon: Lajuana Matte, MD;  Location: [facility];  Service: Thoracic;  Laterality: Left;   NODE DISSECTION Left 11/15/2021   Procedure: NODE DISSECTION;  Surgeon: Lajuana Matte, MD;  Location: [facility];  Service: Thoracic;  Laterality: Left;   RIB PLATING Left 11/15/2021   Procedure: RIB PLATING;  Surgeon: Lajuana Matte, MD;  Location: [facility];  Service: Thoracic;  Laterality: Left;   THORACOTOMY/LOBECTOMY Left 11/15/2021   Procedure: EMERGENCY THORACOTOMY;  Surgeon: Lajuana Matte, MD;  Location: [facility];  Service: Thoracic;  Laterality: Left;   VIDEO BRONCHOSCOPY WITH RADIAL ENDOBRONCHIAL ULTRASOUND  11/15/2021   Procedure: VIDEO BRONCHOSCOPY WITH RADIAL ENDOBRONCHIAL ULTRASOUND;  Surgeon: Garner Nash, DO;  Location: [facility];  Service: Pulmonary;;    REVIEW OF SYSTEMS:   Review of Systems  Constitutional: Negative for appetite change, chills, fatigue, fever and unexpected weight change.  HENT:   Negative for mouth sores, nosebleeds, sore throat and trouble swallowing.   Eyes: Negative for eye problems and icterus.  Respiratory: Negative for cough, hemoptysis, shortness of breath and wheezing.   Cardiovascular: Negative for chest pain and leg swelling.  Gastrointestinal: Negative for abdominal pain, constipation, diarrhea, nausea and vomiting.  Genitourinary: Negative for bladder incontinence, difficulty urinating, dysuria, frequency and hematuria.   Musculoskeletal: Negative for back pain, gait problem, neck pain and neck stiffness.  Skin: Negative for itching and rash.  Neurological: Negative for dizziness, extremity weakness, gait problem, headaches, light-headedness and seizures.  Hematological: Negative for adenopathy. Does not bruise/bleed easily.  Psychiatric/Behavioral: Negative for confusion, depression and sleep disturbance. The patient is not nervous/anxious.     PHYSICAL  EXAMINATION:  There were no vitals taken for this visit.  ECOG PERFORMANCE STATUS: {CHL ONC ECOG [id number]  Physical Exam  Constitutional: Oriented to person, place, and time and well-developed, well-nourished, and in no distress. No distress.  HENT:  Head: Normocephalic and atraumatic.  Mouth/Throat: Oropharynx is clear and moist. No oropharyngeal exudate.  Eyes: Conjunctivae are normal. Right eye exhibits no discharge. Left eye exhibits no discharge. No scleral icterus.  Neck: Normal range of motion. Neck supple.  Cardiovascular: Normal rate, regular rhythm, normal heart sounds and intact distal pulses.   Pulmonary/Chest: Effort normal and breath sounds normal. No respiratory distress. No wheezes. No rales.  Abdominal: Soft. Bowel sounds are normal. Exhibits no distension and no mass. There is no tenderness.  Musculoskeletal: Normal range of motion. Exhibits no edema.  Lymphadenopathy:    No cervical adenopathy.  Neurological: Alert and oriented to person, place, and time. Exhibits normal muscle tone. Gait normal. Coordination normal.  Skin: Skin is warm and dry. No rash noted. Not diaphoretic. No erythema. No pallor.  Psychiatric: Mood, memory and judgment normal.  Vitals reviewed.  LABORATORY DATA: Lab Results  Component Value Date   WBC 6.3 06/04/2022   HGB 12.3 (L) 06/04/2022   HCT 36.2 (L) 06/04/2022   MCV 90.7 06/04/2022   PLT 186 06/04/2022      Chemistry  Component Value Date/Time   NA 136 06/04/2022 0807   K 3.5 06/04/2022 0807   CL 104 06/04/2022 0807   CO2 21 (L) 06/04/2022 0807   BUN 33 (H) 06/04/2022 0807   CREATININE 1.46 (H) 06/04/2022 0807      Component Value Date/Time   CALCIUM 9.5 06/04/2022 0807   ALKPHOS 89 06/04/2022 0807   AST 27 06/04/2022 0807   ALT 31 06/04/2022 0807   BILITOT 0.3 06/04/2022 0807       RADIOGRAPHIC STUDIES:  No results found.   ASSESSMENT/PLAN:  This is  a very pleasant 71 year old Caucasian male  diagnosed with stage IIIa (T1c, N2, M0) non-small cell lung cancer, adenocarcinoma.  The patient presented with a left upper lobe lung nodule in addition to mediastinal lymphadenopathy.  He was diagnosed in December 2022.  The patient's PD-L1 expression is 70%.  He has no actionable mutations.  The patient underwent a left upper lobectomy with lymph node dissection under the care of Dr. Kipp Brood.  The resection margins were negative for malignancy and there was no visceral pleural involvement but there was lymphovascular invasion.  Therefore the patient underwent adjuvant systemic chemotherapy with 4 cycles of carboplatin for an AUC of 6 and paclitaxel 200 mg per metered squared IV every 3 weeks with Neulasta support.  The patient is not a good candidate for cisplatin Alimta because of his renal insufficiency.  He completed this on 03/11/2022.  He tolerated this fairly well except for Neulasta associated arthralgias.  Because of his PD-L1 expression being greater than 70%, Dr. Julien Nordmann recommended adjuvant treatment with immunotherapy with Tecentriq 1200 mg IV every 3 weeks.  The patient is status post 3 cycles.  The patient was seen with Dr. Julien Nordmann today.  Labs were reviewed.  Recommend that he proceed with cycle #4 today scheduled.  We will see him back for follow-up visit in 3 weeks for evaluation and repeat blood work before starting cycle #5  The patient was advised to call immediately if he has any concerning symptoms in the interval. The patient voices understanding of current disease status and treatment options and is in agreement with the current care plan. All questions were answered. The patient knows to call the clinic with any problems, questions or concerns. We can certainly see the patient much sooner if necessary           No orders of the defined types were placed in this encounter.    I spent {CHL ONC TIME VISIT - OEHOZ:[id number]} counseling the patient face to face.  The total time spent in the appointment was {CHL ONC TIME VISIT - CWUGQ:[id number]}.  Arliene Rosenow L Alexica Schlossberg, PA-C 06/21/22

## 2022-06-26 ENCOUNTER — Other Ambulatory Visit: Payer: Self-pay

## 2022-06-26 ENCOUNTER — Ambulatory Visit: Payer: Medicare Other

## 2022-06-26 ENCOUNTER — Other Ambulatory Visit: Payer: Medicare Other

## 2022-06-26 ENCOUNTER — Inpatient Hospital Stay (HOSPITAL_BASED_OUTPATIENT_CLINIC_OR_DEPARTMENT_OTHER): Payer: Medicare Other | Admitting: Physician Assistant

## 2022-06-26 ENCOUNTER — Ambulatory Visit: Payer: Medicare Other | Admitting: Physician Assistant

## 2022-06-26 ENCOUNTER — Inpatient Hospital Stay: Payer: Medicare Other | Attending: Internal Medicine

## 2022-06-26 ENCOUNTER — Inpatient Hospital Stay: Payer: Medicare Other

## 2022-06-26 VITALS — BP 145/85 | HR 72 | Temp 97.5°F | Resp 18 | Ht 71.0 in | Wt 210.9 lb

## 2022-06-26 DIAGNOSIS — Z79899 Other long term (current) drug therapy: Secondary | ICD-10-CM | POA: Diagnosis not present

## 2022-06-26 DIAGNOSIS — Z5112 Encounter for antineoplastic immunotherapy: Secondary | ICD-10-CM | POA: Insufficient documentation

## 2022-06-26 DIAGNOSIS — C3492 Malignant neoplasm of unspecified part of left bronchus or lung: Secondary | ICD-10-CM

## 2022-06-26 DIAGNOSIS — K08109 Complete loss of teeth, unspecified cause, unspecified class: Secondary | ICD-10-CM

## 2022-06-26 DIAGNOSIS — I10 Essential (primary) hypertension: Secondary | ICD-10-CM | POA: Insufficient documentation

## 2022-06-26 DIAGNOSIS — E119 Type 2 diabetes mellitus without complications: Secondary | ICD-10-CM | POA: Diagnosis not present

## 2022-06-26 DIAGNOSIS — N289 Disorder of kidney and ureter, unspecified: Secondary | ICD-10-CM | POA: Diagnosis not present

## 2022-06-26 DIAGNOSIS — C3412 Malignant neoplasm of upper lobe, left bronchus or lung: Secondary | ICD-10-CM | POA: Insufficient documentation

## 2022-06-26 LAB — CMP (CANCER CENTER ONLY)
ALT: 15 U/L (ref 0–44)
AST: 17 U/L (ref 15–41)
Albumin: 4.2 g/dL (ref 3.5–5.0)
Alkaline Phosphatase: 63 U/L (ref 38–126)
Anion gap: 10 (ref 5–15)
BUN: 34 mg/dL — ABNORMAL HIGH (ref 8–23)
CO2: 22 mmol/L (ref 22–32)
Calcium: 9.2 mg/dL (ref 8.9–10.3)
Chloride: 106 mmol/L (ref 98–111)
Creatinine: 1.52 mg/dL — ABNORMAL HIGH (ref 0.61–1.24)
GFR, Estimated: 49 mL/min — ABNORMAL LOW (ref >60)
Glucose, Bld: 161 mg/dL — ABNORMAL HIGH (ref 70–99)
Potassium: 3.6 mmol/L (ref 3.5–5.1)
Sodium: 138 mmol/L (ref 135–145)
Total Bilirubin: 0.4 mg/dL (ref 0.3–1.2)
Total Protein: 7.5 g/dL (ref 6.5–8.1)

## 2022-06-26 LAB — CBC WITH DIFFERENTIAL (CANCER CENTER ONLY)
Abs Immature Granulocytes: 0.02 10*3/uL (ref 0.00–0.07)
Basophils Absolute: 0.1 10*3/uL (ref 0.0–0.1)
Basophils Relative: 1 %
Eosinophils Absolute: 0.3 10*3/uL (ref 0.0–0.5)
Eosinophils Relative: 3 %
HCT: 34 % — ABNORMAL LOW (ref 39.0–52.0)
Hemoglobin: 11.3 g/dL — ABNORMAL LOW (ref 13.0–17.0)
Immature Granulocytes: 0 %
Lymphocytes Relative: 51 %
Lymphs Abs: 4 10*3/uL (ref 0.7–4.0)
MCH: 29.4 pg (ref 26.0–34.0)
MCHC: 33.2 g/dL (ref 30.0–36.0)
MCV: 88.5 fL (ref 80.0–100.0)
Monocytes Absolute: 0.9 10*3/uL (ref 0.1–1.0)
Monocytes Relative: 12 %
Neutro Abs: 2.6 10*3/uL (ref 1.7–7.7)
Neutrophils Relative %: 33 %
Platelet Count: 201 10*3/uL (ref 150–400)
RBC: 3.84 MIL/uL — ABNORMAL LOW (ref 4.22–5.81)
RDW: 13.7 % (ref 11.5–15.5)
WBC Count: 7.8 10*3/uL (ref 4.0–10.5)
nRBC: 0 % (ref 0.0–0.2)

## 2022-06-26 LAB — TSH: TSH: 2.241 u[IU]/mL (ref 0.350–4.500)

## 2022-06-26 MED ORDER — SODIUM CHLORIDE 0.9 % IV SOLN: Freq: Once | INTRAVENOUS | Status: AC

## 2022-06-26 MED ORDER — SODIUM CHLORIDE 0.9 % IV SOLN
1200.0000 mg | Freq: Once | INTRAVENOUS | Status: AC
  Administered 2022-06-26: 1200 mg via INTRAVENOUS
  Filled 2022-06-26: qty 20

## 2022-06-26 NOTE — Progress Notes (Signed)
Per Cassie, okay to proceed with treatment with elevated creatinine level.

## 2022-06-26 NOTE — Patient Instructions (Signed)
[facility]  Discharge Instructions: Thank you for choosing [facility] to provide your oncology and hematology care.   If you have a lab appointment with the [facility], please go directly to the [facility] and check in at the registration area.   Wear comfortable clothing and clothing appropriate for easy access to any Portacath or PICC line.   We strive to give you quality time with your provider. You may need to reschedule your appointment if you arrive late (15 or more minutes).  Arriving late affects you and other patients whose appointments are after yours.  Also, if you miss three or more appointments without notifying the office, you may be dismissed from the clinic at the provider's discretion.      For prescription refill requests, have your pharmacy contact our office and allow 72 hours for refills to be completed.    Today you received the following chemotherapy and/or immunotherapy agents: Tecentriq      To help prevent nausea and vomiting after your treatment, we encourage you to take your nausea medication as directed.  BELOW ARE SYMPTOMS THAT SHOULD BE REPORTED IMMEDIATELY: *FEVER GREATER THAN 100.4 F (38 C) OR HIGHER *CHILLS OR SWEATING *NAUSEA AND VOMITING THAT IS NOT CONTROLLED WITH YOUR NAUSEA MEDICATION *UNUSUAL SHORTNESS OF BREATH *UNUSUAL BRUISING OR BLEEDING *URINARY PROBLEMS (pain or burning when urinating, or frequent urination) *BOWEL PROBLEMS (unusual diarrhea, constipation, pain near the anus) TENDERNESS IN MOUTH AND THROAT WITH OR WITHOUT PRESENCE OF ULCERS (sore throat, sores in mouth, or a toothache) UNUSUAL RASH, SWELLING OR PAIN  UNUSUAL VAGINAL DISCHARGE OR ITCHING   Items with * indicate a potential emergency and should be followed up as soon as possible or go to the Emergency Department if any problems should occur.  Please show the CHEMOTHERAPY ALERT CARD or IMMUNOTHERAPY ALERT CARD at check-in to  the Emergency Department and triage nurse.  Should you have questions after your visit or need to cancel or reschedule your appointment, please contact [facility]  Dept: [phone]  and follow the prompts.  Office hours are 8:00 a.m. to 4:30 p.m. Monday - Friday. Please note that voicemails left after 4:00 p.m. may not be returned until the following business day.  We are closed weekends and major holidays. You have access to a nurse at all times for urgent questions. Please call the main number to the clinic Dept: [phone] and follow the prompts.   For any non-urgent questions, you may also contact your provider using MyChart. We now offer e-Visits for anyone 70 and older to request care online for non-urgent symptoms. For details visit [email or web address].   Also download the MyChart app! Go to the app store, search "MyChart", open the app, select [facility], and log in with your MyChart username and password.  Masks are optional in the cancer centers. If you would like for your care team to wear a mask while they are taking care of you, please let them know. For doctor visits, patients may have with them one support person who is at least 70 years old. At this time, visitors are not allowed in the infusion area.

## 2022-07-04 ENCOUNTER — Telehealth: Payer: Self-pay

## 2022-07-04 ENCOUNTER — Encounter: Payer: Self-pay | Admitting: Internal Medicine

## 2022-07-04 NOTE — Telephone Encounter (Signed)
Spoke with patient in response to request for dental needs. Patient has not heard back from Dr. Darrol Jump at [facility]. Provided pt with Frederik Schmidt DDS office number [phone] if patient needs assistance sooner.

## 2022-07-08 ENCOUNTER — Other Ambulatory Visit: Payer: Self-pay

## 2022-07-11 ENCOUNTER — Other Ambulatory Visit: Payer: Self-pay

## 2022-07-16 ENCOUNTER — Other Ambulatory Visit: Payer: Self-pay

## 2022-07-17 ENCOUNTER — Inpatient Hospital Stay: Payer: Medicare Other

## 2022-07-17 ENCOUNTER — Ambulatory Visit: Payer: Medicare Other

## 2022-07-17 ENCOUNTER — Other Ambulatory Visit: Payer: Self-pay

## 2022-07-17 ENCOUNTER — Inpatient Hospital Stay: Payer: Medicare Other | Attending: Internal Medicine

## 2022-07-17 ENCOUNTER — Inpatient Hospital Stay (HOSPITAL_BASED_OUTPATIENT_CLINIC_OR_DEPARTMENT_OTHER): Payer: Medicare Other | Admitting: Internal Medicine

## 2022-07-17 ENCOUNTER — Ambulatory Visit: Payer: Medicare Other | Admitting: Internal Medicine

## 2022-07-17 ENCOUNTER — Other Ambulatory Visit: Payer: Medicare Other

## 2022-07-17 VITALS — BP 131/77 | HR 70 | Temp 98.2°F | Resp 17 | Wt 212.3 lb

## 2022-07-17 VITALS — BP 121/69 | HR 61 | Resp 17

## 2022-07-17 DIAGNOSIS — C3492 Malignant neoplasm of unspecified part of left bronchus or lung: Secondary | ICD-10-CM | POA: Diagnosis not present

## 2022-07-17 DIAGNOSIS — Z5112 Encounter for antineoplastic immunotherapy: Secondary | ICD-10-CM | POA: Insufficient documentation

## 2022-07-17 DIAGNOSIS — C3412 Malignant neoplasm of upper lobe, left bronchus or lung: Secondary | ICD-10-CM | POA: Diagnosis present

## 2022-07-17 DIAGNOSIS — Z79899 Other long term (current) drug therapy: Secondary | ICD-10-CM | POA: Diagnosis not present

## 2022-07-17 DIAGNOSIS — E119 Type 2 diabetes mellitus without complications: Secondary | ICD-10-CM | POA: Insufficient documentation

## 2022-07-17 DIAGNOSIS — I10 Essential (primary) hypertension: Secondary | ICD-10-CM | POA: Diagnosis not present

## 2022-07-17 DIAGNOSIS — N289 Disorder of kidney and ureter, unspecified: Secondary | ICD-10-CM | POA: Insufficient documentation

## 2022-07-17 LAB — CBC WITH DIFFERENTIAL (CANCER CENTER ONLY)
Abs Immature Granulocytes: 0.03 10*3/uL (ref 0.00–0.07)
Basophils Absolute: 0.1 10*3/uL (ref 0.0–0.1)
Basophils Relative: 1 %
Eosinophils Absolute: 0.3 10*3/uL (ref 0.0–0.5)
Eosinophils Relative: 3 %
HCT: 39.4 % (ref 39.0–52.0)
Hemoglobin: 13 g/dL (ref 13.0–17.0)
Immature Granulocytes: 0 %
Lymphocytes Relative: 45 %
Lymphs Abs: 3.8 10*3/uL (ref 0.7–4.0)
MCH: 28.9 pg (ref 26.0–34.0)
MCHC: 33 g/dL (ref 30.0–36.0)
MCV: 87.6 fL (ref 80.0–100.0)
Monocytes Absolute: 0.8 10*3/uL (ref 0.1–1.0)
Monocytes Relative: 10 %
Neutro Abs: 3.5 10*3/uL (ref 1.7–7.7)
Neutrophils Relative %: 41 %
Platelet Count: 176 10*3/uL (ref 150–400)
RBC: 4.5 MIL/uL (ref 4.22–5.81)
RDW: 14 % (ref 11.5–15.5)
WBC Count: 8.5 10*3/uL (ref 4.0–10.5)
nRBC: 0 % (ref 0.0–0.2)

## 2022-07-17 LAB — CMP (CANCER CENTER ONLY)
ALT: 20 U/L (ref 0–44)
AST: 25 U/L (ref 15–41)
Albumin: 4.5 g/dL (ref 3.5–5.0)
Alkaline Phosphatase: 67 U/L (ref 38–126)
Anion gap: 11 (ref 5–15)
BUN: 35 mg/dL — ABNORMAL HIGH (ref 8–23)
CO2: 23 mmol/L (ref 22–32)
Calcium: 9.6 mg/dL (ref 8.9–10.3)
Chloride: 102 mmol/L (ref 98–111)
Creatinine: 1.82 mg/dL — ABNORMAL HIGH (ref 0.61–1.24)
GFR, Estimated: 39 mL/min — ABNORMAL LOW (ref >60)
Glucose, Bld: 178 mg/dL — ABNORMAL HIGH (ref 70–99)
Potassium: 3.7 mmol/L (ref 3.5–5.1)
Sodium: 136 mmol/L (ref 135–145)
Total Bilirubin: 0.4 mg/dL (ref 0.3–1.2)
Total Protein: 7.9 g/dL (ref 6.5–8.1)

## 2022-07-17 LAB — TSH: TSH: 3.169 u[IU]/mL (ref 0.350–4.500)

## 2022-07-17 MED ORDER — SODIUM CHLORIDE 0.9 % IV SOLN
1200.0000 mg | Freq: Once | INTRAVENOUS | Status: AC
  Administered 2022-07-17: 1200 mg via INTRAVENOUS
  Filled 2022-07-17: qty 20

## 2022-07-17 MED ORDER — SODIUM CHLORIDE 0.9 % IV SOLN: Freq: Once | INTRAVENOUS | Status: AC

## 2022-07-17 NOTE — Patient Instructions (Signed)
[facility]  Discharge Instructions: Thank you for choosing [facility] to provide your oncology and hematology care.   If you have a lab appointment with the [facility], please go directly to the [facility] and check in at the registration area.   Wear comfortable clothing and clothing appropriate for easy access to any Portacath or PICC line.   We strive to give you quality time with your provider. You may need to reschedule your appointment if you arrive late (15 or more minutes).  Arriving late affects you and other patients whose appointments are after yours.  Also, if you miss three or more appointments without notifying the office, you may be dismissed from the clinic at the provider's discretion.      For prescription refill requests, have your pharmacy contact our office and allow 72 hours for refills to be completed.    Today you received the following chemotherapy and/or immunotherapy agents: atezolizumab      To help prevent nausea and vomiting after your treatment, we encourage you to take your nausea medication as directed.  BELOW ARE SYMPTOMS THAT SHOULD BE REPORTED IMMEDIATELY: *FEVER GREATER THAN 100.4 F (38 C) OR HIGHER *CHILLS OR SWEATING *NAUSEA AND VOMITING THAT IS NOT CONTROLLED WITH YOUR NAUSEA MEDICATION *UNUSUAL SHORTNESS OF BREATH *UNUSUAL BRUISING OR BLEEDING *URINARY PROBLEMS (pain or burning when urinating, or frequent urination) *BOWEL PROBLEMS (unusual diarrhea, constipation, pain near the anus) TENDERNESS IN MOUTH AND THROAT WITH OR WITHOUT PRESENCE OF ULCERS (sore throat, sores in mouth, or a toothache) UNUSUAL RASH, SWELLING OR PAIN  UNUSUAL VAGINAL DISCHARGE OR ITCHING   Items with * indicate a potential emergency and should be followed up as soon as possible or go to the Emergency Department if any problems should occur.  Please show the CHEMOTHERAPY ALERT CARD or IMMUNOTHERAPY ALERT CARD at check-in  to the Emergency Department and triage nurse.  Should you have questions after your visit or need to cancel or reschedule your appointment, please contact [facility]  Dept: [phone]  and follow the prompts.  Office hours are 8:00 a.m. to 4:30 p.m. Monday - Friday. Please note that voicemails left after 4:00 p.m. may not be returned until the following business day.  We are closed weekends and major holidays. You have access to a nurse at all times for urgent questions. Please call the main number to the clinic Dept: [phone] and follow the prompts.   For any non-urgent questions, you may also contact your provider using MyChart. We now offer e-Visits for anyone 78 and older to request care online for non-urgent symptoms. For details visit [email or web address].   Also download the MyChart app! Go to the app store, search "MyChart", open the app, select [facility], and log in with your MyChart username and password.  Masks are optional in the cancer centers. If you would like for your care team to wear a mask while they are taking care of you, please let them know. You may have one support person who is at least 70 years old accompany you for your appointments.

## 2022-07-17 NOTE — Progress Notes (Signed)
[facility] [phone]   [phone]  OFFICE PROGRESS NOTE  [facility], [facility] [place] [address]  DIAGNOSIS: Stage IIIA (T1c, N2, M0) non-small cell lung cancer, moderately differentiated adenocarcinoma diagnosed in December 2022.  PD-L1 expression is 70%.    Molecular study showed no actionable mutation.  It has only MAP2K1  PRIOR THERAPY:  1) Status post robotic assisted left video thoracoscopy with emergency thoracotomy and left upper lobectomy and mediastinal lymph node sampling under the care of Dr. Kipp Brood on November 15, 2021. 2) Adjuvant systemic chemotherapy with carboplatin for AUC of 6 and paclitaxel 200 Mg/M2 with Neulasta support every 3 weeks.  First dose January 08, 2022.  The patient was not eligible for treatment with cisplatin and Alimta because of renal insufficiency.  Status post 4 cycles.  Last dose was given on March 11, 2022.  CURRENT THERAPY: Adjuvant treatment with immunotherapy with atezolizumab 1200 Mg IV every 3 weeks.  First dose 04/23/2022.  Status post 4 cycles.  INTERVAL HISTORY: [name] 71 y.o. male returns to the clinic today for follow-up visit.  The patient is feeling fine today with no concerning complaints except for shortness of breath with exertion.  He has some dental issues and he was referred to the dentist at the [facility] but they declined his referral.  He was asked to see an outside dentist.  The patient denied having any current chest pain, cough or hemoptysis.  He has no nausea, vomiting, diarrhea or constipation.  He has no headache or visual changes.  He has no recent weight loss or night sweats.  He continues to tolerate his adjuvant treatment with atezolizumab fairly well.  He is here today for evaluation before starting cycle #5.  MEDICAL HISTORY: Past Medical History:  Diagnosis Date   Diabetes mellitus ([place])    Dyslipidemia     Hypertension     ALLERGIES:  has No Known Allergies.  MEDICATIONS:  Current Outpatient Medications  Medication Sig Dispense Refill   atenolol (TENORMIN) 100 MG tablet Take 100 mg by mouth daily.     atorvastatin (LIPITOR) 20 MG tablet Take 20 mg by mouth daily.     famotidine (PEPCID) 20 MG tablet Take 20 mg by mouth daily.     gabapentin (NEURONTIN) 300 MG capsule Take 1 capsule (300 mg total) by mouth 3 (three) times daily. 90 capsule 0   losartan-hydrochlorothiazide (HYZAAR) 100-12.5 MG tablet Take 1 tablet by mouth daily.     metFORMIN (GLUCOPHAGE-XR) 500 MG 24 hr tablet Take 1,000 mg by mouth every evening.     methocarbamol (ROBAXIN) 500 MG tablet Take 1 tablet (500 mg total) by mouth 3 (three) times daily. 90 tablet 0   prochlorperazine (COMPAZINE) 10 MG tablet Take 10 mg by mouth every 6 (six) hours as needed for nausea or vomiting. (Patient not taking: Reported on 05/15/2022)     No current facility-administered medications for this visit.    SURGICAL HISTORY:  Past Surgical History:  Procedure Laterality Date   BRONCHIAL BIOPSY  11/15/2021   Procedure: BRONCHIAL BIOPSIES;  Surgeon: Garner Nash, DO;  Location: [facility];  Service: Pulmonary;;   BRONCHIAL BRUSHINGS  11/15/2021   Procedure: BRONCHIAL BRUSHINGS;  Surgeon: Garner Nash, DO;  Location: [facility];  Service: Pulmonary;;   FIDUCIAL MARKER PLACEMENT  11/15/2021   Procedure: FIDUCIAL DYE MARKING;  Surgeon: Garner Nash, DO;  Location: [facility];  Service: Pulmonary;;  INGUINAL HERNIA REPAIR     INTERCOSTAL NERVE BLOCK Left 11/15/2021   Procedure: INTERCOSTAL NERVE BLOCK;  Surgeon: Lajuana Matte, MD;  Location: [facility];  Service: Thoracic;  Laterality: Left;   LOBECTOMY Left 11/15/2021   Procedure: LEFT UPPER LOBECTOMY;  Surgeon: Lajuana Matte, MD;  Location: [facility];  Service: Thoracic;  Laterality: Left;   MEDIASTINAL EXPLORATION Left 11/15/2021   Procedure: MEDIASTINAL LYMPH NODE  EXPLORATION;  Surgeon: Lajuana Matte, MD;  Location: [facility];  Service: Thoracic;  Laterality: Left;   NODE DISSECTION Left 11/15/2021   Procedure: NODE DISSECTION;  Surgeon: Lajuana Matte, MD;  Location: [facility];  Service: Thoracic;  Laterality: Left;   RIB PLATING Left 11/15/2021   Procedure: RIB PLATING;  Surgeon: Lajuana Matte, MD;  Location: [facility];  Service: Thoracic;  Laterality: Left;   THORACOTOMY/LOBECTOMY Left 11/15/2021   Procedure: EMERGENCY THORACOTOMY;  Surgeon: Lajuana Matte, MD;  Location: [facility];  Service: Thoracic;  Laterality: Left;   VIDEO BRONCHOSCOPY WITH RADIAL ENDOBRONCHIAL ULTRASOUND  11/15/2021   Procedure: VIDEO BRONCHOSCOPY WITH RADIAL ENDOBRONCHIAL ULTRASOUND;  Surgeon: Garner Nash, DO;  Location: [facility];  Service: Pulmonary;;    REVIEW OF SYSTEMS:  A comprehensive review of systems was negative except for: Respiratory: positive for dyspnea on exertion   PHYSICAL EXAMINATION: General appearance: alert, cooperative, and no distress Head: Normocephalic, without obvious abnormality, atraumatic Neck: no adenopathy, no JVD, supple, symmetrical, trachea midline, and thyroid not enlarged, symmetric, no tenderness/mass/nodules Lymph nodes: Cervical, supraclavicular, and axillary nodes normal. Resp: clear to auscultation bilaterally Back: symmetric, no curvature. ROM normal. No CVA tenderness. Cardio: regular rate and rhythm, S1, S2 normal, no murmur, click, rub or gallop GI: soft, non-tender; bowel sounds normal; no masses,  no organomegaly Extremities: extremities normal, atraumatic, no cyanosis or edema  ECOG PERFORMANCE STATUS: 1 - Symptomatic but completely ambulatory  Blood pressure 131/77, pulse 70, temperature 98.2 F (36.8 C), resp. rate 17, weight 212 lb 5 oz (96.3 kg), SpO2 99 %.  LABORATORY DATA: Lab Results  Component Value Date   WBC 7.8 06/26/2022   HGB 11.3 (L) 06/26/2022   HCT 34.0 (L) 06/26/2022   MCV 88.5  06/26/2022   PLT 201 06/26/2022      Chemistry      Component Value Date/Time   NA 138 06/26/2022 0809   K 3.6 06/26/2022 0809   CL 106 06/26/2022 0809   CO2 22 06/26/2022 0809   BUN 34 (H) 06/26/2022 0809   CREATININE 1.52 (H) 06/26/2022 0809      Component Value Date/Time   CALCIUM 9.2 06/26/2022 0809   ALKPHOS 63 06/26/2022 0809   AST 17 06/26/2022 0809   ALT 15 06/26/2022 0809   BILITOT 0.4 06/26/2022 0809       RADIOGRAPHIC STUDIES: No results found.  ASSESSMENT AND PLAN: This is a very pleasant 71 years old white male diagnosed with a stage IIIa (T1c, N2, M0) non-small cell lung cancer, adenocarcinoma presented with left upper lobe lung nodule in addition to mediastinal lymphadenopathy diagnosed in December 2022.  The patient is status post left upper lobectomy with lymph node dissection.  The resection margins were negative for malignancy and there was no visceral pleural involvement but lymphovascular invasion. The patient has no actionable mutations and PD-L1 expression was 70% I recommended for the patient to proceed with adjuvant systemic chemotherapy for 4 cycles followed by adjuvant treatment with immunotherapy with atezolizumab.  Initially will consider The patient for treatment  with cisplatin and Alimta but because of his renal insufficiency, will change the adjuvant systemic chemotherapy to carboplatin for AUC of 6 and paclitaxel 200 Mg/M2 every 3 weeks with Neulasta support. He is status post 4 cycles.  Last dose was given on March 11, 2022. The patient has been tolerating this treatment fairly well with no concerning complaints except for the pain from the Neulasta injection. The patient is currently undergoing adjuvant treatment with immunotherapy with atezolizumab 1200 Mg IV every 3 weeks status post 4 cycles. The patient has been tolerating this treatment well with no concerning adverse effects. I recommended for him to proceed with cycle #5 today as  planned. For the dental problem, he will try to find an outside dentist for evaluation of his condition since he was not able to get an appointment with the dentist at the [facility]. The patient was advised to call immediately if he has any other concerning symptoms in the interval.  The patient voices understanding of current disease status and treatment options and is in agreement with the current care plan.  All questions were answered. The patient knows to call the clinic with any problems, questions or concerns. We can certainly see the patient much sooner if necessary.   Disclaimer: This note was dictated with voice recognition software. Similar sounding words can inadvertently be transcribed and may not be corrected upon review.

## 2022-07-17 NOTE — Progress Notes (Signed)
Ok to treat with creat 1.82 mg/dL per Dr Julien Nordmann

## 2022-07-18 ENCOUNTER — Other Ambulatory Visit: Payer: Self-pay

## 2022-07-26 ENCOUNTER — Other Ambulatory Visit: Payer: Self-pay

## 2022-07-28 ENCOUNTER — Other Ambulatory Visit: Payer: Self-pay | Admitting: Thoracic Surgery (Cardiothoracic Vascular Surgery)

## 2022-08-02 NOTE — Progress Notes (Unsigned)
[facility] OFFICE PROGRESS NOTE  Associates, [facility] [place] [place] [address]  DIAGNOSIS: Stage IIIA (T1c, N2, M0) non-small cell lung cancer, moderately differentiated adenocarcinoma diagnosed in December 2022.   PD-L1 expression is 70%.    Molecular study showed no actionable mutation.  It has only MAP2K1   PRIOR THERAPY: 1) Status post robotic assisted left video thoracoscopy with emergency thoracotomy and left upper lobectomy and mediastinal lymph node sampling under the care of Dr. Kipp Brood on November 15, 2021. 2) Adjuvant systemic chemotherapy with carboplatin for AUC of 6 and paclitaxel 200 Mg/M2 with Neulasta support every 3 weeks.  First dose January 08, 2022.  The patient was not eligible for treatment with cisplatin and Alimta because of renal insufficiency.  Status post 4 cycles.  Last dose was given on March 11, 2022.  CURRENT THERAPY:  Adjuvant treatment with immunotherapy with atezolizumab 1200 Mg IV every 3 weeks.  First dose 04/23/2022.  Status post 5 cycles  INTERVAL HISTORY: [name] 71 y.o. male returns to the clinic today for a follow-up visit.  Patient is currently undergoing treatment with adjuvant immunotherapy with Tecentriq.  He is status post 5 cycles.  Has been tolerating it fairly well.  He denies any new concerning complaints today.  The patient has stable fatigue and decreased exercise tolerance.  He recently saw Dr. Kipp Brood who recommended that he enroll in an exercise program.  I gave the patient the number to their clinic to reschedule his last appointment with me.  He still has not called them at this time. He denies any fever or chills.  He reports a good appetite and gained weight since last being seen.  He has some left-sided chest discomfort over the surgical site for which he is prescribed Robaxin.  Sometimes the gabapentin makes him feel dizzy so he does not take that often.  He reports stable  baseline dyspnea on exertion. Sometimes it is harder to breath in the heat, such as taking a shower. Denies significant cough unless he has phlegm in his throat he needs to clear.  Denies hemoptysis.  Denies any nausea or vomiting.  Denies any diarrhea or constipation.  He denies any headache or visual changes.  He denies any rashes or skin change. The patient worked in [employer]. He is requesting additional work excuse for the next 3 months. He is here today for evaluation and repeat blood work before undergoing cycle #6.   MEDICAL HISTORY: Past Medical History:  Diagnosis Date   Diabetes mellitus ([place])    Dyslipidemia    Hypertension     ALLERGIES:  has No Known Allergies.  MEDICATIONS:  Current Outpatient Medications  Medication Sig Dispense Refill   atenolol (TENORMIN) 100 MG tablet Take 100 mg by mouth daily.     atorvastatin (LIPITOR) 20 MG tablet Take 20 mg by mouth daily.     famotidine (PEPCID) 20 MG tablet Take 20 mg by mouth daily.     gabapentin (NEURONTIN) 300 MG capsule Take 1 capsule (300 mg total) by mouth 3 (three) times daily. 90 capsule 0   losartan-hydrochlorothiazide (HYZAAR) 100-12.5 MG tablet Take 1 tablet by mouth daily.     metFORMIN (GLUCOPHAGE-XR) 500 MG 24 hr tablet Take 1,000 mg by mouth every evening.     methocarbamol (ROBAXIN) 500 MG tablet Take 1 tablet (500 mg total) by mouth 3 (three) times daily. 90 tablet 0   prochlorperazine (COMPAZINE) 10 MG tablet Take 10 mg  by mouth every 6 (six) hours as needed for nausea or vomiting. (Patient not taking: Reported on 05/15/2022)     No current facility-administered medications for this visit.    SURGICAL HISTORY:  Past Surgical History:  Procedure Laterality Date   BRONCHIAL BIOPSY  11/15/2021   Procedure: BRONCHIAL BIOPSIES;  Surgeon: Garner Nash, DO;  Location: [place];  Service: Pulmonary;;   BRONCHIAL BRUSHINGS  11/15/2021   Procedure: BRONCHIAL BRUSHINGS;  Surgeon: Garner Nash, DO;   Location: [facility];  Service: Pulmonary;;   FIDUCIAL MARKER PLACEMENT  11/15/2021   Procedure: FIDUCIAL DYE MARKING;  Surgeon: Garner Nash, DO;  Location: [place] [facility];  Service: Pulmonary;;   INGUINAL HERNIA REPAIR     INTERCOSTAL NERVE BLOCK Left 11/15/2021   Procedure: INTERCOSTAL NERVE BLOCK;  Surgeon: Lajuana Matte, MD;  Location: [place];  Service: Thoracic;  Laterality: Left;   LOBECTOMY Left 11/15/2021   Procedure: LEFT UPPER LOBECTOMY;  Surgeon: Lajuana Matte, MD;  Location: [place];  Service: Thoracic;  Laterality: Left;   MEDIASTINAL EXPLORATION Left 11/15/2021   Procedure: MEDIASTINAL LYMPH NODE EXPLORATION;  Surgeon: Lajuana Matte, MD;  Location: [place];  Service: Thoracic;  Laterality: Left;   NODE DISSECTION Left 11/15/2021   Procedure: NODE DISSECTION;  Surgeon: Lajuana Matte, MD;  Location: [place];  Service: Thoracic;  Laterality: Left;   RIB PLATING Left 11/15/2021   Procedure: RIB PLATING;  Surgeon: Lajuana Matte, MD;  Location: [place];  Service: Thoracic;  Laterality: Left;   THORACOTOMY/LOBECTOMY Left 11/15/2021   Procedure: EMERGENCY THORACOTOMY;  Surgeon: Lajuana Matte, MD;  Location: [place];  Service: Thoracic;  Laterality: Left;   VIDEO BRONCHOSCOPY WITH RADIAL ENDOBRONCHIAL ULTRASOUND  11/15/2021   Procedure: VIDEO BRONCHOSCOPY WITH RADIAL ENDOBRONCHIAL ULTRASOUND;  Surgeon: Garner Nash, DO;  Location: [place] [facility];  Service: Pulmonary;;    REVIEW OF SYSTEMS:   Review of Systems  Constitutional: Positive for baseline fatigue. Negative for appetite change, chills, fever and unexpected weight change.  HENT: Negative for mouth sores, nosebleeds, sore throat and trouble swallowing.   Eyes: Negative for eye problems and icterus.  Respiratory: Positive for some occasional dyspnea on exertion/decreased exercise tolerance. Negative for cough, hemoptysis, and wheezing.   Cardiovascular: Positive for some occasional pain over surgery  site on chest. Negative for leg swelling.  Gastrointestinal: Negative for abdominal pain, constipation (none at this time), diarrhea, nausea and vomiting.  Genitourinary: Negative for bladder incontinence, difficulty urinating, dysuria, frequency and hematuria.   Musculoskeletal: Negative for back pain, gait problem, neck pain and neck stiffness.  Skin: Negative for rash or itching.  Neurological: Negative for dizziness (none at this time, only occasionally with gabapentin), extremity weakness, gait problem, headaches, light-headedness and seizures.  Hematological: Negative for adenopathy. Does not bruise/bleed easily.  Psychiatric/Behavioral: Negative for confusion, depression and sleep disturbance. The patient is not nervous/anxious.   PHYSICAL EXAMINATION:  Blood pressure (!) 154/85, pulse 60, temperature 97.7 F (36.5 C), temperature source Oral, resp. rate 14, weight 219 lb 11.2 oz (99.7 kg), SpO2 99 %.  ECOG PERFORMANCE STATUS: 1  Physical Exam  Constitutional: Oriented to person, place, and time and well-developed, well-nourished, and in no distress. HENT:  Head: Normocephalic and atraumatic.  Mouth/Throat: Oropharynx is clear and moist. No oropharyngeal exudate.  Eyes: Conjunctivae are normal. Right eye exhibits no discharge. Left eye exhibits no discharge. No scleral icterus.  Neck: Normal range of motion. Neck supple.  Cardiovascular: Normal rate, regular rhythm, normal  heart sounds and intact distal pulses.   Pulmonary/Chest: Effort normal and breath sounds normal. No respiratory distress. No wheezes. No rales.  Abdominal: Soft. Bowel sounds are normal. Exhibits no distension and no mass. There is no tenderness.  Musculoskeletal: Normal range of motion. Exhibits no edema.  Lymphadenopathy:    No cervical adenopathy.  Neurological: Alert and oriented to person, place, and time. Exhibits normal muscle tone. Gait normal. Coordination normal.  Skin: Skin is warm and dry. No rash  noted. Not diaphoretic. No erythema. No pallor.  Psychiatric: Mood, memory and judgment normal.  Vitals reviewed.  LABORATORY DATA: Lab Results  Component Value Date   WBC 8.2 08/07/2022   HGB 11.9 (L) 08/07/2022   HCT 35.6 (L) 08/07/2022   MCV 85.8 08/07/2022   PLT 185 08/07/2022      Chemistry      Component Value Date/Time   NA 136 07/17/2022 0803   K 3.7 07/17/2022 0803   CL 102 07/17/2022 0803   CO2 23 07/17/2022 0803   BUN 35 (H) 07/17/2022 0803   CREATININE 1.82 (H) 07/17/2022 0803      Component Value Date/Time   CALCIUM 9.6 07/17/2022 0803   ALKPHOS 67 07/17/2022 0803   AST 25 07/17/2022 0803   ALT 20 07/17/2022 0803   BILITOT 0.4 07/17/2022 0803       RADIOGRAPHIC STUDIES:  No results found.   ASSESSMENT/PLAN:  This is  a very pleasant 71 year old Caucasian male diagnosed with stage IIIa (T1c, N2, M0) non-small cell lung cancer, adenocarcinoma.  The patient presented with a left upper lobe lung nodule in addition to mediastinal lymphadenopathy.  He was diagnosed in December 2022.  The patient's PD-L1 expression is 70%.  He has no actionable mutations.  The patient underwent a left upper lobectomy with lymph node dissection under the care of Dr. Kipp Brood.  The resection margins were negative for malignancy and there was no visceral pleural involvement but there was lymphovascular invasion.  Therefore the patient underwent adjuvant systemic chemotherapy with 4 cycles of carboplatin for an AUC of 6 and paclitaxel 200 mg per metered squared IV every 3 weeks with Neulasta support.  The patient is not a good candidate for cisplatin Alimta because of his renal insufficiency.  He completed this on 03/11/2022.  He tolerated this fairly well except for Neulasta associated arthralgias.  Because of his PD-L1 expression being greater than 70%, Dr. Julien Nordmann recommended adjuvant treatment with immunotherapy with Tecentriq 1200 mg IV every 3 weeks.  The patient is status post 5  cycles.    Labs were reviewed.  Recommend that he proceed with cycle #6 today scheduled.  We will see him back for follow-up visit in 3 weeks for evaluation and repeat blood work before starting cycle #7   I will arrange for restaging CT scan of the chest prior to starting his next cycle of treatment.  Discussed with Dr. Julien Nordmann, from our standpoint there is no reason he needs to be off work continuously for the next 3 months.  Of course would recommend light duty accommodations as part of his ongoing treatment associated side effects.  We will write him a work note requesting light duty.  I will also place a referral to the social worker to see if there is any social programs/financial he can enroll in.   The patient was advised to call immediately if he has any concerning symptoms in the interval. The patient voices understanding of current disease status and treatment options and  is in agreement with the current care plan. All questions were answered. The patient knows to call the clinic with any problems, questions or concerns. We can certainly see the patient much sooner if necessary    Orders Placed This Encounter  Procedures   CT Chest W Contrast    Standing Status:   Future    Standing Expiration Date:   08/07/2023    Order Specific Question:   If indicated for the ordered procedure, I authorize the administration of contrast media per Radiology protocol    Answer:   Yes    Order Specific Question:   Preferred imaging location?    Answer:   [facility]   CBC with Differential (Cancer Center Only)    Standing Status:   Future    Standing Expiration Date:   08/08/2023   CMP ([facility] only)    Standing Status:   Future    Standing Expiration Date:   08/08/2023   T4    Standing Status:   Future    Standing Expiration Date:   08/08/2023   TSH    Standing Status:   Future    Standing Expiration Date:   08/08/2023   CBC with Differential (Cancer Center Only)     Standing Status:   Future    Standing Expiration Date:   08/29/2023   CMP ([facility] only)    Standing Status:   Future    Standing Expiration Date:   08/29/2023   T4    Standing Status:   Future    Standing Expiration Date:   08/29/2023   TSH    Standing Status:   Future    Standing Expiration Date:   08/29/2023   CBC with Differential (Cancer Center Only)    Standing Status:   Future    Standing Expiration Date:   09/19/2023   CMP ([facility] only)    Standing Status:   Future    Standing Expiration Date:   09/19/2023   T4    Standing Status:   Future    Standing Expiration Date:   09/19/2023   TSH    Standing Status:   Future    Standing Expiration Date:   09/19/2023   CBC with Differential ([place] [facility] Only)    Standing Status:   Future    Standing Expiration Date:   10/10/2023   CMP ([place] only)    Standing Status:   Future    Standing Expiration Date:   10/10/2023   T4    Standing Status:   Future    Standing Expiration Date:   10/10/2023   TSH    Standing Status:   Future    Standing Expiration Date:   10/10/2023   CBC with Differential ([place] Only)    Standing Status:   Future    Standing Expiration Date:   10/31/2023   CMP ([place] only)    Standing Status:   Future    Standing Expiration Date:   10/31/2023   T4    Standing Status:   Future    Standing Expiration Date:   10/31/2023   TSH    Standing Status:   Future    Standing Expiration Date:   10/31/2023      The total time spent in the appointment was 20-29 minutes.   Calahan Pak L Herbert Marken, PA-C 08/07/22

## 2022-08-05 ENCOUNTER — Other Ambulatory Visit: Payer: Self-pay | Admitting: Thoracic Surgery (Cardiothoracic Vascular Surgery)

## 2022-08-07 ENCOUNTER — Inpatient Hospital Stay: Payer: Medicare Other

## 2022-08-07 ENCOUNTER — Other Ambulatory Visit: Payer: Self-pay

## 2022-08-07 ENCOUNTER — Inpatient Hospital Stay (HOSPITAL_BASED_OUTPATIENT_CLINIC_OR_DEPARTMENT_OTHER): Payer: Medicare Other | Admitting: Physician Assistant

## 2022-08-07 VITALS — BP 154/85 | HR 60 | Temp 97.7°F | Resp 14 | Wt 219.7 lb

## 2022-08-07 DIAGNOSIS — C3492 Malignant neoplasm of unspecified part of left bronchus or lung: Secondary | ICD-10-CM

## 2022-08-07 DIAGNOSIS — Z5112 Encounter for antineoplastic immunotherapy: Secondary | ICD-10-CM

## 2022-08-07 LAB — CBC WITH DIFFERENTIAL (CANCER CENTER ONLY)
Abs Immature Granulocytes: 0.01 10*3/uL (ref 0.00–0.07)
Basophils Absolute: 0.1 10*3/uL (ref 0.0–0.1)
Basophils Relative: 1 %
Eosinophils Absolute: 0.2 10*3/uL (ref 0.0–0.5)
Eosinophils Relative: 3 %
HCT: 35.6 % — ABNORMAL LOW (ref 39.0–52.0)
Hemoglobin: 11.9 g/dL — ABNORMAL LOW (ref 13.0–17.0)
Immature Granulocytes: 0 %
Lymphocytes Relative: 47 %
Lymphs Abs: 3.8 10*3/uL (ref 0.7–4.0)
MCH: 28.7 pg (ref 26.0–34.0)
MCHC: 33.4 g/dL (ref 30.0–36.0)
MCV: 85.8 fL (ref 80.0–100.0)
Monocytes Absolute: 0.9 10*3/uL (ref 0.1–1.0)
Monocytes Relative: 11 %
Neutro Abs: 3.1 10*3/uL (ref 1.7–7.7)
Neutrophils Relative %: 38 %
Platelet Count: 185 10*3/uL (ref 150–400)
RBC: 4.15 MIL/uL — ABNORMAL LOW (ref 4.22–5.81)
RDW: 14.4 % (ref 11.5–15.5)
WBC Count: 8.2 10*3/uL (ref 4.0–10.5)
nRBC: 0 % (ref 0.0–0.2)

## 2022-08-07 LAB — CMP (CANCER CENTER ONLY)
ALT: 16 U/L (ref 0–44)
AST: 17 U/L (ref 15–41)
Albumin: 4.2 g/dL (ref 3.5–5.0)
Alkaline Phosphatase: 74 U/L (ref 38–126)
Anion gap: 8 (ref 5–15)
BUN: 28 mg/dL — ABNORMAL HIGH (ref 8–23)
CO2: 23 mmol/L (ref 22–32)
Calcium: 9.1 mg/dL (ref 8.9–10.3)
Chloride: 106 mmol/L (ref 98–111)
Creatinine: 1.35 mg/dL — ABNORMAL HIGH (ref 0.61–1.24)
GFR, Estimated: 56 mL/min — ABNORMAL LOW (ref >60)
Glucose, Bld: 162 mg/dL — ABNORMAL HIGH (ref 70–99)
Potassium: 3.7 mmol/L (ref 3.5–5.1)
Sodium: 137 mmol/L (ref 135–145)
Total Bilirubin: 0.3 mg/dL (ref 0.3–1.2)
Total Protein: 7.2 g/dL (ref 6.5–8.1)

## 2022-08-07 LAB — TSH: TSH: 3.765 u[IU]/mL (ref 0.350–4.500)

## 2022-08-07 MED ORDER — SODIUM CHLORIDE 0.9 % IV SOLN: Freq: Once | INTRAVENOUS | Status: AC

## 2022-08-07 MED ORDER — SODIUM CHLORIDE 0.9 % IV SOLN
1200.0000 mg | Freq: Once | INTRAVENOUS | Status: AC
  Administered 2022-08-07: 1200 mg via INTRAVENOUS
  Filled 2022-08-07: qty 20

## 2022-08-07 NOTE — Patient Instructions (Signed)
[facility] ONCOLOGY  Discharge Instructions: Thank you for choosing [facility] to provide your oncology and hematology care.   If you have a lab appointment with the [facility], please go directly to the [facility] and check in at the registration area.   Wear comfortable clothing and clothing appropriate for easy access to any Portacath or PICC line.   We strive to give you quality time with your provider. You may need to reschedule your appointment if you arrive late (15 or more minutes).  Arriving late affects you and other patients whose appointments are after yours.  Also, if you miss three or more appointments without notifying the office, you may be dismissed from the clinic at the provider's discretion.      For prescription refill requests, have your pharmacy contact our office and allow 72 hours for refills to be completed.    Today you received the following chemotherapy and/or immunotherapy agents: atezolizumab      To help prevent nausea and vomiting after your treatment, we encourage you to take your nausea medication as directed.  BELOW ARE SYMPTOMS THAT SHOULD BE REPORTED IMMEDIATELY: *FEVER GREATER THAN 100.4 F (38 C) OR HIGHER *CHILLS OR SWEATING *NAUSEA AND VOMITING THAT IS NOT CONTROLLED WITH YOUR NAUSEA MEDICATION *UNUSUAL SHORTNESS OF BREATH *UNUSUAL BRUISING OR BLEEDING *URINARY PROBLEMS (pain or burning when urinating, or frequent urination) *BOWEL PROBLEMS (unusual diarrhea, constipation, pain near the anus) TENDERNESS IN MOUTH AND THROAT WITH OR WITHOUT PRESENCE OF ULCERS (sore throat, sores in mouth, or a toothache) UNUSUAL RASH, SWELLING OR PAIN  UNUSUAL VAGINAL DISCHARGE OR ITCHING   Items with * indicate a potential emergency and should be followed up as soon as possible or go to the Emergency Department if any problems should occur.  Please show the CHEMOTHERAPY ALERT CARD or IMMUNOTHERAPY ALERT CARD at check-in  to the Emergency Department and triage nurse.  Should you have questions after your visit or need to cancel or reschedule your appointment, please contact [facility]  Dept: [phone]  and follow the prompts.  Office hours are 8:00 a.m. to 4:30 p.m. Monday - Friday. Please note that voicemails left after 4:00 p.m. may not be returned until the following business day.  We are closed weekends and major holidays. You have access to a nurse at all times for urgent questions. Please call the main number to the clinic Dept: [phone] and follow the prompts.   For any non-urgent questions, you may also contact your provider using MyChart. We now offer e-Visits for anyone 80 and older to request care online for non-urgent symptoms. For details visit [email or web address].   Also download the MyChart app! Go to the app store, search "MyChart", open the app, select [facility], and log in with your MyChart username and password.  Masks are optional in the cancer centers. If you would like for your care team to wear a mask while they are taking care of you, please let them know. You may have one support person who is at least 70 years old accompany you for your appointments.

## 2022-08-08 ENCOUNTER — Other Ambulatory Visit: Payer: Self-pay

## 2022-08-08 LAB — T4: T4, Total: 5.3 ug/dL (ref 4.5–12.0)

## 2022-08-09 ENCOUNTER — Encounter: Payer: Self-pay | Admitting: Internal Medicine

## 2022-08-09 ENCOUNTER — Inpatient Hospital Stay: Payer: Medicare Other | Admitting: Licensed Clinical Social Worker

## 2022-08-09 DIAGNOSIS — C3492 Malignant neoplasm of unspecified part of left bronchus or lung: Secondary | ICD-10-CM

## 2022-08-09 NOTE — Progress Notes (Signed)
[facility] CSW Progress Note  Clinical Education officer, museum contacted patient by phone regarding referral from medical provider.  Pt completed chemotherapy 4 months ago and now receives immunotherapy every three weeks.  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  Pt requested a note from Dr. Julien Nordmann to extend his work excused time off; however, at this time pt could return to a job if he went back with light duty.  Pt explained that there is not a position at the bakery which would allow him to have light duty.  CSW discussed the possibility that pt's new baseline may require him to take a different job.  Pt may need to consider leaving his current job to do something less physically demanding.  Per pt he does receive social security retirement, but it is not enough for him to pay his bills.  Pt spoke w/ a patient Arboriculturist in January when he started treatment, but did not move forward with applying for the [employer].  CSW sent a message to the patient financial resource specialist to see if pt may still be able to utilize those funds since they were not used during treatment as pt needs financial support while trying to find a new job.  [employer] assisting w/ financial resources provided to pt.  CSW to remain available as appropriate.  Henriette Combs, LCSW

## 2022-08-09 NOTE — Progress Notes (Signed)
Received referral from social worker regarding [name].  Called patient to introduce myself as Arboriculturist and to screen for [name]. Patient's household income exceeds guidelines. Advised him of this and he verbalized understanding.  Message sent back to social worker to advise.

## 2022-08-16 ENCOUNTER — Other Ambulatory Visit: Payer: Self-pay

## 2022-08-19 ENCOUNTER — Other Ambulatory Visit: Payer: Self-pay

## 2022-08-26 ENCOUNTER — Ambulatory Visit (HOSPITAL_COMMUNITY)
Admission: RE | Admit: 2022-08-26 | Discharge: 2022-08-26 | Disposition: A | Discharge status: Home / Self Care | Payer: Medicare Other | Source: Ambulatory Visit | Attending: Physician Assistant | Admitting: Physician Assistant

## 2022-08-26 ENCOUNTER — Encounter (HOSPITAL_COMMUNITY): Payer: Self-pay

## 2022-08-26 DIAGNOSIS — C3492 Malignant neoplasm of unspecified part of left bronchus or lung: Secondary | ICD-10-CM

## 2022-08-26 MED ORDER — IOHEXOL 300 MG/ML  SOLN
75.0000 mL | Freq: Once | INTRAMUSCULAR | Status: AC | PRN
  Administered 2022-08-26: 75 mL via INTRAVENOUS

## 2022-08-26 MED ORDER — SODIUM CHLORIDE (PF) 0.9 % IJ SOLN
INTRAMUSCULAR | Status: AC
  Filled 2022-08-26: qty 50

## 2022-08-28 ENCOUNTER — Inpatient Hospital Stay (HOSPITAL_BASED_OUTPATIENT_CLINIC_OR_DEPARTMENT_OTHER): Payer: Medicare Other | Admitting: Internal Medicine

## 2022-08-28 ENCOUNTER — Inpatient Hospital Stay: Payer: Medicare Other | Attending: Internal Medicine

## 2022-08-28 ENCOUNTER — Inpatient Hospital Stay: Payer: Medicare Other

## 2022-08-28 VITALS — BP 130/72 | HR 59 | Resp 18 | Wt 217.1 lb

## 2022-08-28 DIAGNOSIS — C3492 Malignant neoplasm of unspecified part of left bronchus or lung: Secondary | ICD-10-CM | POA: Diagnosis not present

## 2022-08-28 DIAGNOSIS — Z79899 Other long term (current) drug therapy: Secondary | ICD-10-CM | POA: Diagnosis not present

## 2022-08-28 DIAGNOSIS — I2510 Atherosclerotic heart disease of native coronary artery without angina pectoris: Secondary | ICD-10-CM | POA: Diagnosis not present

## 2022-08-28 DIAGNOSIS — M479 Spondylosis, unspecified: Secondary | ICD-10-CM | POA: Insufficient documentation

## 2022-08-28 DIAGNOSIS — I10 Essential (primary) hypertension: Secondary | ICD-10-CM | POA: Insufficient documentation

## 2022-08-28 DIAGNOSIS — N289 Disorder of kidney and ureter, unspecified: Secondary | ICD-10-CM | POA: Diagnosis not present

## 2022-08-28 DIAGNOSIS — C3412 Malignant neoplasm of upper lobe, left bronchus or lung: Secondary | ICD-10-CM | POA: Diagnosis present

## 2022-08-28 DIAGNOSIS — Z5112 Encounter for antineoplastic immunotherapy: Secondary | ICD-10-CM | POA: Diagnosis present

## 2022-08-28 DIAGNOSIS — E119 Type 2 diabetes mellitus without complications: Secondary | ICD-10-CM | POA: Diagnosis not present

## 2022-08-28 LAB — CMP (CANCER CENTER ONLY)
ALT: 15 U/L (ref 0–44)
AST: 18 U/L (ref 15–41)
Albumin: 4.4 g/dL (ref 3.5–5.0)
Alkaline Phosphatase: 66 U/L (ref 38–126)
Anion gap: 9 (ref 5–15)
BUN: 48 mg/dL — ABNORMAL HIGH (ref 8–23)
CO2: 23 mmol/L (ref 22–32)
Calcium: 9.5 mg/dL (ref 8.9–10.3)
Chloride: 105 mmol/L (ref 98–111)
Creatinine: 2.59 mg/dL — ABNORMAL HIGH (ref 0.61–1.24)
GFR, Estimated: 26 mL/min — ABNORMAL LOW (ref >60)
Glucose, Bld: 164 mg/dL — ABNORMAL HIGH (ref 70–99)
Potassium: 3.9 mmol/L (ref 3.5–5.1)
Sodium: 137 mmol/L (ref 135–145)
Total Bilirubin: 0.6 mg/dL (ref 0.3–1.2)
Total Protein: 7.3 g/dL (ref 6.5–8.1)

## 2022-08-28 LAB — CBC WITH DIFFERENTIAL (CANCER CENTER ONLY)
Abs Immature Granulocytes: 0.01 10*3/uL (ref 0.00–0.07)
Basophils Absolute: 0.1 10*3/uL (ref 0.0–0.1)
Basophils Relative: 1 %
Eosinophils Absolute: 0.2 10*3/uL (ref 0.0–0.5)
Eosinophils Relative: 3 %
HCT: 36.5 % — ABNORMAL LOW (ref 39.0–52.0)
Hemoglobin: 12.2 g/dL — ABNORMAL LOW (ref 13.0–17.0)
Immature Granulocytes: 0 %
Lymphocytes Relative: 34 %
Lymphs Abs: 2.5 10*3/uL (ref 0.7–4.0)
MCH: 28.5 pg (ref 26.0–34.0)
MCHC: 33.4 g/dL (ref 30.0–36.0)
MCV: 85.3 fL (ref 80.0–100.0)
Monocytes Absolute: 0.9 10*3/uL (ref 0.1–1.0)
Monocytes Relative: 12 %
Neutro Abs: 3.6 10*3/uL (ref 1.7–7.7)
Neutrophils Relative %: 50 %
Platelet Count: 193 10*3/uL (ref 150–400)
RBC: 4.28 MIL/uL (ref 4.22–5.81)
RDW: 14.5 % (ref 11.5–15.5)
WBC Count: 7.2 10*3/uL (ref 4.0–10.5)
nRBC: 0 % (ref 0.0–0.2)

## 2022-08-28 LAB — TSH: TSH: 1.977 u[IU]/mL (ref 0.350–4.500)

## 2022-08-28 MED ORDER — SODIUM CHLORIDE 0.9 % IV SOLN: INTRAVENOUS | Status: AC

## 2022-08-28 MED ORDER — SODIUM CHLORIDE 0.9 % IV SOLN
1200.0000 mg | Freq: Once | INTRAVENOUS | Status: AC
  Administered 2022-08-28: 1200 mg via INTRAVENOUS
  Filled 2022-08-28: qty 20

## 2022-08-28 MED ORDER — SODIUM CHLORIDE 0.9 % IV SOLN: Freq: Once | INTRAVENOUS | Status: AC

## 2022-08-28 NOTE — Patient Instructions (Signed)
[name]  Discharge Instructions: Thank you for choosing [facility] to provide your oncology and hematology care.   If you have a lab appointment with the [facility], please go directly to the [facility] and check in at the registration area.   Wear comfortable clothing and clothing appropriate for easy access to any Portacath or PICC line.   We strive to give you quality time with your provider. You may need to reschedule your appointment if you arrive late (15 or more minutes).  Arriving late affects you and other patients whose appointments are after yours.  Also, if you miss three or more appointments without notifying the office, you may be dismissed from the clinic at the provider's discretion.      For prescription refill requests, have your pharmacy contact our office and allow 72 hours for refills to be completed.    Today you received the following chemotherapy and/or immunotherapy agents: atezolizumab      To help prevent nausea and vomiting after your treatment, we encourage you to take your nausea medication as directed.  BELOW ARE SYMPTOMS THAT SHOULD BE REPORTED IMMEDIATELY: *FEVER GREATER THAN 100.4 F (38 C) OR HIGHER *CHILLS OR SWEATING *NAUSEA AND VOMITING THAT IS NOT CONTROLLED WITH YOUR NAUSEA MEDICATION *UNUSUAL SHORTNESS OF BREATH *UNUSUAL BRUISING OR BLEEDING *URINARY PROBLEMS (pain or burning when urinating, or frequent urination) *BOWEL PROBLEMS (unusual diarrhea, constipation, pain near the anus) TENDERNESS IN MOUTH AND THROAT WITH OR WITHOUT PRESENCE OF ULCERS (sore throat, sores in mouth, or a toothache) UNUSUAL RASH, SWELLING OR PAIN  UNUSUAL VAGINAL DISCHARGE OR ITCHING   Items with * indicate a potential emergency and should be followed up as soon as possible or go to the Emergency Department if any problems should occur.  Please show the CHEMOTHERAPY ALERT CARD or IMMUNOTHERAPY ALERT CARD at check-in  to the Emergency Department and triage nurse.  Should you have questions after your visit or need to cancel or reschedule your appointment, please contact [facility]  Dept: [phone]  and follow the prompts.  Office hours are 8:00 a.m. to 4:30 p.m. Monday - Friday. Please note that voicemails left after 4:00 p.m. may not be returned until the following business day.  We are closed weekends and major holidays. You have access to a nurse at all times for urgent questions. Please call the main number to the clinic Dept: [phone] and follow the prompts.   For any non-urgent questions, you may also contact your provider using MyChart. We now offer e-Visits for anyone 47 and older to request care online for non-urgent symptoms. For details visit [email or web address].   Also download the MyChart app! Go to the app store, search "MyChart", open the app, select [facility], and log in with your MyChart username and password.  Masks are optional in the cancer centers. If you would like for your care team to wear a mask while they are taking care of you, please let them know. You may have one support person who is at least 70 years old accompany you for your appointments.

## 2022-08-28 NOTE — Progress Notes (Signed)
Ok to treat today per Dr. Julien Nordmann with Scr of 2.59. Additional 1 liter IVF ordered during treatment today.

## 2022-08-28 NOTE — Progress Notes (Signed)
[name] [phone]   Fax:([phone]) [place] [address]  DIAGNOSIS: Stage IIIA (T1c, N2, M0) non-small cell lung cancer, moderately differentiated adenocarcinoma diagnosed in December 2022.  PD-L1 expression is 70%.    Molecular study showed no actionable mutation.  It has only MAP2K1  PRIOR THERAPY:  1) Status post robotic assisted left video thoracoscopy with emergency thoracotomy and left upper lobectomy and mediastinal lymph node sampling under the care of Dr. Kipp Brood on November 15, 2021. 2) Adjuvant systemic chemotherapy with carboplatin for AUC of 6 and paclitaxel 200 Mg/M2 with Neulasta support every 3 weeks.  First dose January 08, 2022.  The patient was not eligible for treatment with cisplatin and Alimta because of renal insufficiency.  Status post 4 cycles.  Last dose was given on March 11, 2022.  CURRENT THERAPY: Adjuvant treatment with immunotherapy with atezolizumab 1200 Mg IV every 3 weeks.  First dose 04/23/2022.  Status post 6 cycles.  INTERVAL HISTORY: [name] 71 y.o. male returns to the clinic today for follow-up visit.  The patient is feeling fine today with no concerning complaints.  He has been tolerating his treatment with adjuvant atezolizumab fairly well.  He denied having any chest pain, shortness of breath, cough or hemoptysis.  He denied having any nausea, vomiting, diarrhea or constipation.  He has no headache or visual changes.  He denied having any recent weight loss or night sweats.  He had repeat CT scan of the chest performed recently and he is here for evaluation and discussion of his scan results.  MEDICAL HISTORY: Past Medical History:  Diagnosis Date   Diabetes mellitus ([place])    Dyslipidemia    Hypertension     ALLERGIES:  has No Known Allergies.  MEDICATIONS:  Current Outpatient Medications  Medication Sig Dispense  Refill   atenolol (TENORMIN) 100 MG tablet Take 100 mg by mouth daily.     atorvastatin (LIPITOR) 20 MG tablet Take 20 mg by mouth daily.     famotidine (PEPCID) 20 MG tablet Take 20 mg by mouth daily.     gabapentin (NEURONTIN) 300 MG capsule Take 1 capsule (300 mg total) by mouth 3 (three) times daily. 90 capsule 0   losartan-hydrochlorothiazide (HYZAAR) 100-12.5 MG tablet Take 1 tablet by mouth daily.     metFORMIN (GLUCOPHAGE-XR) 500 MG 24 hr tablet Take 1,000 mg by mouth every evening.     methocarbamol (ROBAXIN) 500 MG tablet Take 1 tablet (500 mg total) by mouth 3 (three) times daily. 90 tablet 0   prochlorperazine (COMPAZINE) 10 MG tablet Take 10 mg by mouth every 6 (six) hours as needed for nausea or vomiting. (Patient not taking: Reported on 05/15/2022)     No current facility-administered medications for this visit.    SURGICAL HISTORY:  Past Surgical History:  Procedure Laterality Date   BRONCHIAL BIOPSY  11/15/2021   Procedure: BRONCHIAL BIOPSIES;  Surgeon: Garner Nash, DO;  Location: [facility];  Service: Pulmonary;;   BRONCHIAL BRUSHINGS  11/15/2021   Procedure: BRONCHIAL BRUSHINGS;  Surgeon: Garner Nash, DO;  Location: [facility];  Service: Pulmonary;;   FIDUCIAL MARKER PLACEMENT  11/15/2021   Procedure: FIDUCIAL DYE MARKING;  Surgeon: Garner Nash, DO;  Location: [facility];  Service: Pulmonary;;   INGUINAL HERNIA REPAIR     INTERCOSTAL NERVE BLOCK Left 11/15/2021   Procedure: INTERCOSTAL NERVE BLOCK;  Surgeon: Kipp Brood,  Lucile Crater, MD;  Location: [facility];  Service: Thoracic;  Laterality: Left;   LOBECTOMY Left 11/15/2021   Procedure: LEFT UPPER LOBECTOMY;  Surgeon: Lajuana Matte, MD;  Location: [facility];  Service: Thoracic;  Laterality: Left;   MEDIASTINAL EXPLORATION Left 11/15/2021   Procedure: MEDIASTINAL LYMPH NODE EXPLORATION;  Surgeon: Lajuana Matte, MD;  Location: [facility];  Service: Thoracic;  Laterality: Left;   NODE DISSECTION Left 11/15/2021    Procedure: NODE DISSECTION;  Surgeon: Lajuana Matte, MD;  Location: [facility];  Service: Thoracic;  Laterality: Left;   RIB PLATING Left 11/15/2021   Procedure: RIB PLATING;  Surgeon: Lajuana Matte, MD;  Location: [facility];  Service: Thoracic;  Laterality: Left;   THORACOTOMY/LOBECTOMY Left 11/15/2021   Procedure: EMERGENCY THORACOTOMY;  Surgeon: Lajuana Matte, MD;  Location: [facility];  Service: Thoracic;  Laterality: Left;   VIDEO BRONCHOSCOPY WITH RADIAL ENDOBRONCHIAL ULTRASOUND  11/15/2021   Procedure: VIDEO BRONCHOSCOPY WITH RADIAL ENDOBRONCHIAL ULTRASOUND;  Surgeon: Garner Nash, DO;  Location: [facility];  Service: Pulmonary;;    REVIEW OF SYSTEMS:  Constitutional: negative Eyes: negative Ears, nose, mouth, throat, and face: negative Respiratory: negative Cardiovascular: negative Gastrointestinal: negative Genitourinary:negative Integument/breast: negative Hematologic/lymphatic: negative Musculoskeletal:negative Neurological: negative Behavioral/Psych: negative Endocrine: negative Allergic/Immunologic: negative   PHYSICAL EXAMINATION: General appearance: alert, cooperative, and no distress Head: Normocephalic, without obvious abnormality, atraumatic Neck: no adenopathy, no JVD, supple, symmetrical, trachea midline, and thyroid not enlarged, symmetric, no tenderness/mass/nodules Lymph nodes: Cervical, supraclavicular, and axillary nodes normal. Resp: clear to auscultation bilaterally Back: symmetric, no curvature. ROM normal. No CVA tenderness. Cardio: regular rate and rhythm, S1, S2 normal, no murmur, click, rub or gallop GI: soft, non-tender; bowel sounds normal; no masses,  no organomegaly Extremities: extremities normal, atraumatic, no cyanosis or edema Neurologic: Alert and oriented X 3, normal strength and tone. Normal symmetric reflexes. Normal coordination and gait  ECOG PERFORMANCE STATUS: 1 - Symptomatic but completely ambulatory  Blood pressure  130/72, pulse (!) 59, resp. rate 18, weight 217 lb 1 oz (98.5 kg), SpO2 99 %.  LABORATORY DATA: Lab Results  Component Value Date   WBC 7.2 08/28/2022   HGB 12.2 (L) 08/28/2022   HCT 36.5 (L) 08/28/2022   MCV 85.3 08/28/2022   PLT 193 08/28/2022      Chemistry      Component Value Date/Time   NA 137 08/07/2022 0756   K 3.7 08/07/2022 0756   CL 106 08/07/2022 0756   CO2 23 08/07/2022 0756   BUN 28 (H) 08/07/2022 0756   CREATININE 1.35 (H) 08/07/2022 0756      Component Value Date/Time   CALCIUM 9.1 08/07/2022 0756   ALKPHOS 74 08/07/2022 0756   AST 17 08/07/2022 0756   ALT 16 08/07/2022 0756   BILITOT 0.3 08/07/2022 0756       RADIOGRAPHIC STUDIES: CT Chest W Contrast  Result Date: 08/26/2022 CLINICAL DATA:  Non-small cell left lung cancer with ongoing immunotherapy. Left upper lobectomy. Restaging. * Tracking Code: BO * EXAM: CT CHEST WITH CONTRAST TECHNIQUE: Multidetector CT imaging of the chest was performed during intravenous contrast administration. RADIATION DOSE REDUCTION: This exam was performed according to the departmental dose-optimization program which includes automated exposure control, adjustment of the mA and/or kV according to patient size and/or use of iterative reconstruction technique. CONTRAST:  66mL OMNIPAQUE IOHEXOL 300 MG/ML  SOLN COMPARISON:  04/11/2022 chest CT. FINDINGS: Cardiovascular: Normal heart size. No significant pericardial effusion/thickening. Left anterior descending and right coronary atherosclerosis.  Atherosclerotic nonaneurysmal thoracic aorta. Normal caliber pulmonary arteries. No central pulmonary emboli. Mediastinum/Nodes: No discrete thyroid nodules. Unremarkable esophagus. No pathologically enlarged axillary, mediastinal or hilar lymph nodes. Lungs/Pleura: No pneumothorax. No right pleural effusion. Trace anterior upper and dependent basilar left pleural effusion, similar. Status post left upper lobectomy. No acute consolidative airspace  disease, lung masses or significant pulmonary nodules. Stable tiny calcified posterior right upper lobe granuloma. Upper abdomen: Small hiatal hernia. Mild left colonic diverticulosis. Musculoskeletal: No aggressive appearing focal osseous lesions. Healed deformities in the posterior left seventh, eighth, ninth and tenth ribs. Fixation hardware again noted in anterolateral left fourth and fifth ribs, intact. Marked thoracic spondylosis. IMPRESSION: 1. No evidence of local tumor recurrence status post left upper lobectomy. 2. No evidence of metastatic disease in the chest. 3. Trace left pleural effusion, similar. 4. Two-vessel coronary atherosclerosis. 5. Small hiatal hernia. 6. Mild left colonic diverticulosis. 7. Aortic Atherosclerosis (ICD10-I70.0). Electronically Signed   By: Ilona Sorrel M.D.   On: 08/26/2022 11:07    ASSESSMENT AND PLAN: This is a very pleasant 71 years old white male diagnosed with a stage IIIa (T1c, N2, M0) non-small cell lung cancer, adenocarcinoma presented with left upper lobe lung nodule in addition to mediastinal lymphadenopathy diagnosed in December 2022.  The patient is status post left upper lobectomy with lymph node dissection.  The resection margins were negative for malignancy and there was no visceral pleural involvement but lymphovascular invasion. The patient has no actionable mutations and PD-L1 expression was 70% I recommended for the patient to proceed with adjuvant systemic chemotherapy for 4 cycles followed by adjuvant treatment with immunotherapy with atezolizumab.  Initially will consider The patient for treatment with cisplatin and Alimta but because of his renal insufficiency, will change the adjuvant systemic chemotherapy to carboplatin for AUC of 6 and paclitaxel 200 Mg/M2 every 3 weeks with Neulasta support. He is status post 4 cycles.  Last dose was given on March 11, 2022. The patient has been tolerating this treatment fairly well with no concerning  complaints except for the pain from the Neulasta injection. The patient is currently undergoing adjuvant treatment with immunotherapy with atezolizumab 1200 Mg IV every 3 weeks status post 6 cycles.  The patient has been tolerating this treatment well with no concerning adverse effects. He had repeat CT scan of the chest performed recently.  I personally and independently reviewed the scan and discussed the result with the patient today. His scan showed no concerning findings for disease recurrence or metastasis. Regarding the atherosclerosis and spondylosis, he was advised to reach out to his primary care physician for additional evaluation and referral to cardiology or orthopedic surgery if needed. For the renal insufficiency, we will give the patient 1 L of normal saline in the clinic today and we encouraged him to increase his oral intake and if no improvement he will need referral to nephrology for further evaluation. The patient will proceed with cycle #7 of his treatment today as planned. He will come back for follow-up visit in 3 weeks for evaluation before the next cycle of his treatment. He was advised to call immediately if he has any other concerning symptoms in the interval.  The patient voices understanding of current disease status and treatment options and is in agreement with the current care plan.  All questions were answered. The patient knows to call the clinic with any problems, questions or concerns. We can certainly see the patient much sooner if necessary.   Disclaimer: This note  was dictated with voice recognition software. Similar sounding words can inadvertently be transcribed and may not be corrected upon review.

## 2022-08-30 ENCOUNTER — Other Ambulatory Visit: Payer: Self-pay

## 2022-09-02 ENCOUNTER — Other Ambulatory Visit: Payer: Self-pay

## 2022-09-05 ENCOUNTER — Other Ambulatory Visit: Payer: Self-pay | Admitting: Thoracic Surgery (Cardiothoracic Vascular Surgery)

## 2022-09-15 NOTE — Progress Notes (Unsigned)
[facility] OFFICE PROGRESS NOTE  Curt Bears, MD [address]  [place] [address]  DIAGNOSIS: Stage IIIA (T1c, N2, M0) non-small cell lung cancer, moderately differentiated adenocarcinoma diagnosed in December 2022.   PD-L1 expression is 70%.     Molecular study showed no actionable mutation.  It has only MAP2K1  PRIOR THERAPY: 1) Status post robotic assisted left video thoracoscopy with emergency thoracotomy and left upper lobectomy and mediastinal lymph node sampling under the care of Dr. Kipp Brood on November 15, 2021. 2) Adjuvant systemic chemotherapy with carboplatin for AUC of 6 and paclitaxel 200 Mg/M2 with Neulasta support every 3 weeks.  First dose January 08, 2022.  The patient was not eligible for treatment with cisplatin and Alimta because of renal insufficiency.  Status post 4 cycles.  Last dose was given on March 11, 2022.  CURRENT THERAPY: Adjuvant treatment with immunotherapy with atezolizumab 1200 Mg IV every 3 weeks.  First dose 04/23/2022.  Status post 7 cycles  INTERVAL HISTORY: [name] 71 y.o. male returns to the clinic today for a follow-up visit.  The patient is currently undergoing treatment with adjuvant immunotherapy with Tecentriq.  He is status post 7 cycles.  Has been tolerating it fairly well.  He denies any new concerning complaints today.  The patient has stable fatigue and decreased exercise tolerance.  He  saw Dr. Kipp Brood a few months ago who recommended that he enroll in an exercise program.  I gave the patient the number to their clinic to reschedule at previous visits. He states he is unable to exercise because of his breathing. Additionally, he is 71 years old and has a hard time working due to his shortness of breath and back pain. He also takes muscle relaxer which makes him drowsy and states he cannot drive to work due to this. He works in [employer]. He asked again today for [place] to write him off of work  for 3 months. We previously reviewed at a prior appointment what we are able to do for him, which will request limited hours and accommodations and frequent breaks. I also offered a referral to social work. He is not interested. He is already receiving social security.   He denies any fever or chills.  He reports a good appetite and gained weight since last being seen.  He has some left-sided chest discomfort over the surgical site for which he is prescribed Robaxin. He reports stable baseline dyspnea on exertion. Denies significant cough unless he has phlegm in his throat he needs to clear.  Denies hemoptysis.  Denies any nausea or vomiting.  Denies any diarrhea or constipation.  He denies any headache or visual changes. He has some dry skin on his face for which he uses lotion.  He is here today for evaluation and repeat blood work before undergoing cycle #8.   MEDICAL HISTORY: Past Medical History:  Diagnosis Date   Diabetes mellitus ([place])    Dyslipidemia    Hypertension     ALLERGIES:  has No Known Allergies.  MEDICATIONS:  Current Outpatient Medications  Medication Sig Dispense Refill   atenolol (TENORMIN) 100 MG tablet Take 100 mg by mouth daily.     atorvastatin (LIPITOR) 20 MG tablet Take 20 mg by mouth daily.     famotidine (PEPCID) 20 MG tablet Take 20 mg by mouth daily.     gabapentin (NEURONTIN) 300 MG capsule Take 1 capsule (300 mg total) by mouth 3 (three) times daily.  90 capsule 0   losartan-hydrochlorothiazide (HYZAAR) 100-12.5 MG tablet Take 1 tablet by mouth daily.     metFORMIN (GLUCOPHAGE-XR) 500 MG 24 hr tablet Take 1,000 mg by mouth every evening.     methocarbamol (ROBAXIN) 500 MG tablet TAKE ONE TABLET BY MOUTH THREE TIMES A DAY 90 tablet 0   prochlorperazine (COMPAZINE) 10 MG tablet Take 10 mg by mouth every 6 (six) hours as needed for nausea or vomiting. (Patient not taking: Reported on 05/15/2022)     No current facility-administered medications for this visit.     SURGICAL HISTORY:  Past Surgical History:  Procedure Laterality Date   BRONCHIAL BIOPSY  11/15/2021   Procedure: BRONCHIAL BIOPSIES;  Surgeon: Garner Nash, DO;  Location: [facility];  Service: Pulmonary;;   BRONCHIAL BRUSHINGS  11/15/2021   Procedure: BRONCHIAL BRUSHINGS;  Surgeon: Garner Nash, DO;  Location: [facility];  Service: Pulmonary;;   FIDUCIAL MARKER PLACEMENT  11/15/2021   Procedure: FIDUCIAL DYE MARKING;  Surgeon: Garner Nash, DO;  Location: [facility];  Service: Pulmonary;;   INGUINAL HERNIA REPAIR     INTERCOSTAL NERVE BLOCK Left 11/15/2021   Procedure: INTERCOSTAL NERVE BLOCK;  Surgeon: Lajuana Matte, MD;  Location: [facility];  Service: Thoracic;  Laterality: Left;   LOBECTOMY Left 11/15/2021   Procedure: LEFT UPPER LOBECTOMY;  Surgeon: Lajuana Matte, MD;  Location: [facility];  Service: Thoracic;  Laterality: Left;   MEDIASTINAL EXPLORATION Left 11/15/2021   Procedure: MEDIASTINAL LYMPH NODE EXPLORATION;  Surgeon: Lajuana Matte, MD;  Location: [facility];  Service: Thoracic;  Laterality: Left;   NODE DISSECTION Left 11/15/2021   Procedure: NODE DISSECTION;  Surgeon: Lajuana Matte, MD;  Location: [facility];  Service: Thoracic;  Laterality: Left;   RIB PLATING Left 11/15/2021   Procedure: RIB PLATING;  Surgeon: Lajuana Matte, MD;  Location: [facility];  Service: Thoracic;  Laterality: Left;   THORACOTOMY/LOBECTOMY Left 11/15/2021   Procedure: EMERGENCY THORACOTOMY;  Surgeon: Lajuana Matte, MD;  Location: [facility];  Service: Thoracic;  Laterality: Left;   VIDEO BRONCHOSCOPY WITH RADIAL ENDOBRONCHIAL ULTRASOUND  11/15/2021   Procedure: VIDEO BRONCHOSCOPY WITH RADIAL ENDOBRONCHIAL ULTRASOUND;  Surgeon: Garner Nash, DO;  Location: [facility];  Service: Pulmonary;;    REVIEW OF SYSTEMS:   Constitutional: Positive for baseline fatigue. Negative for appetite change, chills, fever and unexpected weight change.  HENT: Negative for mouth sores,  nosebleeds, sore throat and trouble swallowing.   Eyes: Negative for eye problems and icterus.  Respiratory: Positive for some occasional dyspnea on exertion/decreased exercise tolerance. Negative for cough, hemoptysis, and wheezing.   Cardiovascular: Positive for some occasional pain over surgery site on chest. Negative for leg swelling.  Gastrointestinal: Negative for abdominal pain, constipation (none at this time), diarrhea, nausea and vomiting.  Genitourinary: Negative for bladder incontinence, difficulty urinating, dysuria, frequency and hematuria.   Musculoskeletal: Negative for back pain, gait problem, neck pain and neck stiffness.  Skin: Negative for rash or itching.  Neurological: Negative for dizziness (none at this time, only occasionally with gabapentin), extremity weakness, gait problem, headaches, light-headedness and seizures.  Hematological: Negative for adenopathy. Does not bruise/bleed easily.  Psychiatric/Behavioral: Negative for confusion, depression and sleep disturbance. The patient is not nervous/anxious.   PHYSICAL EXAMINATION:  Blood pressure (!) 143/84, pulse 72, temperature 97.8 F (36.6 C), temperature source Oral, resp. rate 14, height 5' 11"  (1.803 m), weight 218 lb 3.2 oz (99 kg), SpO2 97 %.  ECOG PERFORMANCE STATUS: 1  Physical Exam  Constitutional: Oriented to person, place, and time and well-developed, well-nourished, and in no distress. HENT:  Head: Normocephalic and atraumatic.  Mouth/Throat: Oropharynx is clear and moist. No oropharyngeal exudate.  Eyes: Conjunctivae are normal. Right eye exhibits no discharge. Left eye exhibits no discharge. No scleral icterus.  Neck: Normal range of motion. Neck supple.  Cardiovascular: Normal rate, regular rhythm, normal heart sounds and intact distal pulses.   Pulmonary/Chest: Effort normal and breath sounds normal. No respiratory distress. No wheezes. No rales.  Abdominal: Soft. Bowel sounds are normal. Exhibits  no distension and no mass. There is no tenderness.  Musculoskeletal: Normal range of motion. Exhibits no edema.  Lymphadenopathy:    No cervical adenopathy.  Neurological: Alert and oriented to person, place, and time. Exhibits normal muscle tone. Gait normal. Coordination normal.  Skin: Skin is warm and dry. No rash noted. Not diaphoretic. No erythema. No pallor.  Psychiatric: Mood, memory and judgment normal.  Vitals reviewed.  LABORATORY DATA: Lab Results  Component Value Date   WBC 10.3 09/18/2022   HGB 12.6 (L) 09/18/2022   HCT 37.5 (L) 09/18/2022   MCV 84.1 09/18/2022   PLT 209 09/18/2022      Chemistry      Component Value Date/Time   NA 137 09/18/2022 0752   K 3.4 (L) 09/18/2022 0752   CL 104 09/18/2022 0752   CO2 22 09/18/2022 0752   BUN 31 (H) 09/18/2022 0752   CREATININE 1.83 (H) 09/18/2022 0752      Component Value Date/Time   CALCIUM 9.3 09/18/2022 0752   ALKPHOS 77 09/18/2022 0752   AST 20 09/18/2022 0752   ALT 16 09/18/2022 0752   BILITOT 0.5 09/18/2022 0752       RADIOGRAPHIC STUDIES:  CT Chest W Contrast  Result Date: 08/26/2022 CLINICAL DATA:  Non-small cell left lung cancer with ongoing immunotherapy. Left upper lobectomy. Restaging. * Tracking Code: BO * EXAM: CT CHEST WITH CONTRAST TECHNIQUE: Multidetector CT imaging of the chest was performed during intravenous contrast administration. RADIATION DOSE REDUCTION: This exam was performed according to the departmental dose-optimization program which includes automated exposure control, adjustment of the mA and/or kV according to patient size and/or use of iterative reconstruction technique. CONTRAST:  1m OMNIPAQUE IOHEXOL 300 MG/ML  SOLN COMPARISON:  04/11/2022 chest CT. FINDINGS: Cardiovascular: Normal heart size. No significant pericardial effusion/thickening. Left anterior descending and right coronary atherosclerosis. Atherosclerotic nonaneurysmal thoracic aorta. Normal caliber pulmonary arteries. No  central pulmonary emboli. Mediastinum/Nodes: No discrete thyroid nodules. Unremarkable esophagus. No pathologically enlarged axillary, mediastinal or hilar lymph nodes. Lungs/Pleura: No pneumothorax. No right pleural effusion. Trace anterior upper and dependent basilar left pleural effusion, similar. Status post left upper lobectomy. No acute consolidative airspace disease, lung masses or significant pulmonary nodules. Stable tiny calcified posterior right upper lobe granuloma. Upper abdomen: Small hiatal hernia. Mild left colonic diverticulosis. Musculoskeletal: No aggressive appearing focal osseous lesions. Healed deformities in the posterior left seventh, eighth, ninth and tenth ribs. Fixation hardware again noted in anterolateral left fourth and fifth ribs, intact. Marked thoracic spondylosis. IMPRESSION: 1. No evidence of local tumor recurrence status post left upper lobectomy. 2. No evidence of metastatic disease in the chest. 3. Trace left pleural effusion, similar. 4. Two-vessel coronary atherosclerosis. 5. Small hiatal hernia. 6. Mild left colonic diverticulosis. 7. Aortic Atherosclerosis (ICD10-I70.0). Electronically Signed   By: JIlona SorrelM.D.   On: 08/26/2022 11:07     ASSESSMENT/PLAN:  This is  a very pleasant 71year old Caucasian male  diagnosed with stage IIIa (T1c, N2, M0) non-small cell lung cancer, adenocarcinoma.  The patient presented with a left upper lobe lung nodule in addition to mediastinal lymphadenopathy.  He was diagnosed in December 2022.  The patient's PD-L1 expression is 70%.  He has no actionable mutations.  The patient underwent a left upper lobectomy with lymph node dissection under the care of Dr. Kipp Brood.  The resection margins were negative for malignancy and there was no visceral pleural involvement but there was lymphovascular invasion.  Therefore the patient underwent adjuvant systemic chemotherapy with 4 cycles of carboplatin for an AUC of 6 and paclitaxel 200 mg  per metered squared IV every 3 weeks with Neulasta support.  The patient is not a good candidate for cisplatin Alimta because of his renal insufficiency.  He completed this on 03/11/2022.  He tolerated this fairly well except for Neulasta associated arthralgias.  Because of his PD-L1 expression being greater than 70%, Dr. Julien Nordmann recommended adjuvant treatment with immunotherapy with Tecentriq 1200 mg IV every 3 weeks.  The patient is status post 7 cycles.   The patient was seen with Dr. Julien Nordmann today. Dr. Julien Nordmann reviewed from a treatment standpoint, from his clinical judgement, there is no reason that his current treatment with tecentriq would require him to be out of work. Many of his concerns related to working are due to his age, chronic back pain, and lung function. What we can do for him, is write a work note asking for accommodations such as frequent breaks, no heavy lifting, and limited hours. We also can refer him to social work. The patient is not interested as he is already receiving social security. The patient may try to talk to Dr. Kipp Brood regarding his opinion from a surgery standpoint. Labs were reviewed.  Recommend that he proceed with cycle #8 today scheduled.  We will see him back for follow-up visit in 3 weeks for evaluation and repeat blood work before starting cycle #9.   The patient was advised to call immediately if he has any concerning symptoms in the interval. The patient voices understanding of current disease status and treatment options and is in agreement with the current care plan. All questions were answered. The patient knows to call the clinic with any problems, questions or concerns. We can certainly see the patient much sooner if necessary  No orders of the defined types were placed in this encounter.   Chelsey Redondo L Anokhi Shannon, PA-C 09/18/22  ADDENDUM: Hematology/Oncology Attending: I had a face-to-face encounter with the patient today.  I reviewed his  record, lab and recommended his care plan.  This is a 71 years old white male with history of stage IIIa (T1c, N2, M0) non-small cell lung cancer, adenocarcinoma with no actionable mutations and positive PD-L1 expression of 70%.  He status post left upper lobectomy with lymph node dissection under the care of Dr. Kipp Brood followed by 4 cycles of adjuvant systemic chemotherapy with carboplatin and paclitaxel and he is currently undergoing adjuvant treatment with immunotherapy with atezolizumab 1200 Mg IV every 3 weeks status post 7 cycles.  He has been tolerating his treatment well with no concerning adverse effects but the patient continues to ask to take him out of work.  I explained to the patient that he is currently 71 years old and he has [employer] and he would be able to quit work if he wants on his own but I do not see any strong medical reason to take him out of work  at this point unless this is his wishes and he can discuss it with his company. He will proceed with cycle #8 today as planned. We will see the patient back for follow-up visit in 3 weeks for evaluation before the next cycle of his treatment. The patient was advised to call immediately if he has any concerning symptoms in the interval. Disclaimer: This note was dictated with voice recognition software. Similar sounding words can inadvertently be transcribed and may be missed upon review. Eilleen Kempf, MD

## 2022-09-16 ENCOUNTER — Other Ambulatory Visit: Payer: Self-pay

## 2022-09-18 ENCOUNTER — Inpatient Hospital Stay: Payer: Medicare Other

## 2022-09-18 ENCOUNTER — Inpatient Hospital Stay: Payer: Medicare Other | Attending: Internal Medicine | Admitting: Physician Assistant

## 2022-09-18 ENCOUNTER — Other Ambulatory Visit: Payer: Self-pay

## 2022-09-18 VITALS — BP 143/84 | HR 72 | Temp 97.8°F | Resp 14 | Ht 71.0 in | Wt 218.2 lb

## 2022-09-18 DIAGNOSIS — Z79899 Other long term (current) drug therapy: Secondary | ICD-10-CM | POA: Diagnosis not present

## 2022-09-18 DIAGNOSIS — C3412 Malignant neoplasm of upper lobe, left bronchus or lung: Secondary | ICD-10-CM | POA: Diagnosis present

## 2022-09-18 DIAGNOSIS — Z5112 Encounter for antineoplastic immunotherapy: Secondary | ICD-10-CM

## 2022-09-18 DIAGNOSIS — C3492 Malignant neoplasm of unspecified part of left bronchus or lung: Secondary | ICD-10-CM

## 2022-09-18 DIAGNOSIS — N289 Disorder of kidney and ureter, unspecified: Secondary | ICD-10-CM | POA: Insufficient documentation

## 2022-09-18 LAB — CBC WITH DIFFERENTIAL (CANCER CENTER ONLY)
Abs Immature Granulocytes: 0.02 10*3/uL (ref 0.00–0.07)
Basophils Absolute: 0.1 10*3/uL (ref 0.0–0.1)
Basophils Relative: 1 %
Eosinophils Absolute: 0.3 10*3/uL (ref 0.0–0.5)
Eosinophils Relative: 3 %
HCT: 37.5 % — ABNORMAL LOW (ref 39.0–52.0)
Hemoglobin: 12.6 g/dL — ABNORMAL LOW (ref 13.0–17.0)
Immature Granulocytes: 0 %
Lymphocytes Relative: 36 %
Lymphs Abs: 3.7 10*3/uL (ref 0.7–4.0)
MCH: 28.3 pg (ref 26.0–34.0)
MCHC: 33.6 g/dL (ref 30.0–36.0)
MCV: 84.1 fL (ref 80.0–100.0)
Monocytes Absolute: 1 10*3/uL (ref 0.1–1.0)
Monocytes Relative: 9 %
Neutro Abs: 5.3 10*3/uL (ref 1.7–7.7)
Neutrophils Relative %: 51 %
Platelet Count: 209 10*3/uL (ref 150–400)
RBC: 4.46 MIL/uL (ref 4.22–5.81)
RDW: 14.5 % (ref 11.5–15.5)
WBC Count: 10.3 10*3/uL (ref 4.0–10.5)
nRBC: 0 % (ref 0.0–0.2)

## 2022-09-18 LAB — CMP (CANCER CENTER ONLY)
ALT: 16 U/L (ref 0–44)
AST: 20 U/L (ref 15–41)
Albumin: 4.3 g/dL (ref 3.5–5.0)
Alkaline Phosphatase: 77 U/L (ref 38–126)
Anion gap: 11 (ref 5–15)
BUN: 31 mg/dL — ABNORMAL HIGH (ref 8–23)
CO2: 22 mmol/L (ref 22–32)
Calcium: 9.3 mg/dL (ref 8.9–10.3)
Chloride: 104 mmol/L (ref 98–111)
Creatinine: 1.83 mg/dL — ABNORMAL HIGH (ref 0.61–1.24)
GFR, Estimated: 39 mL/min — ABNORMAL LOW (ref >60)
Glucose, Bld: 206 mg/dL — ABNORMAL HIGH (ref 70–99)
Potassium: 3.4 mmol/L — ABNORMAL LOW (ref 3.5–5.1)
Sodium: 137 mmol/L (ref 135–145)
Total Bilirubin: 0.5 mg/dL (ref 0.3–1.2)
Total Protein: 7.3 g/dL (ref 6.5–8.1)

## 2022-09-18 LAB — TSH: TSH: 1.624 u[IU]/mL (ref 0.350–4.500)

## 2022-09-18 IMAGING — CR DG CHEST 2V
2 series · 2 of 2 positions shown · non-contrast
Comparison: 11/20/2021

CLINICAL DATA: Shortness of breath

EXAM:
CHEST - 2 VIEW

[chest lat]
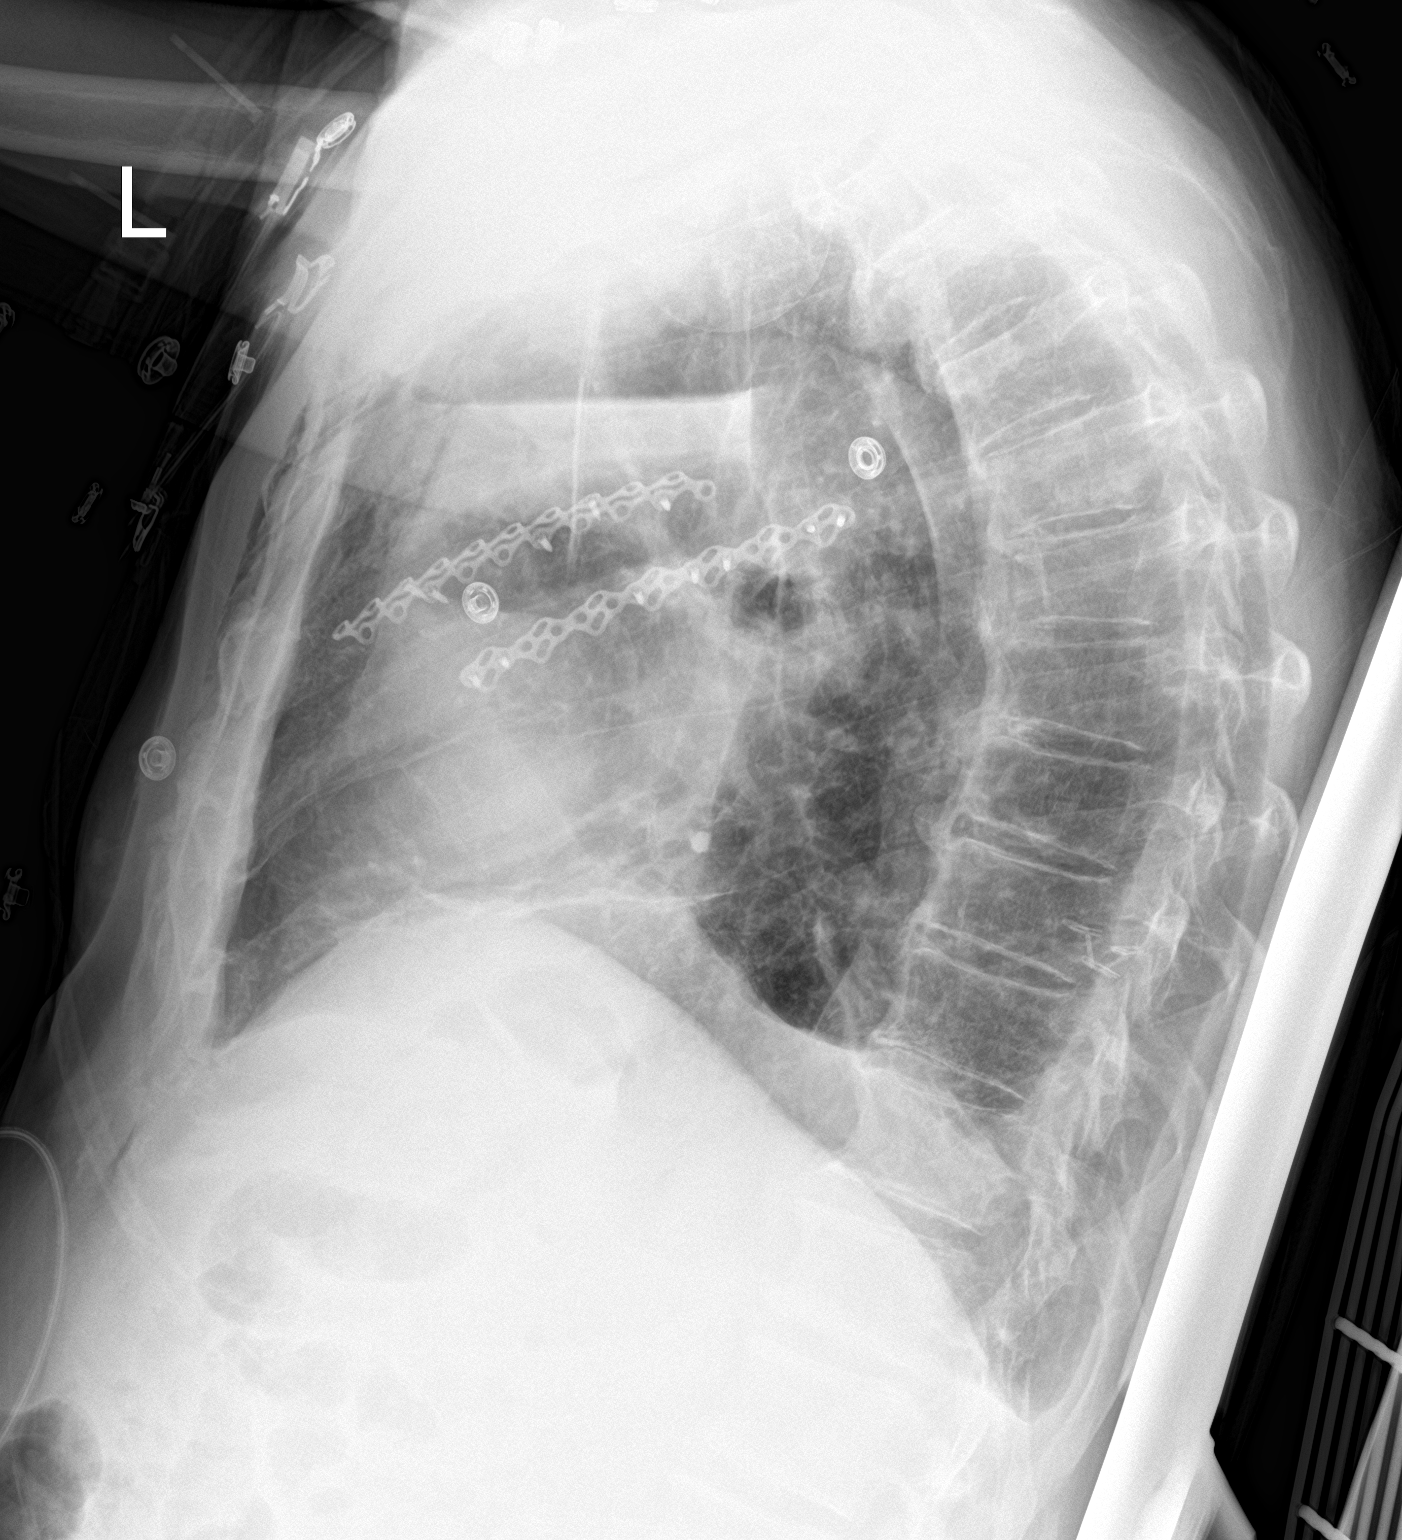

[chest ap]
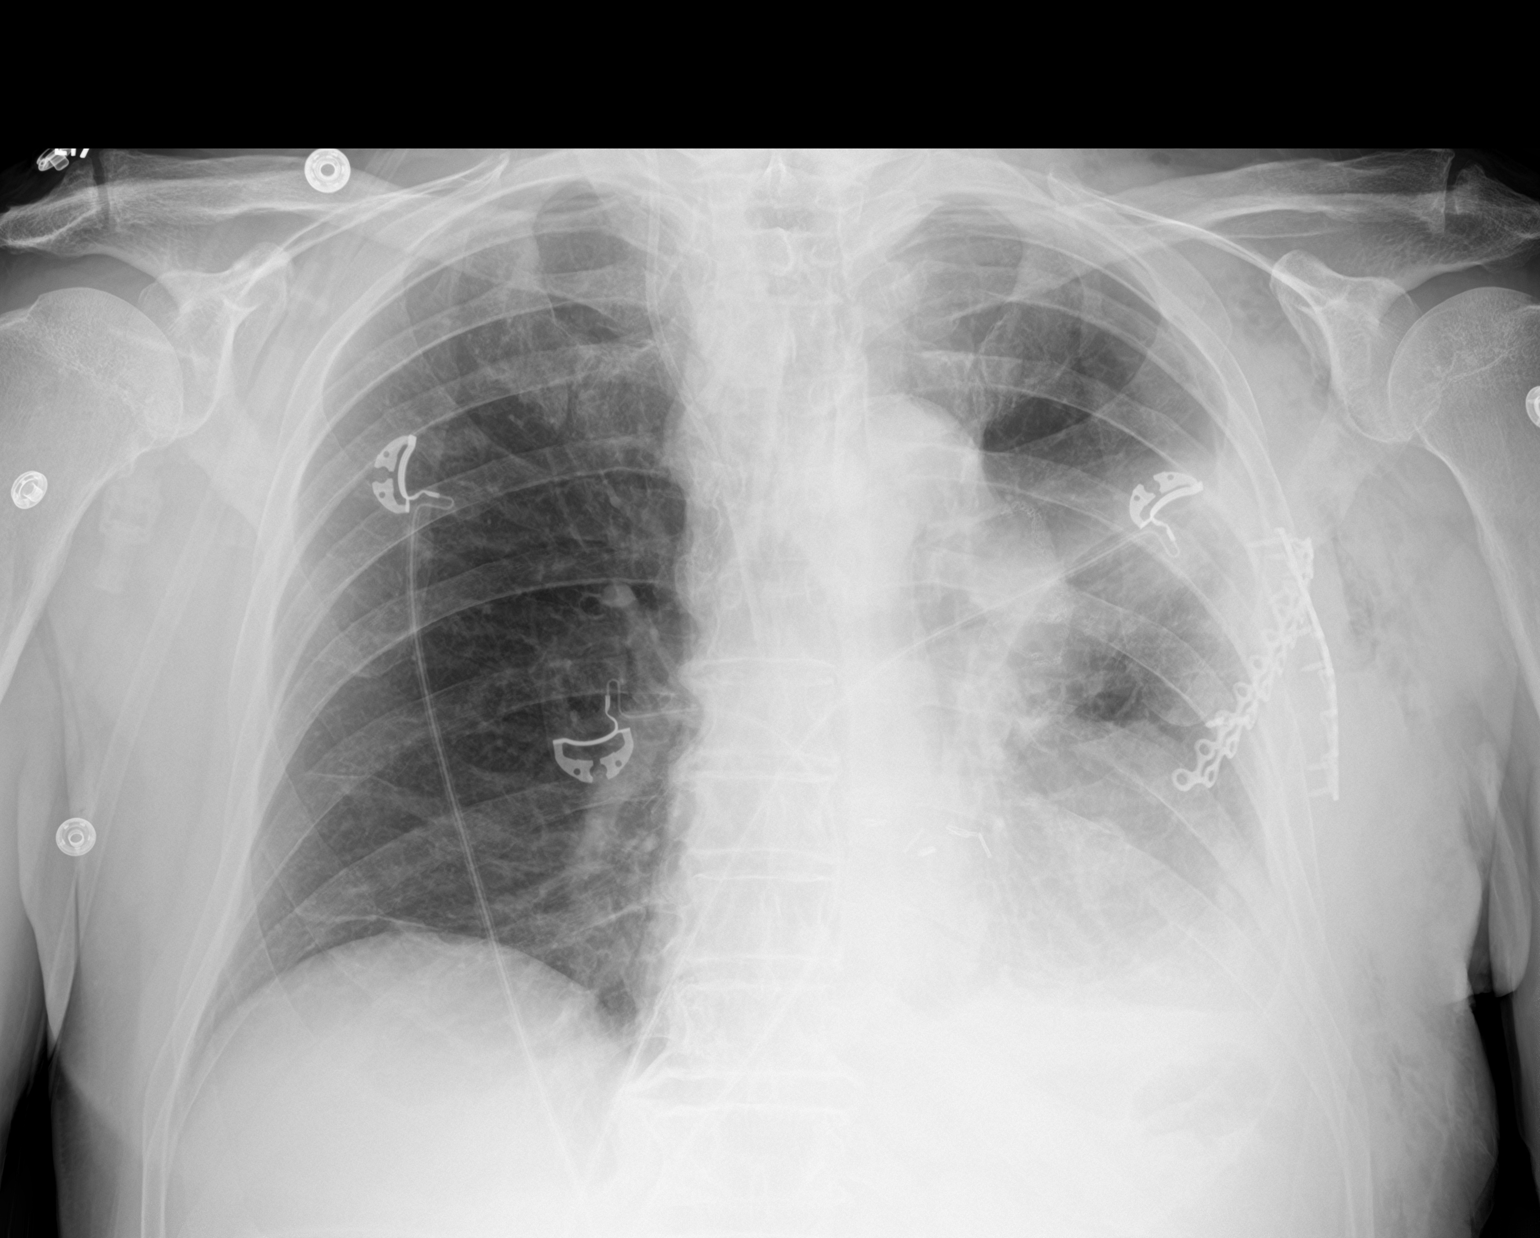

[2 of 2 positions shown; findings below may reference images not displayed]

FINDINGS: Postoperative changes on the left. Subcutaneous air in the left
chest wall decreasing since prior study. Air-fluid level again seen
in the left upper chest, best seen on lateral view compatible with
loculated hydropneumothorax. Airspace opacity throughout the left
lung is unchanged. Right lung clear. Heart is normal size.
IMPRESSION: Postoperative changes on the left with loculated left upper
hemithorax hydropneumothorax, best seen on lateral view.

Stable left lung airspace opacities.

## 2022-09-18 MED ORDER — SODIUM CHLORIDE 0.9 % IV SOLN
1200.0000 mg | Freq: Once | INTRAVENOUS | Status: AC
  Administered 2022-09-18: 1200 mg via INTRAVENOUS
  Filled 2022-09-18: qty 20

## 2022-09-18 MED ORDER — SODIUM CHLORIDE 0.9 % IV SOLN: Freq: Once | INTRAVENOUS | Status: AC

## 2022-09-18 NOTE — Patient Instructions (Signed)
[facility]  Discharge Instructions: Thank you for choosing [facility] to provide your oncology and hematology care.   If you have a lab appointment with the [facility], please go directly to the [facility] and check in at the registration area.   Wear comfortable clothing and clothing appropriate for easy access to any Portacath or PICC line.   We strive to give you quality time with your provider. You may need to reschedule your appointment if you arrive late (15 or more minutes).  Arriving late affects you and other patients whose appointments are after yours.  Also, if you miss three or more appointments without notifying the office, you may be dismissed from the clinic at the provider's discretion.      For prescription refill requests, have your pharmacy contact our office and allow 72 hours for refills to be completed.    Today you received the following chemotherapy and/or immunotherapy agents: atezolizumab      To help prevent nausea and vomiting after your treatment, we encourage you to take your nausea medication as directed.  BELOW ARE SYMPTOMS THAT SHOULD BE REPORTED IMMEDIATELY: *FEVER GREATER THAN 100.4 F (38 C) OR HIGHER *CHILLS OR SWEATING *NAUSEA AND VOMITING THAT IS NOT CONTROLLED WITH YOUR NAUSEA MEDICATION *UNUSUAL SHORTNESS OF BREATH *UNUSUAL BRUISING OR BLEEDING *URINARY PROBLEMS (pain or burning when urinating, or frequent urination) *BOWEL PROBLEMS (unusual diarrhea, constipation, pain near the anus) TENDERNESS IN MOUTH AND THROAT WITH OR WITHOUT PRESENCE OF ULCERS (sore throat, sores in mouth, or a toothache) UNUSUAL RASH, SWELLING OR PAIN  UNUSUAL VAGINAL DISCHARGE OR ITCHING   Items with * indicate a potential emergency and should be followed up as soon as possible or go to the Emergency Department if any problems should occur.  Please show the CHEMOTHERAPY ALERT CARD or IMMUNOTHERAPY ALERT CARD at check-in  to the Emergency Department and triage nurse.  Should you have questions after your visit or need to cancel or reschedule your appointment, please contact [facility]  Dept: [phone]  and follow the prompts.  Office hours are 8:00 a.m. to 4:30 p.m. Monday - Friday. Please note that voicemails left after 4:00 p.m. may not be returned until the following business day.  We are closed weekends and major holidays. You have access to a nurse at all times for urgent questions. Please call the main number to the clinic Dept: [phone] and follow the prompts.   For any non-urgent questions, you may also contact your provider using MyChart. We now offer e-Visits for anyone 69 and older to request care online for non-urgent symptoms. For details visit [email or web address].   Also download the MyChart app! Go to the app store, search "MyChart", open the app, select [facility], and log in with your MyChart username and password.  Masks are optional in the cancer centers. If you would like for your care team to wear a mask while they are taking care of you, please let them know. You may have one support [name] who is at least 70 years old accompany you for your appointments.

## 2022-09-19 ENCOUNTER — Other Ambulatory Visit: Payer: Self-pay

## 2022-09-19 IMAGING — DX DG CHEST 1V PORT
1 series · 1 of 1 positions shown · non-contrast
Comparison: 11/21/2021

CLINICAL DATA: Hypoxia.  History of lung nodule

EXAM:
PORTABLE CHEST 1 VIEW

[chest ap]
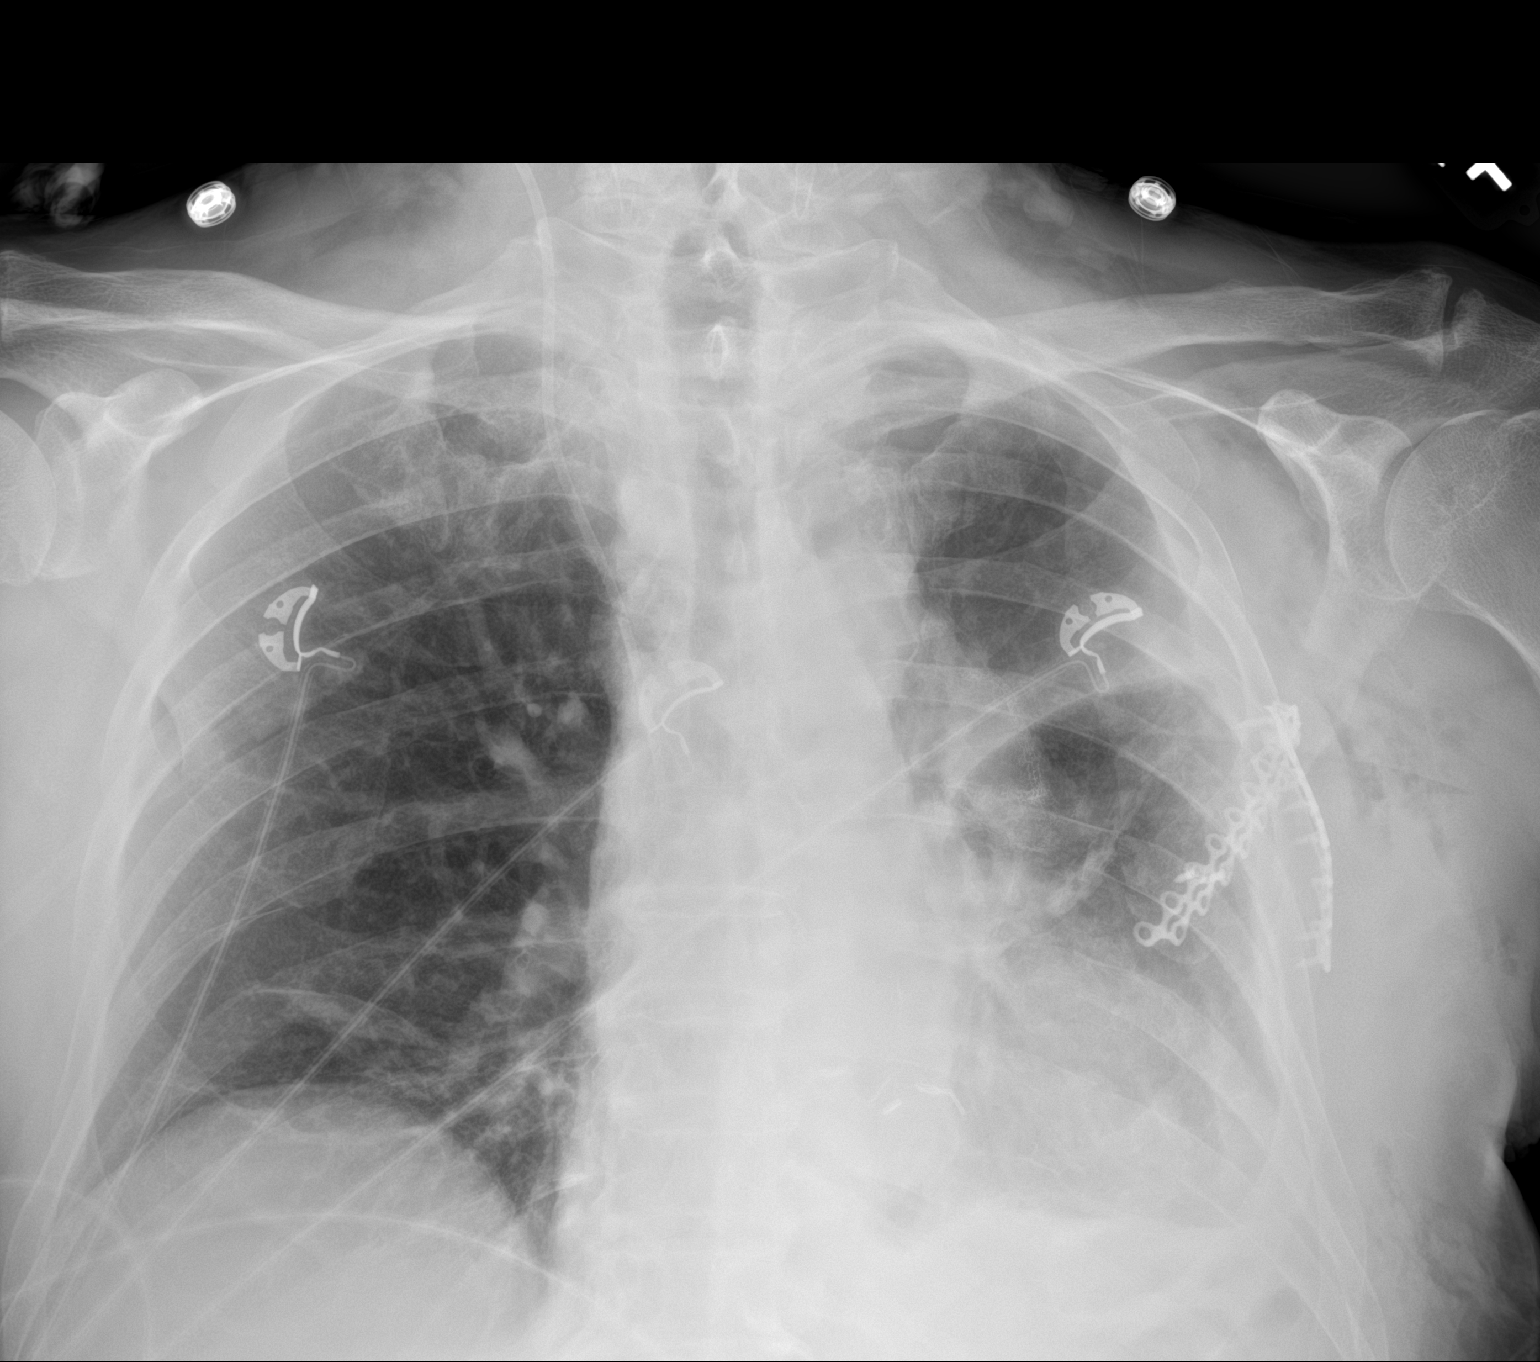

[1 of 1 positions shown; findings below may reference images not displayed]

FINDINGS: Right jugular central venous catheter in the mid SVC unchanged in
position. No right pneumothorax. Minimal right lower lobe
atelectasis

Postsurgical change on the left. Surgical staples left hilum. Left
lower lobe atelectasis and small effusion. Plate and screw
stabilization of 2 left rib fractures unchanged. Subcutaneous gas in
the left chest wall. No pneumothorax identified on this portable
view. Probable left hydropneumothorax in the apex seen on the
previous lateral view.
IMPRESSION: No significant interval change. Bibasilar atelectasis left greater
than right. Small left effusion.

Previously noted left apical hydropneumothorax not seen on the
current study likely due to projection.

## 2022-09-20 ENCOUNTER — Other Ambulatory Visit: Payer: Self-pay

## 2022-09-20 LAB — T4: T4, Total: 5.5 ug/dL (ref 4.5–12.0)

## 2022-09-20 IMAGING — CR DG CHEST 2V
3 series · 3 of 3 positions shown · non-contrast
Comparison: Previous studies including the examination of
11/22/2021

CLINICAL DATA: Recent left lung surgery

EXAM:
CHEST - 2 VIEW

[chest lat (1 of 2)]
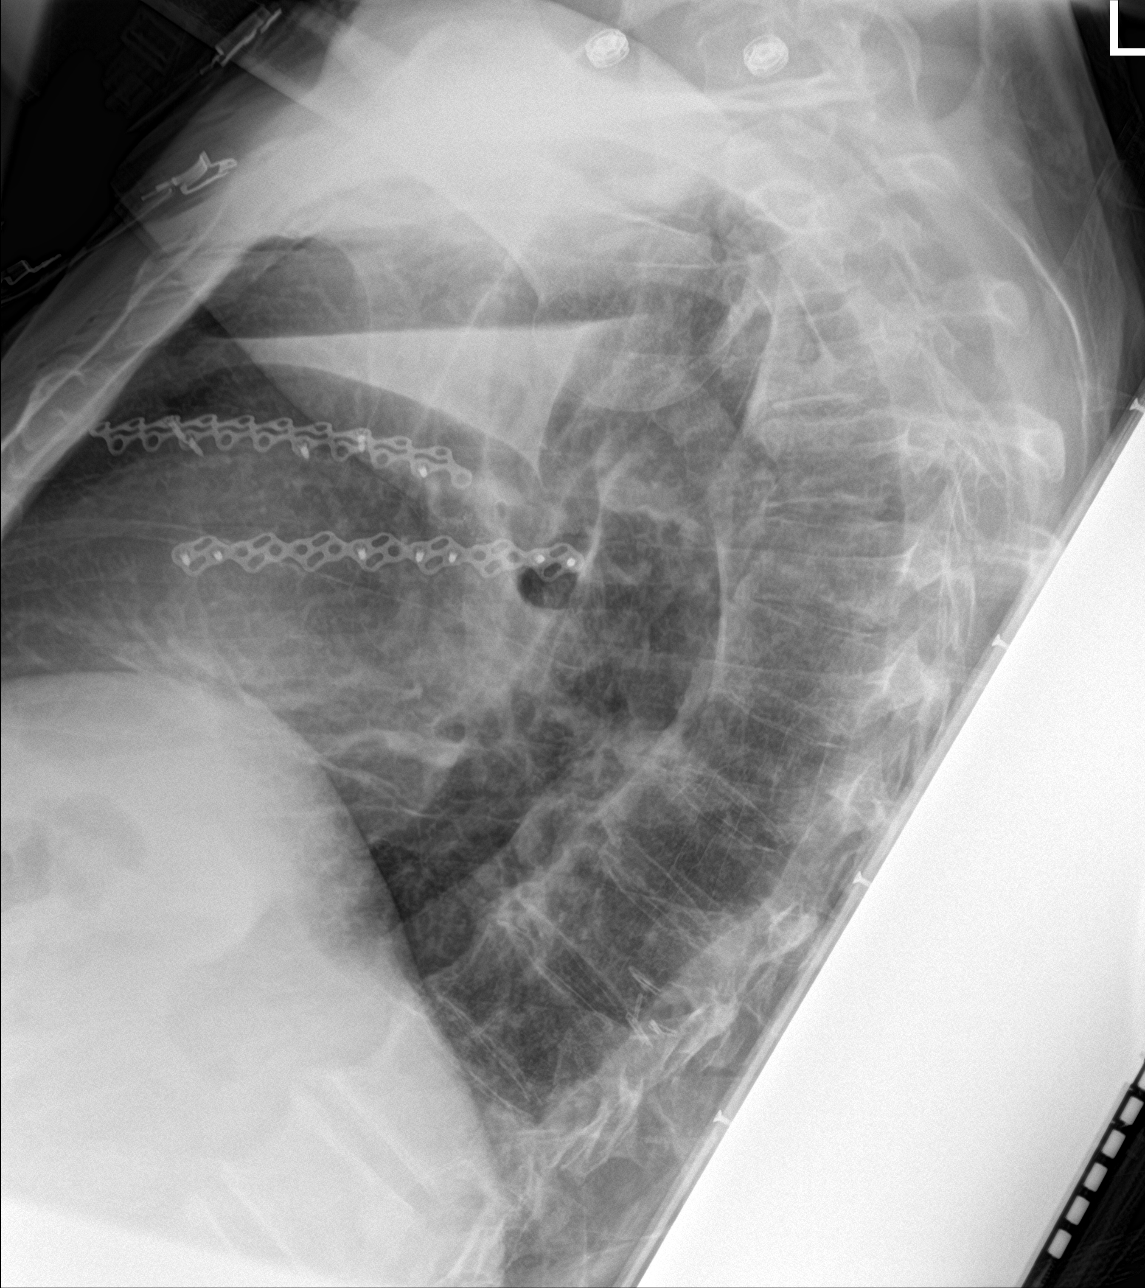

[chest ap]
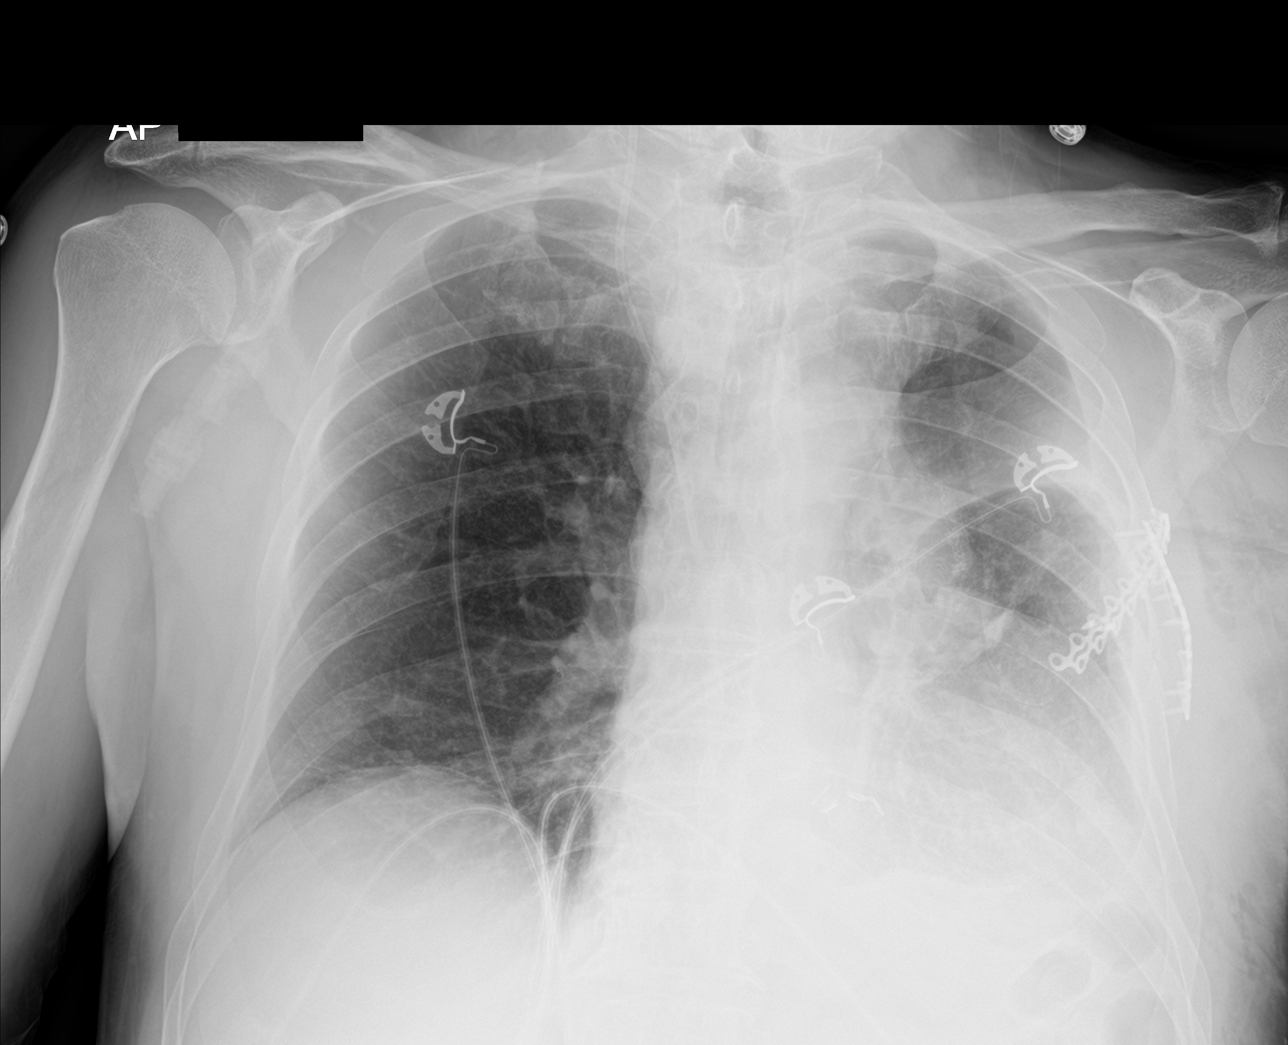

[chest lat (2 of 2)]
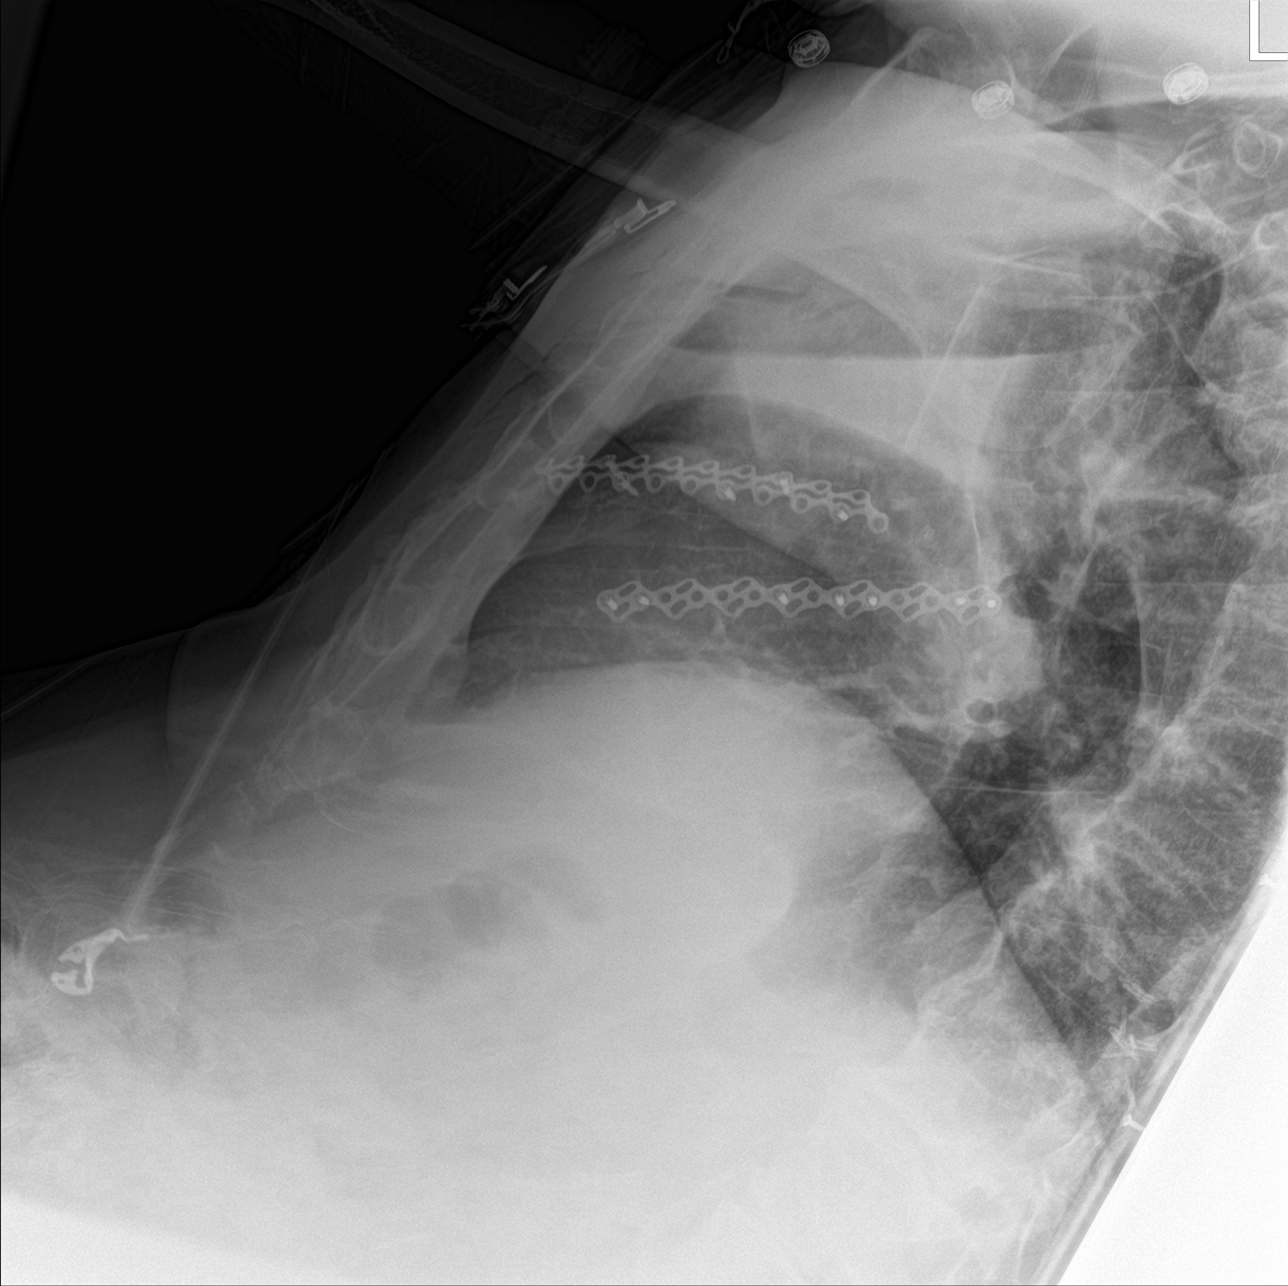

[3 of 3 positions shown; findings below may reference images not displayed]

FINDINGS: Transverse diameter of heart is increased. There is decreased
aeration in the left lung. There is possible loculated effusion in
the left pleural space. Metallic plate and surgical screws seen in
the left ribs. Linear densities seen in the left parahilar region
and lower lung fields. There is some improvement in aeration of
right lower lung fields. Tip of central venous catheter is seen in
the region of superior vena cava. Subcutaneous emphysema is seen in
the left chest wall. There is no demonstrable pneumothorax.
IMPRESSION: Cardiomegaly. Left pleural effusion. There is interval decrease in
subsegmental atelectasis in the right lower lung fields.

## 2022-09-24 ENCOUNTER — Other Ambulatory Visit: Payer: Self-pay

## 2022-10-09 ENCOUNTER — Inpatient Hospital Stay (HOSPITAL_BASED_OUTPATIENT_CLINIC_OR_DEPARTMENT_OTHER): Payer: Medicare Other | Admitting: Internal Medicine

## 2022-10-09 ENCOUNTER — Ambulatory Visit: Payer: Medicare Other

## 2022-10-09 ENCOUNTER — Encounter: Payer: Self-pay | Admitting: Internal Medicine

## 2022-10-09 ENCOUNTER — Inpatient Hospital Stay: Payer: Medicare Other

## 2022-10-09 ENCOUNTER — Other Ambulatory Visit: Payer: Medicare Other

## 2022-10-09 ENCOUNTER — Ambulatory Visit: Payer: Medicare Other | Admitting: Internal Medicine

## 2022-10-09 DIAGNOSIS — Z5112 Encounter for antineoplastic immunotherapy: Secondary | ICD-10-CM | POA: Diagnosis not present

## 2022-10-09 DIAGNOSIS — C3492 Malignant neoplasm of unspecified part of left bronchus or lung: Secondary | ICD-10-CM

## 2022-10-09 LAB — CMP (CANCER CENTER ONLY)
ALT: 18 U/L (ref 0–44)
AST: 18 U/L (ref 15–41)
Albumin: 4.3 g/dL (ref 3.5–5.0)
Alkaline Phosphatase: 86 U/L (ref 38–126)
Anion gap: 13 (ref 5–15)
BUN: 25 mg/dL — ABNORMAL HIGH (ref 8–23)
CO2: 19 mmol/L — ABNORMAL LOW (ref 22–32)
Calcium: 9.4 mg/dL (ref 8.9–10.3)
Chloride: 103 mmol/L (ref 98–111)
Creatinine: 1.45 mg/dL — ABNORMAL HIGH (ref 0.61–1.24)
GFR, Estimated: 52 mL/min — ABNORMAL LOW (ref >60)
Glucose, Bld: 184 mg/dL — ABNORMAL HIGH (ref 70–99)
Potassium: 3.5 mmol/L (ref 3.5–5.1)
Sodium: 135 mmol/L (ref 135–145)
Total Bilirubin: 0.4 mg/dL (ref 0.3–1.2)
Total Protein: 7.8 g/dL (ref 6.5–8.1)

## 2022-10-09 LAB — CBC WITH DIFFERENTIAL (CANCER CENTER ONLY)
Abs Immature Granulocytes: 0.02 10*3/uL (ref 0.00–0.07)
Basophils Absolute: 0.1 10*3/uL (ref 0.0–0.1)
Basophils Relative: 1 %
Eosinophils Absolute: 0.3 10*3/uL (ref 0.0–0.5)
Eosinophils Relative: 3 %
HCT: 37.9 % — ABNORMAL LOW (ref 39.0–52.0)
Hemoglobin: 12.6 g/dL — ABNORMAL LOW (ref 13.0–17.0)
Immature Granulocytes: 0 %
Lymphocytes Relative: 49 %
Lymphs Abs: 4.8 10*3/uL — ABNORMAL HIGH (ref 0.7–4.0)
MCH: 27.9 pg (ref 26.0–34.0)
MCHC: 33.2 g/dL (ref 30.0–36.0)
MCV: 83.8 fL (ref 80.0–100.0)
Monocytes Absolute: 1 10*3/uL (ref 0.1–1.0)
Monocytes Relative: 10 %
Neutro Abs: 3.7 10*3/uL (ref 1.7–7.7)
Neutrophils Relative %: 37 %
Platelet Count: 204 10*3/uL (ref 150–400)
RBC: 4.52 MIL/uL (ref 4.22–5.81)
RDW: 14.5 % (ref 11.5–15.5)
Smear Review: NORMAL
WBC Count: 10 10*3/uL (ref 4.0–10.5)
nRBC: 0 % (ref 0.0–0.2)

## 2022-10-09 LAB — TSH: TSH: 4.412 u[IU]/mL (ref 0.350–4.500)

## 2022-10-09 MED ORDER — SODIUM CHLORIDE 0.9 % IV SOLN: Freq: Once | INTRAVENOUS | Status: AC

## 2022-10-09 MED ORDER — SODIUM CHLORIDE 0.9 % IV SOLN
1200.0000 mg | Freq: Once | INTRAVENOUS | Status: AC
  Administered 2022-10-09: 1200 mg via INTRAVENOUS
  Filled 2022-10-09: qty 20

## 2022-10-09 NOTE — Patient Instructions (Signed)
[name]  Discharge Instructions: Thank you for choosing [facility] to provide your oncology and hematology care.   If you have a lab appointment with the [facility], please go directly to the [facility] and check in at the registration area.   Wear comfortable clothing and clothing appropriate for easy access to any Portacath or PICC line.   We strive to give you quality time with your provider. You may need to reschedule your appointment if you arrive late (15 or more minutes).  Arriving late affects you and other patients whose appointments are after yours.  Also, if you miss three or more appointments without notifying the office, you may be dismissed from the clinic at the provider's discretion.      For prescription refill requests, have your pharmacy contact our office and allow 72 hours for refills to be completed.    Today you received the following chemotherapy and/or immunotherapy agents: atezolizumab      To help prevent nausea and vomiting after your treatment, we encourage you to take your nausea medication as directed.  BELOW ARE SYMPTOMS THAT SHOULD BE REPORTED IMMEDIATELY: *FEVER GREATER THAN 100.4 F (38 C) OR HIGHER *CHILLS OR SWEATING *NAUSEA AND VOMITING THAT IS NOT CONTROLLED WITH YOUR NAUSEA MEDICATION *UNUSUAL SHORTNESS OF BREATH *UNUSUAL BRUISING OR BLEEDING *URINARY PROBLEMS (pain or burning when urinating, or frequent urination) *BOWEL PROBLEMS (unusual diarrhea, constipation, pain near the anus) TENDERNESS IN MOUTH AND THROAT WITH OR WITHOUT PRESENCE OF ULCERS (sore throat, sores in mouth, or a toothache) UNUSUAL RASH, SWELLING OR PAIN  UNUSUAL VAGINAL DISCHARGE OR ITCHING   Items with * indicate a potential emergency and should be followed up as soon as possible or go to the Emergency Department if any problems should occur.  Please show the CHEMOTHERAPY ALERT CARD or IMMUNOTHERAPY ALERT CARD at check-in  to the Emergency Department and triage nurse.  Should you have questions after your visit or need to cancel or reschedule your appointment, please contact [facility]  Dept: [phone]  and follow the prompts.  Office hours are 8:00 a.m. to 4:30 p.m. Monday - Friday. Please note that voicemails left after 4:00 p.m. may not be returned until the following business day.  We are closed weekends and major holidays. You have access to a nurse at all times for urgent questions. Please call the main number to the clinic Dept: [phone] and follow the prompts.   For any non-urgent questions, you may also contact your provider using MyChart. We now offer e-Visits for anyone 7 and older to request care online for non-urgent symptoms. For details visit [email or web address].   Also download the MyChart app! Go to the app store, search "MyChart", open the app, select [facility], and log in with your MyChart username and password.  Masks are optional in the cancer centers. If you would like for your care team to wear a mask while they are taking care of you, please let them know. You may have one support [name] who is at least 70 years old accompany you for your appointments.

## 2022-10-09 NOTE — Progress Notes (Signed)
[facility] [phone]   Fax:([phone]) [place], [place] [address]  DIAGNOSIS: Stage IIIA (T1c, N2, M0) non-small cell lung cancer, moderately differentiated adenocarcinoma diagnosed in December 2022.  PD-L1 expression is 70%.    Molecular study showed no actionable mutation.  It has only MAP2K1  PRIOR THERAPY:  1) Status post robotic assisted left video thoracoscopy with emergency thoracotomy and left upper lobectomy and mediastinal lymph node sampling under the care of Dr. Kipp Brood on November 15, 2021. 2) Adjuvant systemic chemotherapy with carboplatin for AUC of 6 and paclitaxel 200 Mg/M2 with Neulasta support every 3 weeks.  First dose January 08, 2022.  The patient was not eligible for treatment with cisplatin and Alimta because of renal insufficiency.  Status post 4 cycles.  Last dose was given on March 11, 2022.  CURRENT THERAPY: Adjuvant treatment with immunotherapy with atezolizumab 1200 Mg IV every 3 weeks.  First dose 04/23/2022.  Status post 8 cycles.  INTERVAL HISTORY: [name] 71 y.o. male returns to the clinic today for follow-up visit.  The patient is feeling fine today with no concerning complaints.  He denied having any chest pain, shortness of breath, cough or hemoptysis.  He has no nausea, vomiting, diarrhea or constipation.  He has no headache or visual changes.  He denied having any recent weight loss or night sweats.  He continues to tolerate his treatment with atezolizumab fairly well.  The patient is here today for evaluation before starting cycle #9.   MEDICAL HISTORY: Past Medical History:  Diagnosis Date   Diabetes mellitus ([facility])    Dyslipidemia    Hypertension     ALLERGIES:  has No Known Allergies.  MEDICATIONS:  Current Outpatient Medications  Medication Sig Dispense Refill   atenolol (TENORMIN) 100 MG tablet Take 100 mg by mouth daily.      atorvastatin (LIPITOR) 20 MG tablet Take 20 mg by mouth daily.     famotidine (PEPCID) 20 MG tablet Take 20 mg by mouth daily.     gabapentin (NEURONTIN) 300 MG capsule Take 1 capsule (300 mg total) by mouth 3 (three) times daily. 90 capsule 0   losartan-hydrochlorothiazide (HYZAAR) 100-12.5 MG tablet Take 1 tablet by mouth daily.     metFORMIN (GLUCOPHAGE-XR) 500 MG 24 hr tablet Take 1,000 mg by mouth every evening.     methocarbamol (ROBAXIN) 500 MG tablet TAKE ONE TABLET BY MOUTH THREE TIMES A DAY 90 tablet 0   prochlorperazine (COMPAZINE) 10 MG tablet Take 10 mg by mouth every 6 (six) hours as needed for nausea or vomiting. (Patient not taking: Reported on 05/15/2022)     No current facility-administered medications for this visit.    SURGICAL HISTORY:  Past Surgical History:  Procedure Laterality Date   BRONCHIAL BIOPSY  11/15/2021   Procedure: BRONCHIAL BIOPSIES;  Surgeon: Garner Nash, DO;  Location: [facility];  Service: Pulmonary;;   BRONCHIAL BRUSHINGS  11/15/2021   Procedure: BRONCHIAL BRUSHINGS;  Surgeon: Garner Nash, DO;  Location: [facility];  Service: Pulmonary;;   FIDUCIAL MARKER PLACEMENT  11/15/2021   Procedure: FIDUCIAL DYE MARKING;  Surgeon: Garner Nash, DO;  Location: [facility];  Service: Pulmonary;;   INGUINAL HERNIA REPAIR     INTERCOSTAL NERVE BLOCK Left 11/15/2021   Procedure: INTERCOSTAL NERVE BLOCK;  Surgeon: Lajuana Matte, MD;  Location: [facility];  Service: Thoracic;  Laterality: Left;  LOBECTOMY Left 11/15/2021   Procedure: LEFT UPPER LOBECTOMY;  Surgeon: Lajuana Matte, MD;  Location: [facility];  Service: Thoracic;  Laterality: Left;   MEDIASTINAL EXPLORATION Left 11/15/2021   Procedure: MEDIASTINAL LYMPH NODE EXPLORATION;  Surgeon: Lajuana Matte, MD;  Location: [facility];  Service: Thoracic;  Laterality: Left;   NODE DISSECTION Left 11/15/2021   Procedure: NODE DISSECTION;  Surgeon: Lajuana Matte, MD;  Location: [facility];   Service: Thoracic;  Laterality: Left;   RIB PLATING Left 11/15/2021   Procedure: RIB PLATING;  Surgeon: Lajuana Matte, MD;  Location: [facility];  Service: Thoracic;  Laterality: Left;   THORACOTOMY/LOBECTOMY Left 11/15/2021   Procedure: EMERGENCY THORACOTOMY;  Surgeon: Lajuana Matte, MD;  Location: [facility];  Service: Thoracic;  Laterality: Left;   VIDEO BRONCHOSCOPY WITH RADIAL ENDOBRONCHIAL ULTRASOUND  11/15/2021   Procedure: VIDEO BRONCHOSCOPY WITH RADIAL ENDOBRONCHIAL ULTRASOUND;  Surgeon: Garner Nash, DO;  [facility]: [facility];  Service: Pulmonary;;    REVIEW OF SYSTEMS:  A comprehensive review of systems was negative.   PHYSICAL EXAMINATION: General appearance: alert, cooperative, and no distress Head: Normocephalic, without obvious abnormality, atraumatic Neck: no adenopathy, no JVD, supple, symmetrical, trachea midline, and thyroid not enlarged, symmetric, no tenderness/mass/nodules Lymph nodes: Cervical, supraclavicular, and axillary nodes normal. Resp: clear to auscultation bilaterally Back: symmetric, no curvature. ROM normal. No CVA tenderness. Cardio: regular rate and rhythm, S1, S2 normal, no murmur, click, rub or gallop GI: soft, non-tender; bowel sounds normal; no masses,  no organomegaly Extremities: extremities normal, atraumatic, no cyanosis or edema  ECOG PERFORMANCE STATUS: 1 - Symptomatic but completely ambulatory  Blood pressure (!) 140/88, pulse 76, temperature 98.3 F (36.8 C), temperature source Oral, resp. rate 17, height 5' 11"  (1.803 m), weight 220 lb 8 oz (100 kg), SpO2 96 %.  LABORATORY DATA: Lab Results  Component Value Date   WBC 10.0 10/09/2022   HGB 12.6 (L) 10/09/2022   HCT 37.9 (L) 10/09/2022   MCV 83.8 10/09/2022   PLT 204 10/09/2022      Chemistry      Component Value Date/Time   NA 137 09/18/2022 0752   K 3.4 (L) 09/18/2022 0752   CL 104 09/18/2022 0752   CO2 22 09/18/2022 0752   BUN 31 (H) 09/18/2022 0752   CREATININE  1.83 (H) 09/18/2022 0752      Component Value Date/Time   CALCIUM 9.3 09/18/2022 0752   ALKPHOS 77 09/18/2022 0752   AST 20 09/18/2022 0752   ALT 16 09/18/2022 0752   BILITOT 0.5 09/18/2022 0752       RADIOGRAPHIC STUDIES: No results found.  ASSESSMENT AND PLAN: This is a very pleasant 71 years old white male diagnosed with a stage IIIa (T1c, N2, M0) non-small cell lung cancer, adenocarcinoma presented with left upper lobe lung nodule in addition to mediastinal lymphadenopathy diagnosed in December 2022.  The patient is status post left upper lobectomy with lymph node dissection.  The resection margins were negative for malignancy and there was no visceral pleural involvement but lymphovascular invasion. The patient has no actionable mutations and PD-L1 expression was 70% He completed 4 cycles of adjuvant systemic chemotherapy with carboplatin and paclitaxel.  He was not a good candidate for treatment with Alimta because of renal insufficiency. The patient is currently undergoing adjuvant treatment with immunotherapy with atezolizumab 1200 Mg IV every 3 weeks status post 8 cycles.   He has been tolerating this treatment fairly well with no concerning adverse effects. I  recommended for the patient to proceed with cycle #9 today as planned. I will see him back for follow-up visit in 3 weeks for evaluation before the next cycle of his treatment. He was advised to call immediately if he has any other concerning symptoms in the interval. The patient voices understanding of current disease status and treatment options and is in agreement with the current care plan.  All questions were answered. The patient knows to call the clinic with any problems, questions or concerns. We can certainly see the patient much sooner if necessary.   Disclaimer: This note was dictated with voice recognition software. Similar sounding words can inadvertently be transcribed and may not be corrected upon  review.

## 2022-10-10 LAB — T4: T4, Total: 5.4 ug/dL (ref 4.5–12.0)

## 2022-10-14 ENCOUNTER — Other Ambulatory Visit: Payer: Self-pay

## 2022-10-18 ENCOUNTER — Telehealth: Payer: Self-pay | Admitting: Internal Medicine

## 2022-10-18 NOTE — Telephone Encounter (Signed)
Called patient regarding upcoming November and December appointments, patient is notified.

## 2022-10-22 ENCOUNTER — Other Ambulatory Visit: Payer: Self-pay

## 2022-10-26 NOTE — Progress Notes (Unsigned)
[facility] OFFICE PROGRESS NOTE  Kristopher Bears, MD [address] [place] [address]  DIAGNOSIS: Stage IIIA (T1c, N2, M0) non-small cell lung cancer, moderately differentiated adenocarcinoma diagnosed in December 2022.   PD-L1 expression is 70%.     Molecular study showed no actionable mutation.  It has only MAP2K1  PRIOR THERAPY: 1) Status post robotic assisted left video thoracoscopy with emergency thoracotomy and left upper lobectomy and mediastinal lymph node sampling under the care of Dr. Kipp Brood on November 15, 2021. 2) Adjuvant systemic chemotherapy with carboplatin for AUC of 6 and paclitaxel 200 Mg/M2 with Neulasta support every 3 weeks.  First dose January 08, 2022.  The patient was not eligible for treatment with cisplatin and Alimta because of renal insufficiency.  Status post 4 cycles.  Last dose was given on March 11, 2022.  CURRENT THERAPY: Adjuvant treatment with immunotherapy with atezolizumab 1200 Mg IV every 3 weeks.  First dose 04/23/2022.  Status post 9 cycles   INTERVAL HISTORY: [name] 71 y.o. male returns  to the clinic today for a follow-up visit.  The patient is currently undergoing treatment with adjuvant immunotherapy with Tecentriq.  He is status post 9 cycles.  Has been tolerating it fairly well.  He denies any new concerning complaints today.  The patient has stable fatigue and decreased exercise tolerance.  He  saw Dr. Kipp Brood a few months ago who recommended that he enroll in an exercise program.  I previously gave the patient the number to their clinic to reschedule at previous visits. He states he is unable to exercise because of his breathing.    He denies any fever or chills.  He reports a good appetite and gained weight since last being seen.  He has some left-sided chest discomfort over the surgical site for which he is prescribed Robaxin. He reports stable baseline dyspnea on exertion. Denies significant cough  unless he has phlegm in his throat he needs to clear.  Denies hemoptysis.  Denies any nausea or vomiting.  Denies any diarrhea or constipation.  He denies any headache or visual changes. He has some dry skin on his face for which he uses lotion.  He is here today for evaluation and repeat blood work before undergoing cycle #10.       MEDICAL HISTORY: Past Medical History:  Diagnosis Date   Diabetes mellitus ([facility])    Dyslipidemia    Hypertension     ALLERGIES:  has No Known Allergies.  MEDICATIONS:  Current Outpatient Medications  Medication Sig Dispense Refill   atenolol (TENORMIN) 100 MG tablet Take 100 mg by mouth daily.     atorvastatin (LIPITOR) 20 MG tablet Take 20 mg by mouth daily.     famotidine (PEPCID) 20 MG tablet Take 20 mg by mouth daily.     famotidine (PEPCID) 40 MG tablet Take 40 mg by mouth daily.     gabapentin (NEURONTIN) 300 MG capsule Take 1 capsule (300 mg total) by mouth 3 (three) times daily. 90 capsule 0   losartan-hydrochlorothiazide (HYZAAR) 100-12.5 MG tablet Take 1 tablet by mouth daily.     metFORMIN (GLUCOPHAGE-XR) 500 MG 24 hr tablet Take 1,000 mg by mouth every evening.     methocarbamol (ROBAXIN) 500 MG tablet TAKE ONE TABLET BY MOUTH THREE TIMES A DAY 90 tablet 0   No current facility-administered medications for this visit.    SURGICAL HISTORY:  Past Surgical History:  Procedure Laterality Date   BRONCHIAL BIOPSY  11/15/2021   Procedure: BRONCHIAL BIOPSIES;  Surgeon: Garner Nash, DO;  Location: [facility];  Service: Pulmonary;;   BRONCHIAL BRUSHINGS  11/15/2021   Procedure: BRONCHIAL BRUSHINGS;  Surgeon: Garner Nash, DO;  Location: [facility];  Service: Pulmonary;;   FIDUCIAL MARKER PLACEMENT  11/15/2021   Procedure: FIDUCIAL DYE MARKING;  Surgeon: Garner Nash, DO;  Location: [facility];  Service: Pulmonary;;   INGUINAL HERNIA REPAIR     INTERCOSTAL NERVE BLOCK Left 11/15/2021   Procedure: INTERCOSTAL NERVE BLOCK;   Surgeon: Lajuana Matte, MD;  Location: [facility];  Service: Thoracic;  Laterality: Left;   LOBECTOMY Left 11/15/2021   Procedure: LEFT UPPER LOBECTOMY;  Surgeon: Lajuana Matte, MD;  Location: [facility];  Service: Thoracic;  Laterality: Left;   MEDIASTINAL EXPLORATION Left 11/15/2021   Procedure: MEDIASTINAL LYMPH NODE EXPLORATION;  Surgeon: Lajuana Matte, MD;  Location: [facility];  Service: Thoracic;  Laterality: Left;   NODE DISSECTION Left 11/15/2021   Procedure: NODE DISSECTION;  Surgeon: Lajuana Matte, MD;  Location: [facility];  Service: Thoracic;  Laterality: Left;   RIB PLATING Left 11/15/2021   Procedure: RIB PLATING;  Surgeon: Lajuana Matte, MD;  Location: [facility];  Service: Thoracic;  Laterality: Left;   THORACOTOMY/LOBECTOMY Left 11/15/2021   Procedure: EMERGENCY THORACOTOMY;  Surgeon: Lajuana Matte, MD;  Location: [facility];  Service: Thoracic;  Laterality: Left;   VIDEO BRONCHOSCOPY WITH RADIAL ENDOBRONCHIAL ULTRASOUND  11/15/2021   Procedure: VIDEO BRONCHOSCOPY WITH RADIAL ENDOBRONCHIAL ULTRASOUND;  Surgeon: Garner Nash, DO;  Location: [facility];  Service: Pulmonary;;    REVIEW OF SYSTEMS:   Review of Systems  Constitutional: Negative for appetite change, chills, fatigue, fever and unexpected weight change.  HENT:   Negative for mouth sores, nosebleeds, sore throat and trouble swallowing.   Eyes: Negative for eye problems and icterus.  Respiratory: Negative for cough, hemoptysis, shortness of breath and wheezing.   Cardiovascular: Negative for chest pain and leg swelling.  Gastrointestinal: Negative for abdominal pain, constipation, diarrhea, nausea and vomiting.  Genitourinary: Negative for bladder incontinence, difficulty urinating, dysuria, frequency and hematuria.   Musculoskeletal: Negative for back pain, gait problem, neck pain and neck stiffness.  Skin: Negative for itching and rash.  Neurological: Negative for dizziness, extremity weakness, gait  problem, headaches, light-headedness and seizures.  Hematological: Negative for adenopathy. Does not bruise/bleed easily.  Psychiatric/Behavioral: Negative for confusion, depression and sleep disturbance. The patient is not nervous/anxious.     PHYSICAL EXAMINATION:  There were no vitals taken for this visit.  ECOG PERFORMANCE STATUS: {CHL ONC ECOG [id number]  Physical Exam  Constitutional: Oriented to person, place, and time and well-developed, well-nourished, and in no distress. No distress.  HENT:  Head: Normocephalic and atraumatic.  Mouth/Throat: Oropharynx is clear and moist. No oropharyngeal exudate.  Eyes: Conjunctivae are normal. Right eye exhibits no discharge. Left eye exhibits no discharge. No scleral icterus.  Neck: Normal range of motion. Neck supple.  Cardiovascular: Normal rate, regular rhythm, normal heart sounds and intact distal pulses.   Pulmonary/Chest: Effort normal and breath sounds normal. No respiratory distress. No wheezes. No rales.  Abdominal: Soft. Bowel sounds are normal. Exhibits no distension and no mass. There is no tenderness.  Musculoskeletal: Normal range of motion. Exhibits no edema.  Lymphadenopathy:    No cervical adenopathy.  Neurological: Alert and oriented to person, place, and time. Exhibits normal muscle tone. Gait normal. Coordination normal.  Skin: Skin is warm and dry. No rash noted.  Not diaphoretic. No erythema. No pallor.  Psychiatric: Mood, memory and judgment normal.  Vitals reviewed.  LABORATORY DATA: Lab Results  Component Value Date   WBC 10.0 10/09/2022   HGB 12.6 (L) 10/09/2022   HCT 37.9 (L) 10/09/2022   MCV 83.8 10/09/2022   PLT 204 10/09/2022      Chemistry      Component Value Date/Time   NA 135 10/09/2022 0746   K 3.5 10/09/2022 0746   CL 103 10/09/2022 0746   CO2 19 (L) 10/09/2022 0746   BUN 25 (H) 10/09/2022 0746   CREATININE 1.45 (H) 10/09/2022 0746      Component Value Date/Time   CALCIUM 9.4  10/09/2022 0746   ALKPHOS 86 10/09/2022 0746   AST 18 10/09/2022 0746   ALT 18 10/09/2022 0746   BILITOT 0.4 10/09/2022 0746       RADIOGRAPHIC STUDIES:  No results found.   ASSESSMENT/PLAN:  This is  a very pleasant 71 year old Caucasian male diagnosed with stage IIIa (T1c, N2, M0) non-small cell lung cancer, adenocarcinoma.  The patient presented with a left upper lobe lung nodule in addition to mediastinal lymphadenopathy.  He was diagnosed in December 2022.  The patient's PD-L1 expression is 70%.  He has no actionable mutations.  The patient underwent a left upper lobectomy with lymph node dissection under the care of Dr. Kipp Brood.  The resection margins were negative for malignancy and there was no visceral pleural involvement but there was lymphovascular invasion.   Therefore the patient underwent adjuvant systemic chemotherapy with 4 cycles of carboplatin for an AUC of 6 and paclitaxel 200 mg per metered squared IV every 3 weeks with Neulasta support.  The patient is not a good candidate for cisplatin Alimta because of his renal insufficiency.  He completed this on 03/11/2022.  He tolerated this fairly well except for Neulasta associated arthralgias.   Because of his PD-L1 expression being greater than 70%, Dr. Julien Nordmann recommended adjuvant treatment with immunotherapy with Tecentriq 1200 mg IV every 3 weeks.  The patient is status post  9 cycles.   The patient was seen with Dr. Julien Nordmann. Labs were reviewed. Recommend that he *** with cycle 10 today as scheduled.   We will see him back for follow-up visit in 3 weeks for evaluation and repeat blood work before starting cycle #11  The patient was advised to call immediately if he has any concerning symptoms in the interval. The patient voices understanding of current disease status and treatment options and is in agreement with the current care plan. All questions were answered. The patient knows to call the clinic with any problems,  questions or concerns. We can certainly see the patient much sooner if necessary              No orders of the defined types were placed in this encounter.    I spent {CHL ONC TIME VISIT - JKKXF:[id number]} counseling the patient face to face. The total time spent in the appointment was {CHL ONC TIME VISIT - RCVEL:[id number]}.  Kristopher Banwart L Rex Oesterle, PA-C 10/26/22

## 2022-10-30 ENCOUNTER — Inpatient Hospital Stay: Payer: Medicare Other | Attending: Internal Medicine

## 2022-10-30 ENCOUNTER — Inpatient Hospital Stay (HOSPITAL_BASED_OUTPATIENT_CLINIC_OR_DEPARTMENT_OTHER): Payer: Medicare Other | Admitting: Physician Assistant

## 2022-10-30 ENCOUNTER — Inpatient Hospital Stay: Payer: Medicare Other

## 2022-10-30 VITALS — BP 135/67 | HR 70 | Temp 97.6°F | Resp 17 | Ht 71.0 in | Wt 221.3 lb

## 2022-10-30 DIAGNOSIS — I10 Essential (primary) hypertension: Secondary | ICD-10-CM | POA: Insufficient documentation

## 2022-10-30 DIAGNOSIS — E119 Type 2 diabetes mellitus without complications: Secondary | ICD-10-CM | POA: Diagnosis not present

## 2022-10-30 DIAGNOSIS — C3492 Malignant neoplasm of unspecified part of left bronchus or lung: Secondary | ICD-10-CM | POA: Diagnosis not present

## 2022-10-30 DIAGNOSIS — R21 Rash and other nonspecific skin eruption: Secondary | ICD-10-CM | POA: Diagnosis not present

## 2022-10-30 DIAGNOSIS — C3412 Malignant neoplasm of upper lobe, left bronchus or lung: Secondary | ICD-10-CM | POA: Diagnosis present

## 2022-10-30 DIAGNOSIS — N289 Disorder of kidney and ureter, unspecified: Secondary | ICD-10-CM | POA: Diagnosis not present

## 2022-10-30 DIAGNOSIS — Z5112 Encounter for antineoplastic immunotherapy: Secondary | ICD-10-CM | POA: Insufficient documentation

## 2022-10-30 DIAGNOSIS — Z79899 Other long term (current) drug therapy: Secondary | ICD-10-CM | POA: Diagnosis not present

## 2022-10-30 LAB — CBC WITH DIFFERENTIAL (CANCER CENTER ONLY)
Abs Immature Granulocytes: 0.03 10*3/uL (ref 0.00–0.07)
Basophils Absolute: 0.1 10*3/uL (ref 0.0–0.1)
Basophils Relative: 1 %
Eosinophils Absolute: 0.3 10*3/uL (ref 0.0–0.5)
Eosinophils Relative: 4 %
HCT: 35.2 % — ABNORMAL LOW (ref 39.0–52.0)
Hemoglobin: 11.9 g/dL — ABNORMAL LOW (ref 13.0–17.0)
Immature Granulocytes: 0 %
Lymphocytes Relative: 44 %
Lymphs Abs: 3.3 10*3/uL (ref 0.7–4.0)
MCH: 28.4 pg (ref 26.0–34.0)
MCHC: 33.8 g/dL (ref 30.0–36.0)
MCV: 84 fL (ref 80.0–100.0)
Monocytes Absolute: 1 10*3/uL (ref 0.1–1.0)
Monocytes Relative: 13 %
Neutro Abs: 2.9 10*3/uL (ref 1.7–7.7)
Neutrophils Relative %: 38 %
Platelet Count: 230 10*3/uL (ref 150–400)
RBC: 4.19 MIL/uL — ABNORMAL LOW (ref 4.22–5.81)
RDW: 14.7 % (ref 11.5–15.5)
WBC Count: 7.6 10*3/uL (ref 4.0–10.5)
nRBC: 0 % (ref 0.0–0.2)

## 2022-10-30 LAB — CMP (CANCER CENTER ONLY)
ALT: 19 U/L (ref 0–44)
AST: 22 U/L (ref 15–41)
Albumin: 3.8 g/dL (ref 3.5–5.0)
Alkaline Phosphatase: 73 U/L (ref 38–126)
Anion gap: 8 (ref 5–15)
BUN: 35 mg/dL — ABNORMAL HIGH (ref 8–23)
CO2: 24 mmol/L (ref 22–32)
Calcium: 8.9 mg/dL (ref 8.9–10.3)
Chloride: 105 mmol/L (ref 98–111)
Creatinine: 1.34 mg/dL — ABNORMAL HIGH (ref 0.61–1.24)
GFR, Estimated: 57 mL/min — ABNORMAL LOW (ref >60)
Glucose, Bld: 200 mg/dL — ABNORMAL HIGH (ref 70–99)
Potassium: 3.8 mmol/L (ref 3.5–5.1)
Sodium: 137 mmol/L (ref 135–145)
Total Bilirubin: 0.4 mg/dL (ref 0.3–1.2)
Total Protein: 7.7 g/dL (ref 6.5–8.1)

## 2022-10-30 LAB — TSH: TSH: 2.461 u[IU]/mL (ref 0.350–4.500)

## 2022-10-30 MED ORDER — TRIAMCINOLONE ACETONIDE 0.1 % EX CREA: 1.0000 | TOPICAL_CREAM | Freq: Two times a day (BID) | CUTANEOUS | 2 refills | Status: DC

## 2022-10-30 MED ORDER — SODIUM CHLORIDE 0.9 % IV SOLN
1200.0000 mg | Freq: Once | INTRAVENOUS | Status: AC
  Administered 2022-10-30: 1200 mg via INTRAVENOUS
  Filled 2022-10-30: qty 20

## 2022-10-30 MED ORDER — SODIUM CHLORIDE 0.9 % IV SOLN: Freq: Once | INTRAVENOUS | Status: AC

## 2022-10-30 NOTE — Patient Instructions (Signed)
[facility]  Discharge Instructions: Thank you for choosing [facility] to provide your oncology and hematology care.   If you have a lab appointment with the [facility], please go directly to the [facility] and check in at the registration area.   Wear comfortable clothing and clothing appropriate for easy access to any Portacath or PICC line.   We strive to give you quality time with your provider. You may need to reschedule your appointment if you arrive late (15 or more minutes).  Arriving late affects you and other patients whose appointments are after yours.  Also, if you miss three or more appointments without notifying the office, you may be dismissed from the clinic at the provider's discretion.      For prescription refill requests, have your pharmacy contact our office and allow 72 hours for refills to be completed.    Today you received the following chemotherapy and/or immunotherapy agents :  Atezolizumab       To help prevent nausea and vomiting after your treatment, we encourage you to take your nausea medication as directed.  BELOW ARE SYMPTOMS THAT SHOULD BE REPORTED IMMEDIATELY: *FEVER GREATER THAN 100.4 F (38 C) OR HIGHER *CHILLS OR SWEATING *NAUSEA AND VOMITING THAT IS NOT CONTROLLED WITH YOUR NAUSEA MEDICATION *UNUSUAL SHORTNESS OF BREATH *UNUSUAL BRUISING OR BLEEDING *URINARY PROBLEMS (pain or burning when urinating, or frequent urination) *BOWEL PROBLEMS (unusual diarrhea, constipation, pain near the anus) TENDERNESS IN MOUTH AND THROAT WITH OR WITHOUT PRESENCE OF ULCERS (sore throat, sores in mouth, or a toothache) UNUSUAL RASH, SWELLING OR PAIN  UNUSUAL VAGINAL DISCHARGE OR ITCHING   Items with * indicate a potential emergency and should be followed up as soon as possible or go to the Emergency Department if any problems should occur.  Please show the CHEMOTHERAPY ALERT CARD or IMMUNOTHERAPY ALERT CARD at  check-in to the Emergency Department and triage nurse.  Should you have questions after your visit or need to cancel or reschedule your appointment, please contact [facility]  Dept: [phone]  and follow the prompts.  Office hours are 8:00 a.m. to 4:30 p.m. Monday - Friday. Please note that voicemails left after 4:00 p.m. may not be returned until the following business day.  We are closed weekends and major holidays. You have access to a nurse at all times for urgent questions. Please call the main number to the clinic Dept: [phone] and follow the prompts.   For any non-urgent questions, you may also contact your provider using MyChart. We now offer e-Visits for anyone 46 and older to request care online for non-urgent symptoms. For details visit [email or web address].   Also download the MyChart app! Go to the app store, search "MyChart", open the app, select [facility], and log in with your MyChart username and password.  Masks are optional in the cancer centers. If you would like for your care team to wear a mask while they are taking care of you, please let them know. You may have one support person who is at least 70 years old accompany you for your appointments.

## 2022-10-31 LAB — T4: T4, Total: 6 ug/dL (ref 4.5–12.0)

## 2022-11-14 ENCOUNTER — Other Ambulatory Visit: Payer: Self-pay

## 2022-11-16 ENCOUNTER — Other Ambulatory Visit: Payer: Self-pay

## 2022-11-20 ENCOUNTER — Other Ambulatory Visit: Payer: Self-pay

## 2022-11-20 ENCOUNTER — Inpatient Hospital Stay: Payer: Medicare Other

## 2022-11-20 ENCOUNTER — Inpatient Hospital Stay (HOSPITAL_BASED_OUTPATIENT_CLINIC_OR_DEPARTMENT_OTHER): Payer: Medicare Other | Admitting: Internal Medicine

## 2022-11-20 ENCOUNTER — Ambulatory Visit: Payer: Medicare Other | Admitting: Internal Medicine

## 2022-11-20 ENCOUNTER — Inpatient Hospital Stay: Payer: Medicare Other | Attending: Internal Medicine

## 2022-11-20 ENCOUNTER — Other Ambulatory Visit: Payer: Medicare Other

## 2022-11-20 ENCOUNTER — Ambulatory Visit: Payer: Medicare Other

## 2022-11-20 VITALS — BP 143/96 | HR 75 | Temp 97.8°F | Resp 18

## 2022-11-20 VITALS — BP 157/93 | HR 82 | Temp 97.6°F | Resp 15 | Wt 220.9 lb

## 2022-11-20 DIAGNOSIS — C3492 Malignant neoplasm of unspecified part of left bronchus or lung: Secondary | ICD-10-CM

## 2022-11-20 DIAGNOSIS — Z79899 Other long term (current) drug therapy: Secondary | ICD-10-CM | POA: Diagnosis not present

## 2022-11-20 DIAGNOSIS — C3412 Malignant neoplasm of upper lobe, left bronchus or lung: Secondary | ICD-10-CM | POA: Diagnosis present

## 2022-11-20 DIAGNOSIS — Z5112 Encounter for antineoplastic immunotherapy: Secondary | ICD-10-CM | POA: Diagnosis present

## 2022-11-20 DIAGNOSIS — I10 Essential (primary) hypertension: Secondary | ICD-10-CM | POA: Diagnosis not present

## 2022-11-20 DIAGNOSIS — N289 Disorder of kidney and ureter, unspecified: Secondary | ICD-10-CM | POA: Insufficient documentation

## 2022-11-20 LAB — CBC WITH DIFFERENTIAL (CANCER CENTER ONLY)
Abs Immature Granulocytes: 0.02 10*3/uL (ref 0.00–0.07)
Basophils Absolute: 0.1 10*3/uL (ref 0.0–0.1)
Basophils Relative: 1 %
Eosinophils Absolute: 0.2 10*3/uL (ref 0.0–0.5)
Eosinophils Relative: 3 %
HCT: 37.5 % — ABNORMAL LOW (ref 39.0–52.0)
Hemoglobin: 12.4 g/dL — ABNORMAL LOW (ref 13.0–17.0)
Immature Granulocytes: 0 %
Lymphocytes Relative: 42 %
Lymphs Abs: 3.2 10*3/uL (ref 0.7–4.0)
MCH: 27.7 pg (ref 26.0–34.0)
MCHC: 33.1 g/dL (ref 30.0–36.0)
MCV: 83.9 fL (ref 80.0–100.0)
Monocytes Absolute: 1 10*3/uL (ref 0.1–1.0)
Monocytes Relative: 13 %
Neutro Abs: 3.2 10*3/uL (ref 1.7–7.7)
Neutrophils Relative %: 41 %
Platelet Count: 202 10*3/uL (ref 150–400)
RBC: 4.47 MIL/uL (ref 4.22–5.81)
RDW: 15.4 % (ref 11.5–15.5)
WBC Count: 7.7 10*3/uL (ref 4.0–10.5)
nRBC: 0 % (ref 0.0–0.2)

## 2022-11-20 LAB — CMP (CANCER CENTER ONLY)
ALT: 15 U/L (ref 0–44)
AST: 19 U/L (ref 15–41)
Albumin: 4.2 g/dL (ref 3.5–5.0)
Alkaline Phosphatase: 83 U/L (ref 38–126)
Anion gap: 13 (ref 5–15)
BUN: 29 mg/dL — ABNORMAL HIGH (ref 8–23)
CO2: 22 mmol/L (ref 22–32)
Calcium: 9.7 mg/dL (ref 8.9–10.3)
Chloride: 103 mmol/L (ref 98–111)
Creatinine: 1.37 mg/dL — ABNORMAL HIGH (ref 0.61–1.24)
GFR, Estimated: 55 mL/min — ABNORMAL LOW (ref >60)
Glucose, Bld: 220 mg/dL — ABNORMAL HIGH (ref 70–99)
Potassium: 3.5 mmol/L (ref 3.5–5.1)
Sodium: 138 mmol/L (ref 135–145)
Total Bilirubin: 0.4 mg/dL (ref 0.3–1.2)
Total Protein: 7.6 g/dL (ref 6.5–8.1)

## 2022-11-20 LAB — TSH: TSH: 4.321 u[IU]/mL (ref 0.350–4.500)

## 2022-11-20 MED ORDER — SODIUM CHLORIDE 0.9 % IV SOLN
1200.0000 mg | Freq: Once | INTRAVENOUS | Status: AC
  Administered 2022-11-20: 1200 mg via INTRAVENOUS
  Filled 2022-11-20: qty 20

## 2022-11-20 MED ORDER — SODIUM CHLORIDE 0.9 % IV SOLN: Freq: Once | INTRAVENOUS | Status: AC

## 2022-11-20 NOTE — Progress Notes (Signed)
[facility] [phone]   Fax:([phone]) [place], MD [place] [address]  DIAGNOSIS: Stage IIIA (T1c, N2, M0) non-small cell lung cancer, moderately differentiated adenocarcinoma diagnosed in December 2022.  PD-L1 expression is 70%.    Molecular study showed no actionable mutation.  It has only MAP2K1  PRIOR THERAPY:  1) Status post robotic assisted left video thoracoscopy with emergency thoracotomy and left upper lobectomy and mediastinal lymph node sampling under the care of Dr. Kipp Brood on November 15, 2021. 2) Adjuvant systemic chemotherapy with carboplatin for AUC of 6 and paclitaxel 200 Mg/M2 with Neulasta support every 3 weeks.  First dose January 08, 2022.  The patient was not eligible for treatment with cisplatin and Alimta because of renal insufficiency.  Status post 4 cycles.  Last dose was given on March 11, 2022.  CURRENT THERAPY: Adjuvant treatment with immunotherapy with atezolizumab 1200 Mg IV every 3 weeks.  First dose 04/23/2022.  Status post 10 cycles.  INTERVAL HISTORY: [name] 71 y.o. male returns to the clinic today for follow-up visit.  The patient is feeling fine today with no concerning complaints except for intermittent left-sided chest wall pain from the surgical scar.  The patient has no cough, shortness of breath or hemoptysis.  He has no nausea, vomiting, diarrhea or constipation.  He has no headache or visual changes.  He continues to tolerate his adjuvant treatment with atezolizumab fairly well.  He is here today for evaluation before starting cycle #11.  MEDICAL HISTORY: Past Medical History:  Diagnosis Date   Diabetes mellitus ([place])    Dyslipidemia    Hypertension     ALLERGIES:  has No Known Allergies.  MEDICATIONS:  Current Outpatient Medications  Medication Sig Dispense Refill   atenolol (TENORMIN) 100 MG tablet Take 100 mg by mouth daily.      atorvastatin (LIPITOR) 20 MG tablet Take 20 mg by mouth daily.     famotidine (PEPCID) 40 MG tablet Take 40 mg by mouth daily.     gabapentin (NEURONTIN) 300 MG capsule Take 1 capsule (300 mg total) by mouth 3 (three) times daily. 90 capsule 0   losartan-hydrochlorothiazide (HYZAAR) 100-12.5 MG tablet Take 1 tablet by mouth daily.     metFORMIN (GLUCOPHAGE-XR) 500 MG 24 hr tablet Take 1,000 mg by mouth every evening.     methocarbamol (ROBAXIN) 500 MG tablet TAKE ONE TABLET BY MOUTH THREE TIMES A DAY 90 tablet 0   triamcinolone cream (KENALOG) 0.1 % Apply 1 Application topically 2 (two) times daily. 30 g 2   No current facility-administered medications for this visit.    SURGICAL HISTORY:  Past Surgical History:  Procedure Laterality Date   BRONCHIAL BIOPSY  11/15/2021   Procedure: BRONCHIAL BIOPSIES;  Surgeon: Garner Nash, DO;  Location: [facility];  Service: Pulmonary;;   BRONCHIAL BRUSHINGS  11/15/2021   Procedure: BRONCHIAL BRUSHINGS;  Surgeon: Garner Nash, DO;  Location: [facility];  Service: Pulmonary;;   FIDUCIAL MARKER PLACEMENT  11/15/2021   Procedure: FIDUCIAL DYE MARKING;  Surgeon: Garner Nash, DO;  Location: [facility];  Service: Pulmonary;;   INGUINAL HERNIA REPAIR     INTERCOSTAL NERVE BLOCK Left 11/15/2021   Procedure: INTERCOSTAL NERVE BLOCK;  Surgeon: Lajuana Matte, MD;  Location: [facility];  Service: Thoracic;  Laterality: Left;   LOBECTOMY Left 11/15/2021   Procedure: LEFT UPPER LOBECTOMY;  Surgeon: Lajuana Matte, MD;  Location: [facility];  Service: Thoracic;  Laterality: Left;   MEDIASTINAL EXPLORATION Left 11/15/2021   Procedure: MEDIASTINAL LYMPH NODE EXPLORATION;  Surgeon: Lajuana Matte, MD;  Location: [facility];  Service: Thoracic;  Laterality: Left;   NODE DISSECTION Left 11/15/2021   Procedure: NODE DISSECTION;  Surgeon: Lajuana Matte, MD;  Location: [facility];  Service: Thoracic;  Laterality: Left;   RIB PLATING Left 11/15/2021    Procedure: RIB PLATING;  Surgeon: Lajuana Matte, MD;  Location: [facility];  Service: Thoracic;  Laterality: Left;   THORACOTOMY/LOBECTOMY Left 11/15/2021   Procedure: EMERGENCY THORACOTOMY;  Surgeon: Lajuana Matte, MD;  Location: [facility];  Service: Thoracic;  Laterality: Left;   VIDEO BRONCHOSCOPY WITH RADIAL ENDOBRONCHIAL ULTRASOUND  11/15/2021   Procedure: VIDEO BRONCHOSCOPY WITH RADIAL ENDOBRONCHIAL ULTRASOUND;  Surgeon: Garner Nash, DO;  Location: [facility];  Service: Pulmonary;;    REVIEW OF SYSTEMS:  A comprehensive review of systems was negative.   PHYSICAL EXAMINATION: General appearance: alert, cooperative, and no distress Head: Normocephalic, without obvious abnormality, atraumatic Neck: no adenopathy, no JVD, supple, symmetrical, trachea midline, and thyroid not enlarged, symmetric, no tenderness/mass/nodules Lymph nodes: Cervical, supraclavicular, and axillary nodes normal. Resp: clear to auscultation bilaterally Back: symmetric, no curvature. ROM normal. No CVA tenderness. Cardio: regular rate and rhythm, S1, S2 normal, no murmur, click, rub or gallop GI: soft, non-tender; bowel sounds normal; no masses,  no organomegaly Extremities: extremities normal, atraumatic, no cyanosis or edema  ECOG PERFORMANCE STATUS: 1 - Symptomatic but completely ambulatory  Blood pressure (!) 157/93, pulse 82, temperature 97.6 F (36.4 C), temperature source Oral, resp. rate 15, weight 220 lb 14.4 oz (100.2 kg), SpO2 98 %.  LABORATORY DATA: Lab Results  Component Value Date   WBC 7.7 11/20/2022   HGB 12.4 (L) 11/20/2022   HCT 37.5 (L) 11/20/2022   MCV 83.9 11/20/2022   PLT 202 11/20/2022      Chemistry      Component Value Date/Time   NA 137 10/30/2022 0750   K 3.8 10/30/2022 0750   CL 105 10/30/2022 0750   CO2 24 10/30/2022 0750   BUN 35 (H) 10/30/2022 0750   CREATININE 1.34 (H) 10/30/2022 0750      Component Value Date/Time   CALCIUM 8.9 10/30/2022 0750   ALKPHOS  73 10/30/2022 0750   AST 22 10/30/2022 0750   ALT 19 10/30/2022 0750   BILITOT 0.4 10/30/2022 0750       RADIOGRAPHIC STUDIES: No results found.  ASSESSMENT AND PLAN: This is a very pleasant 71 years old white male diagnosed with a stage IIIa (T1c, N2, M0) non-small cell lung cancer, adenocarcinoma presented with left upper lobe lung nodule in addition to mediastinal lymphadenopathy diagnosed in December 2022.  The patient is status post left upper lobectomy with lymph node dissection.  The resection margins were negative for malignancy and there was no visceral pleural involvement but lymphovascular invasion. The patient has no actionable mutations and PD-L1 expression was 70% He completed 4 cycles of adjuvant systemic chemotherapy with carboplatin and paclitaxel.  He was not a good candidate for treatment with Alimta because of renal insufficiency. The patient is currently undergoing adjuvant treatment with immunotherapy with atezolizumab 1200 Mg IV every 3 weeks status post 10 cycles.   The patient has been tolerating this treatment well with no concerning adverse effects. I recommended for him to proceed with cycle #11 today as planned.  I will see him back for follow-up visit in 3  weeks for evaluation before starting cycle #12.  The patient was advised to call immediately if he has any other concerning symptoms in the interval. The patient voices understanding of current disease status and treatment options and is in agreement with the current care plan.  All questions were answered. The patient knows to call the clinic with any problems, questions or concerns. We can certainly see the patient much sooner if necessary.   Disclaimer: This note was dictated with voice recognition software. Similar sounding words can inadvertently be transcribed and may not be corrected upon review.

## 2022-11-20 NOTE — Patient Instructions (Signed)
Atezolizumab Injection What is this medication? ATEZOLIZUMAB (a te zoe LIZ ue mab) treats some types of cancer. It works by helping your immune system slow or stop the spread of cancer cells. It is a monoclonal antibody. This medicine may be used for other purposes; ask your health care provider or pharmacist if you have questions. COMMON BRAND NAME(S): Tecentriq What should I tell my care team before I take this medication? They need to know if you have any of these conditions: Allogeneic stem cell transplant (uses someone else's stem cells) Autoimmune diseases, such as Crohn disease, ulcerative colitis, lupus History of chest radiation Nervous system problems, such as Guillain-Barre syndrome, myasthenia gravis Organ transplant An unusual or allergic reaction to atezolizumab, other medications, foods, dyes, or preservatives Pregnant or trying to get pregnant Breast-feeding How should I use this medication? This medication is injected into a vein. It is given by your care team in a hospital or clinic setting. A special MedGuide will be given to you before each treatment. Be sure to read this information carefully each time. Talk to your care team about the use of this medication in children. While it may be prescribed for children as young as 2 years for selected conditions, precautions do apply. Overdosage: If you think you have taken too much of this medicine contact a poison control center or emergency room at once. NOTE: This medicine is only for you. Do not share this medicine with others. What if I miss a dose? Keep appointments for follow-up doses. It is important not to miss your dose. Call your care team if you are unable to keep an appointment. What may interact with this medication? Interactions have not been studied. This list may not describe all possible interactions. Give your health care provider a list of all the medicines, herbs, non-prescription drugs, or dietary  supplements you use. Also tell them if you smoke, drink alcohol, or use illegal drugs. Some items may interact with your medicine. What should I watch for while using this medication? Your condition will be monitored carefully while you are receiving this medication. You may need blood work while taking this medication. This medication may cause serious skin reactions. They can happen weeks to months after starting the medication. Contact your care team right away if you notice fevers or flu-like symptoms with a rash. The rash may be red or purple and then turn into blisters or peeling of the skin. You may also notice a red rash with swelling of the face, lips, or lymph nodes in your neck or under your arms. Tell your care team right away if you have any change in your eyesight. Talk to your care team if you may be pregnant. Serious birth defects can occur if you take this medication during pregnancy and for 5 months after the last dose. You will need a negative pregnancy test before starting this medication. Contraception is recommended while taking this medication and for 5 months after the last dose. Your care team can help you find the option that works for you. Do not breastfeed while taking this medication and for at least 5 months after the last dose. What side effects may I notice from receiving this medication? Side effects that you should report to your doctor or health care professional as soon as possible: Allergic reactions--skin rash, itching, hives, swelling of the face, lips, tongue, or throat Dry cough, shortness of breath or trouble breathing Eye pain, redness, irritation, or discharge with blurry or decreased  vision Heart muscle inflammation--unusual weakness or fatigue, shortness of breath, chest pain, fast or irregular heartbeat, dizziness, swelling of the ankles, feet, or hands Hormone gland problems--headache, sensitivity to light, unusual weakness or fatigue, dizziness, fast or  irregular heartbeat, increased sensitivity to cold or heat, excessive sweating, constipation, hair loss, increased thirst or amount of urine, tremors or shaking, irritability Infusion reactions--chest pain, shortness of breath or trouble breathing, feeling faint or lightheaded Kidney injury (glomerulonephritis)--decrease in the amount of urine, red or dark brown urine, foamy or bubbly urine, swelling of the ankles, hands, or feet Liver injury--right upper belly pain, loss of appetite, nausea, light-colored stool, dark yellow or brown urine, yellowing skin or eyes, unusual weakness or fatigue Pain, tingling, or numbness in the hands or feet, muscle weakness, change in vision, confusion or trouble speaking, loss of balance or coordination, trouble walking, seizures Rash, fever, and swollen lymph nodes Redness, blistering, peeling, or loosening of the skin, including inside the mouth Sudden or severe stomach pain, bloody diarrhea, fever, nausea, vomiting Side effects that usually do not require medical attention (report to your doctor or health care professional if they continue or are bothersome): Bone, joint, or muscle pain Diarrhea Fatigue Loss of appetite Nausea Skin rash This list may not describe all possible side effects. Call your doctor for medical advice about side effects. You may report side effects to FDA at 1-800-FDA-1088. Where should I keep my medication? This medication is given in a hospital or clinic. It will not be stored at home. NOTE: This sheet is a summary. It may not cover all possible information. If you have questions about this medicine, talk to your doctor, pharmacist, or health care provider.  2023 Elsevier/Gold Standard (2022-04-16 00:00:00)

## 2022-11-22 LAB — T4: T4, Total: 6.1 ug/dL (ref 4.5–12.0)

## 2022-11-25 ENCOUNTER — Other Ambulatory Visit: Payer: Self-pay

## 2022-11-27 ENCOUNTER — Other Ambulatory Visit: Payer: Self-pay | Admitting: Physician Assistant

## 2022-11-28 ENCOUNTER — Other Ambulatory Visit: Payer: Self-pay

## 2022-11-29 ENCOUNTER — Telehealth: Payer: Self-pay | Admitting: *Deleted

## 2022-11-29 NOTE — Telephone Encounter (Signed)
Patient contacted the office requesting a refill of his Gabapentin. Patient states he contacted his PCP regarding a refill and states his PCP would not refill it. Per Dr. Kipp Brood, medication needs to be managed by his PCP or oncologist. Attempted to contact patient. VM left.

## 2022-12-03 ENCOUNTER — Other Ambulatory Visit: Payer: Self-pay

## 2022-12-08 NOTE — Progress Notes (Unsigned)
[facility] OFFICE PROGRESS NOTE  Curt Bears, MD [address] [place] [address]  DIAGNOSIS: Stage IIIA (T1c, N2, M0) non-small cell lung cancer, moderately differentiated adenocarcinoma diagnosed in December 2022.   PD-L1 expression is 70%.   Molecular study showed no actionable mutation.  It has only MAP2K1   PRIOR THERAPY: 1) Status post robotic assisted left video thoracoscopy with emergency thoracotomy and left upper lobectomy and mediastinal lymph node sampling under the care of Dr. Kipp Brood on November 15, 2021. 2) Adjuvant systemic chemotherapy with carboplatin for AUC of 6 and paclitaxel 200 Mg/M2 with Neulasta support every 3 weeks.  First dose January 08, 2022.  The patient was not eligible for treatment with cisplatin and Alimta because of renal insufficiency.  Status post 4 cycles.  Last dose was given on March 11, 2022.  CURRENT THERAPY: Adjuvant treatment with immunotherapy with atezolizumab 1200 Mg IV every 3 weeks.  First dose 04/23/2022.  Status post 11 cycles.   INTERVAL HISTORY: [name] 71 y.o. male returns to the clinic today for a follow-up visit.  The patient is currently undergoing treatment with adjuvant immunotherapy with Tecentriq.  He is status post 9 cycles.  Has been tolerating it fairly well.  He denies any new concerning complaints today other than weight gain. He is going to try to rent a bike when the weather gets warmer. The patient has stable fatigue and decreased exercise tolerance. He denies any fever or chills. He has some left-sided chest discomfort over the surgical site that comes and goes for which he is prescribed Robaxin and gabapentin. He is requiring a refill. This was prescribed by Dr. Kipp Brood following surgery; however, the patient is only following with their clinic on an as needed basis and needs oncology or his PCP to refill this. He reports stable baseline dyspnea. Denies significant worsening  cough except he had some cough yesterday due to phlegm feeling stuck. This exacerbated his post surgical rib pain.  Denies hemoptysis.  Denies any nausea or vomiting.  Denies any diarrhea or significant constipation at this time. Occasionally, he may have constipation depending on what he eats.  He denies any headache or visual changes. He has some dry skin on his face for which he uses lotion. I previously gave him kenalog cream for his rash which has helped a lot.    He is here today for evaluation and repeat blood work before undergoing cycle #10.      MEDICAL HISTORY: Past Medical History:  Diagnosis Date   Diabetes mellitus ([place])    Dyslipidemia    Hypertension     ALLERGIES:  has No Known Allergies.  MEDICATIONS:  Current Outpatient Medications  Medication Sig Dispense Refill   atenolol (TENORMIN) 100 MG tablet Take 100 mg by mouth daily.     atorvastatin (LIPITOR) 20 MG tablet Take 20 mg by mouth daily.     famotidine (PEPCID) 40 MG tablet Take 40 mg by mouth daily.     gabapentin (NEURONTIN) 300 MG capsule Take 1 capsule (300 mg total) by mouth 3 (three) times daily. 270 capsule 0   losartan-hydrochlorothiazide (HYZAAR) 100-12.5 MG tablet Take 1 tablet by mouth daily.     metFORMIN (GLUCOPHAGE-XR) 500 MG 24 hr tablet Take 1,000 mg by mouth every evening.     methocarbamol (ROBAXIN) 500 MG tablet Take 1 tablet (500 mg total) by mouth 3 (three) times daily. 90 tablet 2   triamcinolone cream (KENALOG) 0.1 % Apply 1  Application topically 2 (two) times daily. 30 g 2   No current facility-administered medications for this visit.    SURGICAL HISTORY:  Past Surgical History:  Procedure Laterality Date   BRONCHIAL BIOPSY  11/15/2021   Procedure: BRONCHIAL BIOPSIES;  Surgeon: Garner Nash, DO;  Location: [facility];  Service: Pulmonary;;   BRONCHIAL BRUSHINGS  11/15/2021   Procedure: BRONCHIAL BRUSHINGS;  Surgeon: Garner Nash, DO;  Location: [facility];  Service:  Pulmonary;;   FIDUCIAL MARKER PLACEMENT  11/15/2021   Procedure: FIDUCIAL DYE MARKING;  Surgeon: Garner Nash, DO;  Location: [facility];  Service: Pulmonary;;   INGUINAL HERNIA REPAIR     INTERCOSTAL NERVE BLOCK Left 11/15/2021   Procedure: INTERCOSTAL NERVE BLOCK;  Surgeon: Lajuana Matte, MD;  Location: [facility];  Service: Thoracic;  Laterality: Left;   LOBECTOMY Left 11/15/2021   Procedure: LEFT UPPER LOBECTOMY;  Surgeon: Lajuana Matte, MD;  Location: [facility];  Service: Thoracic;  Laterality: Left;   MEDIASTINAL EXPLORATION Left 11/15/2021   Procedure: MEDIASTINAL LYMPH NODE EXPLORATION;  Surgeon: Lajuana Matte, MD;  Location: [facility];  Service: Thoracic;  Laterality: Left;   NODE DISSECTION Left 11/15/2021   Procedure: NODE DISSECTION;  Surgeon: Lajuana Matte, MD;  Location: [facility];  Service: Thoracic;  Laterality: Left;   RIB PLATING Left 11/15/2021   Procedure: RIB PLATING;  Surgeon: Lajuana Matte, MD;  Location: [facility];  Service: Thoracic;  Laterality: Left;   THORACOTOMY/LOBECTOMY Left 11/15/2021   Procedure: EMERGENCY THORACOTOMY;  Surgeon: Lajuana Matte, MD;  Location: [facility];  Service: Thoracic;  Laterality: Left;   VIDEO BRONCHOSCOPY WITH RADIAL ENDOBRONCHIAL ULTRASOUND  11/15/2021   Procedure: VIDEO BRONCHOSCOPY WITH RADIAL ENDOBRONCHIAL ULTRASOUND;  Surgeon: Garner Nash, DO;  Location: [facility];  Service: Pulmonary;;    REVIEW OF SYSTEMS:   Review of Systems  Constitutional: Negative for appetite change, chills, fever and unexpected weight change. fatigue HENT:   Negative for mouth sores, nosebleeds, sore throat and trouble swallowing.   Eyes: Negative for eye problems and icterus.  Respiratory: Negative for hemoptysis, and wheezing.  Stable Shortness of breath.  Cardiovascular: Negative for chest pain and leg swelling.  Gastrointestinal: Negative for abdominal pain, constipation, diarrhea, nausea and vomiting.  Genitourinary: Negative  for bladder incontinence, difficulty urinating, dysuria, frequency and hematuria.   Musculoskeletal: Negative for back pain, gait problem, neck pain and neck stiffness.  Skin: Improving rash.  Neurological: Negative for dizziness, extremity weakness, gait problem, headaches, light-headedness and seizures.  Hematological: Negative for adenopathy. Does not bruise/bleed easily.  Psychiatric/Behavioral: Negative for confusion, depression and sleep disturbance. The patient is not nervous/anxious.        PHYSICAL EXAMINATION:  Blood pressure (!) 140/82, pulse 62, temperature 98.2 F (36.8 C), temperature source Oral, resp. rate 18, height _0  (1.803 m), weight 224 lb 4.8 oz (101.7 kg), SpO2 95 %.  ECOG PERFORMANCE STATUS: 1  Physical Exam  Constitutional: Oriented to person, place, and time and well-developed, well-nourished, and in no distress. HENT:  Head: Normocephalic and atraumatic.  Mouth/Throat: Oropharynx is clear and moist. No oropharyngeal exudate.  Eyes: Conjunctivae are normal. Right eye exhibits no discharge. Left eye exhibits no discharge. No scleral icterus.  Neck: Normal range of motion. Neck supple.  Cardiovascular: Normal rate, regular rhythm, normal heart sounds and intact distal pulses.   Pulmonary/Chest: Effort normal and breath sounds normal. No respiratory distress. No wheezes. No rales.  Abdominal: Soft. Bowel sounds are normal. Exhibits no  distension and no mass. There is no tenderness.  Musculoskeletal: Normal range of motion. Exhibits no edema.  Lymphadenopathy:    No cervical adenopathy.  Neurological: Alert and oriented to person, place, and time. Exhibits normal muscle tone. Gait normal. Coordination normal.  Skin: Skin is warm and dry. Not diaphoretic. No erythema. No pallor.  Psychiatric: Mood, memory and judgment normal.  Vitals reviewed.    LABORATORY DATA: Lab Results  Component Value Date   WBC 8.1 12/11/2022   HGB 12.2 (L) 12/11/2022   HCT 36.2  (L) 12/11/2022   MCV 83.2 12/11/2022   PLT 201 12/11/2022      Chemistry      Component Value Date/Time   NA 136 12/11/2022 0815   K 3.4 (L) 12/11/2022 0815   CL 103 12/11/2022 0815   CO2 22 12/11/2022 0815   BUN 31 (H) 12/11/2022 0815   CREATININE 1.82 (H) 12/11/2022 0815      Component Value Date/Time   CALCIUM 9.2 12/11/2022 0815   ALKPHOS 66 12/11/2022 0815   AST 20 12/11/2022 0815   ALT 16 12/11/2022 0815   BILITOT 0.6 12/11/2022 0815       RADIOGRAPHIC STUDIES:  No results found.   ASSESSMENT/PLAN:  This is  a very pleasant 71 year old Caucasian male diagnosed with stage IIIa (T1c, N2, M0) non-small cell lung cancer, adenocarcinoma.  The patient presented with a left upper lobe lung nodule in addition to mediastinal lymphadenopathy.  He was diagnosed in December 2022.  The patient's PD-L1 expression is 70%.  He has no actionable mutations.  The patient underwent a left upper lobectomy with lymph node dissection under the care of Dr. Kipp Brood.  The resection margins were negative for malignancy and there was no visceral pleural involvement but there was lymphovascular invasion.   Therefore the patient underwent adjuvant systemic chemotherapy with 4 cycles of carboplatin for an AUC of 6 and paclitaxel 200 mg per metered squared IV every 3 weeks with Neulasta support.  The patient is not a good candidate for cisplatin Alimta because of his renal insufficiency.  He completed this on 03/11/2022.  He tolerated this fairly well except for Neulasta associated arthralgias.   Because of his PD-L1 expression being greater than 70%, Dr. Julien Nordmann recommended adjuvant treatment with immunotherapy with Tecentriq 1200 mg IV every 3 weeks.  The patient is status post  11 cycles.     Labs were reviewed. His creatinine is a little elevated today. Per chart review, his creatinine goes up and down. We will give him 1 L of fluid over 2 hours in the infusion room today. We will instruct him to  increase his hydration at home and avoid NSAIDs. Recommend that he proceed with cycle 12 today as scheduled.   We will see him back for follow-up visit in 3 weeks for evaluation and repeat blood work before starting cycle #13  I have refilled his gabapentin and robaxin.   The patient was advised to call immediately if he has any concerning symptoms in the interval. The patient voices understanding of current disease status and treatment options and is in agreement with the current care plan. All questions were answered. The patient knows to call the clinic with any problems, questions or concerns. We can certainly see the patient much sooner if necessary       No orders of the defined types were placed in this encounter.    The total time spent in the appointment was 20-29 minutes.   Katee Wentland  L Dakoda Laventure, PA-C 12/11/22

## 2022-12-11 ENCOUNTER — Inpatient Hospital Stay (HOSPITAL_BASED_OUTPATIENT_CLINIC_OR_DEPARTMENT_OTHER): Payer: Medicare Other | Admitting: Physician Assistant

## 2022-12-11 ENCOUNTER — Inpatient Hospital Stay: Payer: Medicare Other

## 2022-12-11 VITALS — BP 133/73 | HR 61 | Resp 18

## 2022-12-11 VITALS — BP 140/82 | HR 62 | Temp 98.2°F | Resp 18 | Ht 71.0 in | Wt 224.3 lb

## 2022-12-11 DIAGNOSIS — C3492 Malignant neoplasm of unspecified part of left bronchus or lung: Secondary | ICD-10-CM

## 2022-12-11 DIAGNOSIS — Z5112 Encounter for antineoplastic immunotherapy: Secondary | ICD-10-CM

## 2022-12-11 DIAGNOSIS — R7989 Other specified abnormal findings of blood chemistry: Secondary | ICD-10-CM | POA: Diagnosis not present

## 2022-12-11 LAB — CBC WITH DIFFERENTIAL (CANCER CENTER ONLY)
Abs Immature Granulocytes: 0.02 10*3/uL (ref 0.00–0.07)
Basophils Absolute: 0.1 10*3/uL (ref 0.0–0.1)
Basophils Relative: 1 %
Eosinophils Absolute: 0.3 10*3/uL (ref 0.0–0.5)
Eosinophils Relative: 4 %
HCT: 36.2 % — ABNORMAL LOW (ref 39.0–52.0)
Hemoglobin: 12.2 g/dL — ABNORMAL LOW (ref 13.0–17.0)
Immature Granulocytes: 0 %
Lymphocytes Relative: 40 %
Lymphs Abs: 3.2 10*3/uL (ref 0.7–4.0)
MCH: 28 pg (ref 26.0–34.0)
MCHC: 33.7 g/dL (ref 30.0–36.0)
MCV: 83.2 fL (ref 80.0–100.0)
Monocytes Absolute: 0.8 10*3/uL (ref 0.1–1.0)
Monocytes Relative: 10 %
Neutro Abs: 3.7 10*3/uL (ref 1.7–7.7)
Neutrophils Relative %: 45 %
Platelet Count: 201 10*3/uL (ref 150–400)
RBC: 4.35 MIL/uL (ref 4.22–5.81)
RDW: 15.2 % (ref 11.5–15.5)
WBC Count: 8.1 10*3/uL (ref 4.0–10.5)
nRBC: 0 % (ref 0.0–0.2)

## 2022-12-11 LAB — CMP (CANCER CENTER ONLY)
ALT: 16 U/L (ref 0–44)
AST: 20 U/L (ref 15–41)
Albumin: 4.1 g/dL (ref 3.5–5.0)
Alkaline Phosphatase: 66 U/L (ref 38–126)
Anion gap: 11 (ref 5–15)
BUN: 31 mg/dL — ABNORMAL HIGH (ref 8–23)
CO2: 22 mmol/L (ref 22–32)
Calcium: 9.2 mg/dL (ref 8.9–10.3)
Chloride: 103 mmol/L (ref 98–111)
Creatinine: 1.82 mg/dL — ABNORMAL HIGH (ref 0.61–1.24)
GFR, Estimated: 39 mL/min — ABNORMAL LOW (ref >60)
Glucose, Bld: 205 mg/dL — ABNORMAL HIGH (ref 70–99)
Potassium: 3.4 mmol/L — ABNORMAL LOW (ref 3.5–5.1)
Sodium: 136 mmol/L (ref 135–145)
Total Bilirubin: 0.6 mg/dL (ref 0.3–1.2)
Total Protein: 7.7 g/dL (ref 6.5–8.1)

## 2022-12-11 LAB — TSH: TSH: 3.545 u[IU]/mL (ref 0.350–4.500)

## 2022-12-11 MED ORDER — SODIUM CHLORIDE 0.9 % IV SOLN: Freq: Once | INTRAVENOUS | Status: AC

## 2022-12-11 MED ORDER — GABAPENTIN 300 MG PO CAPS: 300.0000 mg | ORAL_CAPSULE | Freq: Three times a day (TID) | ORAL | 0 refills | Status: DC

## 2022-12-11 MED ORDER — SODIUM CHLORIDE 0.9 % IV SOLN
1200.0000 mg | Freq: Once | INTRAVENOUS | Status: AC
  Administered 2022-12-11: 1200 mg via INTRAVENOUS
  Filled 2022-12-11: qty 20

## 2022-12-11 MED ORDER — METHOCARBAMOL 500 MG PO TABS: 500.0000 mg | ORAL_TABLET | Freq: Three times a day (TID) | ORAL | 2 refills | Status: DC

## 2022-12-11 NOTE — Progress Notes (Signed)
Per Cassie Heilingoetter, PA-C - okay to treat with elevated creatinine level of 1.82. Patient will receive 500 mL fluids with treatment today.  Patient advised to increase hydration and to avoid NSAIDs. Patient verbalized an understanding of the education.

## 2022-12-11 NOTE — Patient Instructions (Signed)
[facility]   Discharge Instructions: Thank you for choosing [facility] to provide your oncology and hematology care.   If you have a lab appointment with the [facility], please go directly to the [facility] and check in at the registration area.   Wear comfortable clothing and clothing appropriate for easy access to any Portacath or PICC line.   We strive to give you quality time with your provider. You may need to reschedule your appointment if you arrive late (15 or more minutes).  Arriving late affects you and other patients whose appointments are after yours.  Also, if you miss three or more appointments without notifying the office, you may be dismissed from the clinic at the provider's discretion.      For prescription refill requests, have your pharmacy contact our office and allow 72 hours for refills to be completed.    Today you received the following chemotherapy and/or immunotherapy agents: Atezolizumab (Tecentriq)      To help prevent nausea and vomiting after your treatment, we encourage you to take your nausea medication as directed.  BELOW ARE SYMPTOMS THAT SHOULD BE REPORTED IMMEDIATELY: *FEVER GREATER THAN 100.4 F (38 C) OR HIGHER *CHILLS OR SWEATING *NAUSEA AND VOMITING THAT IS NOT CONTROLLED WITH YOUR NAUSEA MEDICATION *UNUSUAL SHORTNESS OF BREATH *UNUSUAL BRUISING OR BLEEDING *URINARY PROBLEMS (pain or burning when urinating, or frequent urination) *BOWEL PROBLEMS (unusual diarrhea, constipation, pain near the anus) TENDERNESS IN MOUTH AND THROAT WITH OR WITHOUT PRESENCE OF ULCERS (sore throat, sores in mouth, or a toothache) UNUSUAL RASH, SWELLING OR PAIN  UNUSUAL VAGINAL DISCHARGE OR ITCHING   Items with * indicate a potential emergency and should be followed up as soon as possible or go to the Emergency Department if any problems should occur.  Please show the CHEMOTHERAPY ALERT CARD or IMMUNOTHERAPY ALERT  CARD at check-in to the Emergency Department and triage nurse.  Should you have questions after your visit or need to cancel or reschedule your appointment, please contact [facility]  Dept: [phone]  and follow the prompts.  Office hours are 8:00 a.m. to 4:30 p.m. Monday - Friday. Please note that voicemails left after 4:00 p.m. may not be returned until the following business day.  We are closed weekends and major holidays. You have access to a nurse at all times for urgent questions. Please call the main number to the clinic Dept: [phone] and follow the prompts.   For any non-urgent questions, you may also contact your provider using MyChart. We now offer e-Visits for anyone 73 and older to request care online for non-urgent symptoms. For details visit [email or web address].   Also download the MyChart app! Go to the app store, search "MyChart", open the app, select [facility], and log in with your MyChart username and password.

## 2022-12-12 LAB — T4: T4, Total: 5.9 ug/dL (ref 4.5–12.0)

## 2022-12-18 ENCOUNTER — Other Ambulatory Visit: Payer: Self-pay

## 2023-01-01 ENCOUNTER — Inpatient Hospital Stay (HOSPITAL_BASED_OUTPATIENT_CLINIC_OR_DEPARTMENT_OTHER): Payer: Medicare Other | Admitting: Internal Medicine

## 2023-01-01 ENCOUNTER — Inpatient Hospital Stay: Payer: Medicare Other

## 2023-01-01 ENCOUNTER — Inpatient Hospital Stay: Payer: Medicare Other | Attending: Internal Medicine

## 2023-01-01 VITALS — BP 137/78 | HR 63 | Resp 18 | Wt 222.3 lb

## 2023-01-01 DIAGNOSIS — Z79899 Other long term (current) drug therapy: Secondary | ICD-10-CM | POA: Insufficient documentation

## 2023-01-01 DIAGNOSIS — C3492 Malignant neoplasm of unspecified part of left bronchus or lung: Secondary | ICD-10-CM | POA: Diagnosis not present

## 2023-01-01 DIAGNOSIS — N289 Disorder of kidney and ureter, unspecified: Secondary | ICD-10-CM | POA: Diagnosis not present

## 2023-01-01 DIAGNOSIS — I10 Essential (primary) hypertension: Secondary | ICD-10-CM | POA: Diagnosis not present

## 2023-01-01 DIAGNOSIS — E119 Type 2 diabetes mellitus without complications: Secondary | ICD-10-CM | POA: Diagnosis not present

## 2023-01-01 DIAGNOSIS — C3412 Malignant neoplasm of upper lobe, left bronchus or lung: Secondary | ICD-10-CM | POA: Diagnosis present

## 2023-01-01 DIAGNOSIS — Z5112 Encounter for antineoplastic immunotherapy: Secondary | ICD-10-CM | POA: Insufficient documentation

## 2023-01-01 DIAGNOSIS — Z902 Acquired absence of lung [part of]: Secondary | ICD-10-CM | POA: Diagnosis not present

## 2023-01-01 DIAGNOSIS — C3490 Malignant neoplasm of unspecified part of unspecified bronchus or lung: Secondary | ICD-10-CM | POA: Diagnosis not present

## 2023-01-01 LAB — CBC WITH DIFFERENTIAL (CANCER CENTER ONLY)
Abs Immature Granulocytes: 0.03 10*3/uL (ref 0.00–0.07)
Basophils Absolute: 0.1 10*3/uL (ref 0.0–0.1)
Basophils Relative: 1 %
Eosinophils Absolute: 0.3 10*3/uL (ref 0.0–0.5)
Eosinophils Relative: 3 %
HCT: 37.5 % — ABNORMAL LOW (ref 39.0–52.0)
Hemoglobin: 12.5 g/dL — ABNORMAL LOW (ref 13.0–17.0)
Immature Granulocytes: 0 %
Lymphocytes Relative: 38 %
Lymphs Abs: 3.2 10*3/uL (ref 0.7–4.0)
MCH: 27.9 pg (ref 26.0–34.0)
MCHC: 33.3 g/dL (ref 30.0–36.0)
MCV: 83.7 fL (ref 80.0–100.0)
Monocytes Absolute: 0.9 10*3/uL (ref 0.1–1.0)
Monocytes Relative: 10 %
Neutro Abs: 3.9 10*3/uL (ref 1.7–7.7)
Neutrophils Relative %: 48 %
Platelet Count: 210 10*3/uL (ref 150–400)
RBC: 4.48 MIL/uL (ref 4.22–5.81)
RDW: 15.4 % (ref 11.5–15.5)
WBC Count: 8.4 10*3/uL (ref 4.0–10.5)
nRBC: 0 % (ref 0.0–0.2)

## 2023-01-01 LAB — CMP (CANCER CENTER ONLY)
ALT: 16 U/L (ref 0–44)
AST: 18 U/L (ref 15–41)
Albumin: 3.9 g/dL (ref 3.5–5.0)
Alkaline Phosphatase: 65 U/L (ref 38–126)
Anion gap: 11 (ref 5–15)
BUN: 27 mg/dL — ABNORMAL HIGH (ref 8–23)
CO2: 20 mmol/L — ABNORMAL LOW (ref 22–32)
Calcium: 9 mg/dL (ref 8.9–10.3)
Chloride: 105 mmol/L (ref 98–111)
Creatinine: 1.81 mg/dL — ABNORMAL HIGH (ref 0.61–1.24)
GFR, Estimated: 39 mL/min — ABNORMAL LOW (ref >60)
Glucose, Bld: 176 mg/dL — ABNORMAL HIGH (ref 70–99)
Potassium: 3.7 mmol/L (ref 3.5–5.1)
Sodium: 136 mmol/L (ref 135–145)
Total Bilirubin: 0.5 mg/dL (ref 0.3–1.2)
Total Protein: 7.1 g/dL (ref 6.5–8.1)

## 2023-01-01 LAB — TSH: TSH: 2.235 u[IU]/mL (ref 0.350–4.500)

## 2023-01-01 MED ORDER — SODIUM CHLORIDE 0.9 % IV SOLN
1200.0000 mg | Freq: Once | INTRAVENOUS | Status: AC
  Administered 2023-01-01: 1200 mg via INTRAVENOUS
  Filled 2023-01-01: qty 20

## 2023-01-01 MED ORDER — SODIUM CHLORIDE 0.9 % IV SOLN: Freq: Once | INTRAVENOUS | Status: AC

## 2023-01-01 NOTE — Progress Notes (Signed)
Per Dr. Arbutus Ped, ok to treat with elevated creatinine.

## 2023-01-01 NOTE — Patient Instructions (Signed)
[facility]  Discharge Instructions: Thank you for choosing [facility] to provide your oncology and hematology care.   If you have a lab appointment with the Cancer Center, please go directly to the Cancer Center and check in at the registration area.   Wear comfortable clothing and clothing appropriate for easy access to any Portacath or PICC line.   We strive to give you quality time with your provider. You may need to reschedule your appointment if you arrive late (15 or more minutes).  Arriving late affects you and other patients whose appointments are after yours.  Also, if you miss three or more appointments without notifying the office, you may be dismissed from the clinic at the provider's discretion.      For prescription refill requests, have your pharmacy contact our office and allow 72 hours for refills to be completed.    Today you received the following chemotherapy and/or immunotherapy agents tecentriq      To help prevent nausea and vomiting after your treatment, we encourage you to take your nausea medication as directed.  BELOW ARE SYMPTOMS THAT SHOULD BE REPORTED IMMEDIATELY: *FEVER GREATER THAN 100.4 F (38 C) OR HIGHER *CHILLS OR SWEATING *NAUSEA AND VOMITING THAT IS NOT CONTROLLED WITH YOUR NAUSEA MEDICATION *UNUSUAL SHORTNESS OF BREATH *UNUSUAL BRUISING OR BLEEDING *URINARY PROBLEMS (pain or burning when urinating, or frequent urination) *BOWEL PROBLEMS (unusual diarrhea, constipation, pain near the anus) TENDERNESS IN MOUTH AND THROAT WITH OR WITHOUT PRESENCE OF ULCERS (sore throat, sores in mouth, or a toothache) UNUSUAL RASH, SWELLING OR PAIN  UNUSUAL VAGINAL DISCHARGE OR ITCHING   Items with * indicate a potential emergency and should be followed up as soon as possible or go to the Emergency Department if any problems should occur.  Please show the CHEMOTHERAPY ALERT CARD or IMMUNOTHERAPY ALERT CARD at check-in to  the Emergency Department and triage nurse.  Should you have questions after your visit or need to cancel or reschedule your appointment, please contact [facility]  Dept: [phone]  and follow the prompts.  Office hours are 8:00 a.m. to 4:30 p.m. Monday - Friday. Please note that voicemails left after 4:00 p.m. may not be returned until the following business day.  We are closed weekends and major holidays. You have access to a nurse at all times for urgent questions. Please call the main number to the clinic Dept: [phone] and follow the prompts.   For any non-urgent questions, you may also contact your provider using MyChart. We now offer e-Visits for anyone 27 and older to request care online for non-urgent symptoms. For details visit [email or web address].   Also download the MyChart app! Go to the app store, search "MyChart", open the app, select [facility], and log in with your MyChart username and password.

## 2023-01-01 NOTE — Progress Notes (Signed)
[facility] [phone]   Fax:([phone]  OFFICE PROGRESS NOTE  Si Gaul, MD [address] [place] [address]  DIAGNOSIS: Stage IIIA (T1c, N2, M0) non-small cell lung cancer, moderately differentiated adenocarcinoma diagnosed in December 2022.  PD-L1 expression is 70%.    Molecular study showed no actionable mutation.  It has only MAP2K1  PRIOR THERAPY:  1) Status post robotic assisted left video thoracoscopy with emergency thoracotomy and left upper lobectomy and mediastinal lymph node sampling under the care of Dr. Cliffton Asters on November 15, 2021. 2) Adjuvant systemic chemotherapy with carboplatin for AUC of 6 and paclitaxel 200 Mg/M2 with Neulasta support every 3 weeks.  First dose January 08, 2022.  The patient was not eligible for treatment with cisplatin and Alimta because of renal insufficiency.  Status post 4 cycles.  Last dose was given on March 11, 2022.  CURRENT THERAPY: Adjuvant treatment with immunotherapy with atezolizumab 1200 Mg IV every 3 weeks.  First dose 04/23/2022.  Status post 12 cycles.  INTERVAL HISTORY: [name] 72 y.o. male returns to the clinic today for follow-up visit.  The patient is feeling fine today with no concerning complaints.  He has been tolerating his treatment fairly well except for mild fatigue.  He has no nausea, vomiting, diarrhea or constipation.  He has no headache or visual changes.  He denied having any significant weight loss or night sweats.  He is here today for evaluation before starting cycle #13.  MEDICAL HISTORY: Past Medical History:  Diagnosis Date   Diabetes mellitus (HCC)    Dyslipidemia    Hypertension     ALLERGIES:  has No Known Allergies.  MEDICATIONS:  Current Outpatient Medications  Medication Sig Dispense Refill   atenolol (TENORMIN) 100 MG tablet Take 100 mg by mouth daily.     atorvastatin (LIPITOR) 20 MG tablet Take 20 mg by mouth daily.      famotidine (PEPCID) 40 MG tablet Take 40 mg by mouth daily.     gabapentin (NEURONTIN) 300 MG capsule Take 1 capsule (300 mg total) by mouth 3 (three) times daily. 270 capsule 0   losartan-hydrochlorothiazide (HYZAAR) 100-12.5 MG tablet Take 1 tablet by mouth daily.     metFORMIN (GLUCOPHAGE-XR) 500 MG 24 hr tablet Take 1,000 mg by mouth every evening.     methocarbamol (ROBAXIN) 500 MG tablet Take 1 tablet (500 mg total) by mouth 3 (three) times daily. 90 tablet 2   triamcinolone cream (KENALOG) 0.1 % Apply 1 Application topically 2 (two) times daily. 30 g 2   No current facility-administered medications for this visit.    SURGICAL HISTORY:  Past Surgical History:  Procedure Laterality Date   BRONCHIAL BIOPSY  11/15/2021   Procedure: BRONCHIAL BIOPSIES;  Surgeon: Josephine Igo, DO;  [facility]: [facility];  Service: Pulmonary;;   BRONCHIAL BRUSHINGS  11/15/2021   Procedure: BRONCHIAL BRUSHINGS;  Surgeon: Josephine Igo, DO;  [facility]: [facility];  Service: Pulmonary;;   FIDUCIAL MARKER PLACEMENT  11/15/2021   Procedure: FIDUCIAL DYE MARKING;  Surgeon: Josephine Igo, DO;  Location: [facility];  Service: Pulmonary;;   INGUINAL HERNIA REPAIR     INTERCOSTAL NERVE BLOCK Left 11/15/2021   Procedure: INTERCOSTAL NERVE BLOCK;  Surgeon: Corliss Skains, MD;  Location: [facility];  Service: Thoracic;  Laterality: Left;   LOBECTOMY Left 11/15/2021   Procedure: LEFT UPPER LOBECTOMY;  Surgeon: Corliss Skains, MD;  Location: [facility];  Service: Thoracic;  Laterality: Left;   MEDIASTINAL EXPLORATION Left 11/15/2021   Procedure: MEDIASTINAL LYMPH NODE EXPLORATION;  Surgeon: Corliss Skains, MD;  [facility];  Service: Thoracic;  Laterality: Left;   NODE DISSECTION Left 11/15/2021   Procedure: NODE DISSECTION;  Surgeon: Corliss Skains, MD;  [facility];  Service: Thoracic;  Laterality: Left;   RIB PLATING Left 11/15/2021   Procedure: RIB PLATING;  Surgeon: Corliss Skains, MD;  [facility];  Service: Thoracic;  Laterality: Left;   THORACOTOMY/LOBECTOMY Left 11/15/2021   Procedure: EMERGENCY THORACOTOMY;  Surgeon: Corliss Skains, MD;  [facility]: [facility];  Service: Thoracic;  Laterality: Left;   VIDEO BRONCHOSCOPY WITH RADIAL ENDOBRONCHIAL ULTRASOUND  11/15/2021   Procedure: VIDEO BRONCHOSCOPY WITH RADIAL ENDOBRONCHIAL ULTRASOUND;  Surgeon: Josephine Igo, DO;  [facility];  Service: Pulmonary;;    REVIEW OF SYSTEMS:  A comprehensive review of systems was negative except for: Constitutional: positive for fatigue   PHYSICAL EXAMINATION: General appearance: alert, cooperative, and no distress Head: Normocephalic, without obvious abnormality, atraumatic Neck: no adenopathy, no JVD, supple, symmetrical, trachea midline, and thyroid not enlarged, symmetric, no tenderness/mass/nodules Lymph nodes: Cervical, supraclavicular, and axillary nodes normal. Resp: clear to auscultation bilaterally Back: symmetric, no curvature. ROM normal. No CVA tenderness. Cardio: regular rate and rhythm, S1, S2 normal, no murmur, click, rub or gallop GI: soft, non-tender; bowel sounds normal; no masses,  no organomegaly Extremities: extremities normal, atraumatic, no cyanosis or edema  ECOG PERFORMANCE STATUS: 1 - Symptomatic but completely ambulatory  Blood pressure 137/78, pulse 63, resp. rate 18, weight 222 lb 5 oz (100.8 kg), SpO2 97 %.  LABORATORY DATA: Lab Results  Component Value Date   WBC 8.4 01/01/2023   HGB 12.5 (L) 01/01/2023   HCT 37.5 (L) 01/01/2023   MCV 83.7 01/01/2023   PLT 210 01/01/2023      Chemistry      Component Value Date/Time   NA 136 12/11/2022 0815   K 3.4 (L) 12/11/2022 0815   CL 103 12/11/2022 0815   CO2 22 12/11/2022 0815   BUN 31 (H) 12/11/2022 0815   CREATININE 1.82 (H) 12/11/2022 0815      Component Value Date/Time   CALCIUM 9.2 12/11/2022 0815   ALKPHOS 66 12/11/2022 0815   AST 20 12/11/2022 0815   ALT 16  12/11/2022 0815   BILITOT 0.6 12/11/2022 0815       RADIOGRAPHIC STUDIES: No results found.  ASSESSMENT AND PLAN: This is a very pleasant 72 years old white male diagnosed with a stage IIIa (T1c, N2, M0) non-small cell lung cancer, adenocarcinoma presented with left upper lobe lung nodule in addition to mediastinal lymphadenopathy diagnosed in December 2022.  The patient is status post left upper lobectomy with lymph node dissection.  The resection margins were negative for malignancy and there was no visceral pleural involvement but lymphovascular invasion. The patient has no actionable mutations and PD-L1 expression was 70% He completed 4 cycles of adjuvant systemic chemotherapy with carboplatin and paclitaxel.  He was not a good candidate for treatment with Alimta because of renal insufficiency. The patient is currently undergoing adjuvant treatment with immunotherapy with atezolizumab 1200 Mg IV every 3 weeks status post 12 cycles.   The patient has been tolerating this treatment well with no concerning adverse effects. I recommended for him to proceed with cycle #13 today as planned. I will see him back for follow-up visit in 3 weeks for evaluation with repeat CT scan of the chest  for restaging of his disease before starting cycle #14. The patient was advised to call immediately if he has any other concerning symptoms in the interval. The patient voices understanding of current disease status and treatment options and is in agreement with the current care plan.  All questions were answered. The patient knows to call the clinic with any problems, questions or concerns. We can certainly see the patient much sooner if necessary.   Disclaimer: This note was dictated with voice recognition software. Similar sounding words can inadvertently be transcribed and may not be corrected upon review.

## 2023-01-02 LAB — T4: T4, Total: 6 ug/dL (ref 4.5–12.0)

## 2023-01-03 ENCOUNTER — Other Ambulatory Visit: Payer: Self-pay

## 2023-01-05 ENCOUNTER — Other Ambulatory Visit: Payer: Self-pay

## 2023-01-08 ENCOUNTER — Other Ambulatory Visit: Payer: Self-pay

## 2023-01-09 ENCOUNTER — Other Ambulatory Visit: Payer: Self-pay

## 2023-01-20 ENCOUNTER — Ambulatory Visit (HOSPITAL_COMMUNITY)
Admission: RE | Admit: 2023-01-20 | Discharge: 2023-01-20 | Disposition: A | Discharge status: Home / Self Care | Payer: Medicare Other | Source: Ambulatory Visit | Attending: Internal Medicine | Admitting: Internal Medicine

## 2023-01-20 DIAGNOSIS — C3490 Malignant neoplasm of unspecified part of unspecified bronchus or lung: Secondary | ICD-10-CM | POA: Insufficient documentation

## 2023-01-20 MED ORDER — SODIUM CHLORIDE (PF) 0.9 % IJ SOLN
INTRAMUSCULAR | Status: AC
  Filled 2023-01-20: qty 50

## 2023-01-20 MED ORDER — IOHEXOL 300 MG/ML  SOLN
100.0000 mL | Freq: Once | INTRAMUSCULAR | Status: AC | PRN
  Administered 2023-01-20: 100 mL via INTRAVENOUS

## 2023-01-22 ENCOUNTER — Inpatient Hospital Stay (HOSPITAL_BASED_OUTPATIENT_CLINIC_OR_DEPARTMENT_OTHER): Payer: Medicare Other | Admitting: Internal Medicine

## 2023-01-22 ENCOUNTER — Inpatient Hospital Stay: Payer: Medicare Other | Attending: Internal Medicine

## 2023-01-22 ENCOUNTER — Encounter: Payer: Self-pay | Admitting: Internal Medicine

## 2023-01-22 ENCOUNTER — Inpatient Hospital Stay: Payer: Medicare Other

## 2023-01-22 VITALS — BP 167/84 | HR 62 | Resp 18

## 2023-01-22 VITALS — BP 144/89 | HR 64 | Temp 97.9°F | Resp 16 | Wt 222.4 lb

## 2023-01-22 DIAGNOSIS — I10 Essential (primary) hypertension: Secondary | ICD-10-CM | POA: Insufficient documentation

## 2023-01-22 DIAGNOSIS — Z5112 Encounter for antineoplastic immunotherapy: Secondary | ICD-10-CM | POA: Diagnosis present

## 2023-01-22 DIAGNOSIS — C3492 Malignant neoplasm of unspecified part of left bronchus or lung: Secondary | ICD-10-CM

## 2023-01-22 DIAGNOSIS — E119 Type 2 diabetes mellitus without complications: Secondary | ICD-10-CM | POA: Diagnosis not present

## 2023-01-22 DIAGNOSIS — C3412 Malignant neoplasm of upper lobe, left bronchus or lung: Secondary | ICD-10-CM | POA: Insufficient documentation

## 2023-01-22 DIAGNOSIS — Z79899 Other long term (current) drug therapy: Secondary | ICD-10-CM | POA: Diagnosis not present

## 2023-01-22 LAB — CMP (CANCER CENTER ONLY)
ALT: 25 U/L (ref 0–44)
AST: 29 U/L (ref 15–41)
Albumin: 4.2 g/dL (ref 3.5–5.0)
Alkaline Phosphatase: 69 U/L (ref 38–126)
Anion gap: 12 (ref 5–15)
BUN: 20 mg/dL (ref 8–23)
CO2: 22 mmol/L (ref 22–32)
Calcium: 9.4 mg/dL (ref 8.9–10.3)
Chloride: 103 mmol/L (ref 98–111)
Creatinine: 1.28 mg/dL — ABNORMAL HIGH (ref 0.61–1.24)
GFR, Estimated: 60 mL/min — ABNORMAL LOW (ref >60)
Glucose, Bld: 161 mg/dL — ABNORMAL HIGH (ref 70–99)
Potassium: 3.4 mmol/L — ABNORMAL LOW (ref 3.5–5.1)
Sodium: 137 mmol/L (ref 135–145)
Total Bilirubin: 0.6 mg/dL (ref 0.3–1.2)
Total Protein: 7.6 g/dL (ref 6.5–8.1)

## 2023-01-22 LAB — CBC WITH DIFFERENTIAL (CANCER CENTER ONLY)
Abs Immature Granulocytes: 0.02 10*3/uL (ref 0.00–0.07)
Basophils Absolute: 0.1 10*3/uL (ref 0.0–0.1)
Basophils Relative: 1 %
Eosinophils Absolute: 0.3 10*3/uL (ref 0.0–0.5)
Eosinophils Relative: 3 %
HCT: 39.2 % (ref 39.0–52.0)
Hemoglobin: 13.3 g/dL (ref 13.0–17.0)
Immature Granulocytes: 0 %
Lymphocytes Relative: 41 %
Lymphs Abs: 3.6 10*3/uL (ref 0.7–4.0)
MCH: 28.3 pg (ref 26.0–34.0)
MCHC: 33.9 g/dL (ref 30.0–36.0)
MCV: 83.4 fL (ref 80.0–100.0)
Monocytes Absolute: 0.9 10*3/uL (ref 0.1–1.0)
Monocytes Relative: 10 %
Neutro Abs: 4 10*3/uL (ref 1.7–7.7)
Neutrophils Relative %: 45 %
Platelet Count: 215 10*3/uL (ref 150–400)
RBC: 4.7 MIL/uL (ref 4.22–5.81)
RDW: 15.1 % (ref 11.5–15.5)
WBC Count: 8.8 10*3/uL (ref 4.0–10.5)
nRBC: 0 % (ref 0.0–0.2)

## 2023-01-22 LAB — TSH: TSH: 3.696 u[IU]/mL (ref 0.350–4.500)

## 2023-01-22 MED ORDER — SODIUM CHLORIDE 0.9 % IV SOLN: Freq: Once | INTRAVENOUS | Status: AC

## 2023-01-22 MED ORDER — SODIUM CHLORIDE 0.9 % IV SOLN
1200.0000 mg | Freq: Once | INTRAVENOUS | Status: AC
  Administered 2023-01-22: 1200 mg via INTRAVENOUS
  Filled 2023-01-22: qty 20

## 2023-01-22 NOTE — Progress Notes (Signed)
[facility] [phone]   Fax:([phone]) [place], [place] [phone]  DIAGNOSIS: Stage IIIA (T1c, N2, M0) non-small cell lung cancer, moderately differentiated adenocarcinoma diagnosed in December 2022.  PD-L1 expression is 70%.    Molecular study showed no actionable mutation.  It has only MAP2K1  PRIOR THERAPY:  1) Status post robotic assisted left video thoracoscopy with emergency thoracotomy and left upper lobectomy and mediastinal lymph node sampling under the care of Dr. Kipp Brood on November 15, 2021. 2) Adjuvant systemic chemotherapy with carboplatin for AUC of 6 and paclitaxel 200 Mg/M2 with Neulasta support every 3 weeks.  First dose January 08, 2022.  The patient was not eligible for treatment with cisplatin and Alimta because of renal insufficiency.  Status post 4 cycles.  Last dose was given on March 11, 2022.  CURRENT THERAPY: Adjuvant treatment with immunotherapy with atezolizumab 1200 Mg IV every 3 weeks.  First dose 04/23/2022.  Status post 13 cycles.  INTERVAL HISTORY: [name] 72 y.o. male returns to the clinic today for follow-up visit.  The patient is feeling fine today with no concerning complaints.  He has been tolerating his treatment with atezolizumab fairly well.  He denied having any current chest pain, shortness of breath, cough or hemoptysis.  He has no nausea, vomiting, diarrhea or constipation.  He has no headache or visual changes.  He has no recent weight loss or night sweats.  He had repeat CT scan of the chest performed recently and he is here for evaluation and discussion of his scan results before starting cycle #14.  MEDICAL HISTORY: Past Medical History:  Diagnosis Date   Diabetes mellitus ([facility])    Dyslipidemia    Hypertension     ALLERGIES:  has No Known Allergies.  MEDICATIONS:  Current Outpatient Medications  Medication Sig Dispense  Refill   atenolol (TENORMIN) 100 MG tablet Take 100 mg by mouth daily.     atorvastatin (LIPITOR) 20 MG tablet Take 20 mg by mouth daily.     famotidine (PEPCID) 40 MG tablet Take 40 mg by mouth daily.     gabapentin (NEURONTIN) 300 MG capsule Take 1 capsule (300 mg total) by mouth 3 (three) times daily. 270 capsule 0   losartan-hydrochlorothiazide (HYZAAR) 100-12.5 MG tablet Take 1 tablet by mouth daily.     metFORMIN (GLUCOPHAGE-XR) 500 MG 24 hr tablet Take 1,000 mg by mouth every evening.     methocarbamol (ROBAXIN) 500 MG tablet Take 1 tablet (500 mg total) by mouth 3 (three) times daily. 90 tablet 2   triamcinolone cream (KENALOG) 0.1 % Apply 1 Application topically 2 (two) times daily. 30 g 2   No current facility-administered medications for this visit.    SURGICAL HISTORY:  Past Surgical History:  Procedure Laterality Date   BRONCHIAL BIOPSY  11/15/2021   Procedure: BRONCHIAL BIOPSIES;  Surgeon: Garner Nash, DO;  Location: [facility];  Service: Pulmonary;;   BRONCHIAL BRUSHINGS  11/15/2021   Procedure: BRONCHIAL BRUSHINGS;  Surgeon: Garner Nash, DO;  Location: [facility];  Service: Pulmonary;;   FIDUCIAL MARKER PLACEMENT  11/15/2021   Procedure: FIDUCIAL DYE MARKING;  Surgeon: Garner Nash, DO;  Location: [facility];  Service: Pulmonary;;   INGUINAL HERNIA REPAIR     INTERCOSTAL NERVE BLOCK Left 11/15/2021   Procedure: INTERCOSTAL NERVE BLOCK;  Surgeon: Lajuana Matte, MD;  Location: [facility];  Service: Thoracic;  Laterality: Left;   LOBECTOMY Left 11/15/2021   Procedure: LEFT UPPER LOBECTOMY;  Surgeon: Lajuana Matte, MD;  Location: [facility];  Service: Thoracic;  Laterality: Left;   MEDIASTINAL EXPLORATION Left 11/15/2021   Procedure: MEDIASTINAL LYMPH NODE EXPLORATION;  Surgeon: Lajuana Matte, MD;  Location: [facility];  Service: Thoracic;  Laterality: Left;   NODE DISSECTION Left 11/15/2021   Procedure: NODE DISSECTION;  Surgeon: Lajuana Matte, MD;   Location: [facility];  Service: Thoracic;  Laterality: Left;   RIB PLATING Left 11/15/2021   Procedure: RIB PLATING;  Surgeon: Lajuana Matte, MD;  Location: [facility];  Service: Thoracic;  Laterality: Left;   THORACOTOMY/LOBECTOMY Left 11/15/2021   Procedure: EMERGENCY THORACOTOMY;  Surgeon: Lajuana Matte, MD;  Location: [facility];  Service: Thoracic;  Laterality: Left;   VIDEO BRONCHOSCOPY WITH RADIAL ENDOBRONCHIAL ULTRASOUND  11/15/2021   Procedure: VIDEO BRONCHOSCOPY WITH RADIAL ENDOBRONCHIAL ULTRASOUND;  Surgeon: Garner Nash, DO;  [facility];  Service: Pulmonary;;    REVIEW OF SYSTEMS:  Constitutional: negative Eyes: negative Ears, nose, mouth, throat, and face: negative Respiratory: negative Cardiovascular: negative Gastrointestinal: negative Genitourinary:negative Integument/breast: negative Hematologic/lymphatic: negative Musculoskeletal:negative Neurological: negative Behavioral/Psych: negative Endocrine: negative Allergic/Immunologic: negative   PHYSICAL EXAMINATION: General appearance: alert, cooperative, and no distress Head: Normocephalic, without obvious abnormality, atraumatic Neck: no adenopathy, no JVD, supple, symmetrical, trachea midline, and thyroid not enlarged, symmetric, no tenderness/mass/nodules Lymph nodes: Cervical, supraclavicular, and axillary nodes normal. Resp: clear to auscultation bilaterally Back: symmetric, no curvature. ROM normal. No CVA tenderness. Cardio: regular rate and rhythm, S1, S2 normal, no murmur, click, rub or gallop GI: soft, non-tender; bowel sounds normal; no masses,  no organomegaly Extremities: extremities normal, atraumatic, no cyanosis or edema Neurologic: Alert and oriented X 3, normal strength and tone. Normal symmetric reflexes. Normal coordination and gait  ECOG PERFORMANCE STATUS: 1 - Symptomatic but completely ambulatory  Blood pressure 137/78, pulse 63, resp. rate 18, weight 222 lb 5 oz (100.8 kg), SpO2  97 %.  LABORATORY DATA: Lab Results  Component Value Date   WBC 8.4 01/01/2023   HGB 12.5 (L) 01/01/2023   HCT 37.5 (L) 01/01/2023   MCV 83.7 01/01/2023   PLT 210 01/01/2023      Chemistry      Component Value Date/Time   NA 136 12/11/2022 0815   K 3.4 (L) 12/11/2022 0815   CL 103 12/11/2022 0815   CO2 22 12/11/2022 0815   BUN 31 (H) 12/11/2022 0815   CREATININE 1.82 (H) 12/11/2022 0815      Component Value Date/Time   CALCIUM 9.2 12/11/2022 0815   ALKPHOS 66 12/11/2022 0815   AST 20 12/11/2022 0815   ALT 16 12/11/2022 0815   BILITOT 0.6 12/11/2022 0815       RADIOGRAPHIC STUDIES: CT Chest W Contrast  Result Date: 01/21/2023 CLINICAL DATA:  Non-small cell lung cancer staging; * Tracking Code: BO * EXAM: CT CHEST WITH CONTRAST TECHNIQUE: Multidetector CT imaging of the chest was performed during intravenous contrast administration. RADIATION DOSE REDUCTION: This exam was performed according to the departmental dose-optimization program which includes automated exposure control, adjustment of the mA and/or kV according to patient size and/or use of iterative reconstruction technique. CONTRAST:  149mL OMNIPAQUE IOHEXOL 300 MG/ML  SOLN COMPARISON:  Multiple priors, most recent chest CT dated August 26, 2022 FINDINGS: Cardiovascular: Normal heart size. No pericardial effusion. Normal caliber thoracic aorta with moderate atherosclerotic disease. Severe coronary artery calcifications. Mediastinum/Nodes: Small hiatal hernia. No pathologically enlarged  lymph nodes seen in the chest. Lungs/Pleura: Prior left upper lobectomy. Remaining central airways are patent. Stable linear opacity of the left lower lobe, likely posttreatment change. New linear opacities of the paraspinal right lower lobe located adjacent to prominent osteophytes, likely focal scarring. Stable tiny solid pulmonary nodule of the left lower lobe measuring 3 mm on series 7, image 98. Stable trace left pleural effusion.  Upper Abdomen: Partially imaged simple appearing cyst of the left kidney, unchanged when compared with prior exam, no specific follow-up imaging is recommended. Musculoskeletal: Prior fixation of left-sided rib fractures. No aggressive appearing osseous lesions IMPRESSION: 1. Prior left upper lobectomy with no evidence of recurrent or metastatic disease. 2. Stable trace left pleural effusion. 3. Coronary artery calcifications and aortic Atherosclerosis (ICD10-I70.0). Electronically Signed   By: Yetta Glassman M.D.   On: 01/21/2023 09:17    ASSESSMENT AND PLAN: This is a very pleasant 72 years old white male diagnosed with a stage IIIa (T1c, N2, M0) non-small cell lung cancer, adenocarcinoma presented with left upper lobe lung nodule in addition to mediastinal lymphadenopathy diagnosed in December 2022.  The patient is status post left upper lobectomy with lymph node dissection.  The resection margins were negative for malignancy and there was no visceral pleural involvement but lymphovascular invasion. The patient has no actionable mutations and PD-L1 expression was 70% He completed 4 cycles of adjuvant systemic chemotherapy with carboplatin and paclitaxel.  He was not a good candidate for treatment with Alimta because of renal insufficiency. The patient is currently undergoing adjuvant treatment with immunotherapy with atezolizumab 1200 Mg IV every 3 weeks status post 13 cycles.   The patient has been tolerating this treatment fairly well with no concerning adverse effects. He had repeat CT scan of the chest performed recently.  I personally and independently reviewed the scan and discussed the result with the patient today. His scan showed no concerning findings for disease recurrence or metastasis. I recommended for him to continue his current treatment with adjuvant immunotherapy and he will proceed with cycle #14 today. The patient will come back for follow-up visit in 3 weeks for evaluation before  the next cycle of his treatment. He was advised to call immediately if he has any other concerning symptoms in the interval. The patient voices understanding of current disease status and treatment options and is in agreement with the current care plan.  All questions were answered. The patient knows to call the clinic with any problems, questions or concerns. We can certainly see the patient much sooner if necessary.   Disclaimer: This note was dictated with voice recognition software. Similar sounding words can inadvertently be transcribed and may not be corrected upon review.

## 2023-01-22 NOTE — Patient Instructions (Signed)
[facility]  Discharge Instructions: Thank you for choosing [facility] to provide your oncology and hematology care.   If you have a lab appointment with the [facility], please go directly to the [facility] and check in at the registration area.   Wear comfortable clothing and clothing appropriate for easy access to any Portacath or PICC line.   We strive to give you quality time with your provider. You may need to reschedule your appointment if you arrive late (15 or more minutes).  Arriving late affects you and other patients whose appointments are after yours.  Also, if you miss three or more appointments without notifying the office, you may be dismissed from the clinic at the provider's discretion.      For prescription refill requests, have your pharmacy contact our office and allow 72 hours for refills to be completed.    Today you received the following chemotherapy and/or immunotherapy agents Tecentriq      To help prevent nausea and vomiting after your treatment, we encourage you to take your nausea medication as directed.  BELOW ARE SYMPTOMS THAT SHOULD BE REPORTED IMMEDIATELY: *FEVER GREATER THAN 100.4 F (38 C) OR HIGHER *CHILLS OR SWEATING *NAUSEA AND VOMITING THAT IS NOT CONTROLLED WITH YOUR NAUSEA MEDICATION *UNUSUAL SHORTNESS OF BREATH *UNUSUAL BRUISING OR BLEEDING *URINARY PROBLEMS (pain or burning when urinating, or frequent urination) *BOWEL PROBLEMS (unusual diarrhea, constipation, pain near the anus) TENDERNESS IN MOUTH AND THROAT WITH OR WITHOUT PRESENCE OF ULCERS (sore throat, sores in mouth, or a toothache) UNUSUAL RASH, SWELLING OR PAIN  UNUSUAL VAGINAL DISCHARGE OR ITCHING   Items with * indicate a potential emergency and should be followed up as soon as possible or go to the Emergency Department if any problems should occur.  Please show the CHEMOTHERAPY ALERT CARD or IMMUNOTHERAPY ALERT CARD at  check-in to the Emergency Department and triage nurse.  Should you have questions after your visit or need to cancel or reschedule your appointment, please contact [facility]  Dept: [phone]  and follow the prompts.  Office hours are 8:00 a.m. to 4:30 p.m. Monday - Friday. Please note that voicemails left after 4:00 p.m. may not be returned until the following business day.  We are closed weekends and major holidays. You have access to a nurse at all times for urgent questions. Please call the main number to the clinic Dept: [phone] and follow the prompts.   For any non-urgent questions, you may also contact your provider using MyChart. We now offer e-Visits for anyone 28 and older to request care online for non-urgent symptoms. For details visit [email or web address].   Also download the MyChart app! Go to the app store, search "MyChart", open the app, select [facility], and log in with your MyChart username and password.

## 2023-01-24 LAB — T4: T4, Total: 7.3 ug/dL (ref 4.5–12.0)

## 2023-01-26 ENCOUNTER — Other Ambulatory Visit: Payer: Self-pay

## 2023-01-27 ENCOUNTER — Telehealth: Payer: Self-pay | Admitting: Internal Medicine

## 2023-01-27 NOTE — Telephone Encounter (Signed)
Called patient regarding February/March/April appointments. Left a voicemail.

## 2023-01-28 ENCOUNTER — Other Ambulatory Visit: Payer: Self-pay

## 2023-01-29 ENCOUNTER — Other Ambulatory Visit: Payer: Self-pay

## 2023-02-12 ENCOUNTER — Other Ambulatory Visit: Payer: Medicare Other

## 2023-02-12 ENCOUNTER — Inpatient Hospital Stay: Payer: Medicare Other

## 2023-02-12 ENCOUNTER — Encounter: Payer: Self-pay | Admitting: Internal Medicine

## 2023-02-12 ENCOUNTER — Inpatient Hospital Stay (HOSPITAL_BASED_OUTPATIENT_CLINIC_OR_DEPARTMENT_OTHER): Payer: Medicare Other | Admitting: Internal Medicine

## 2023-02-12 ENCOUNTER — Ambulatory Visit: Payer: Medicare Other

## 2023-02-12 ENCOUNTER — Encounter: Payer: Self-pay | Admitting: Medical Oncology

## 2023-02-12 ENCOUNTER — Ambulatory Visit: Payer: Medicare Other | Admitting: Internal Medicine

## 2023-02-12 VITALS — BP 139/80 | HR 62 | Temp 97.8°F | Resp 18

## 2023-02-12 DIAGNOSIS — C3492 Malignant neoplasm of unspecified part of left bronchus or lung: Secondary | ICD-10-CM | POA: Diagnosis not present

## 2023-02-12 DIAGNOSIS — Z5112 Encounter for antineoplastic immunotherapy: Secondary | ICD-10-CM | POA: Diagnosis not present

## 2023-02-12 LAB — CBC WITH DIFFERENTIAL (CANCER CENTER ONLY)
Abs Immature Granulocytes: 0.01 10*3/uL (ref 0.00–0.07)
Basophils Absolute: 0.1 10*3/uL (ref 0.0–0.1)
Basophils Relative: 1 %
Eosinophils Absolute: 0.2 10*3/uL (ref 0.0–0.5)
Eosinophils Relative: 3 %
HCT: 40.2 % (ref 39.0–52.0)
Hemoglobin: 13.3 g/dL (ref 13.0–17.0)
Immature Granulocytes: 0 %
Lymphocytes Relative: 48 %
Lymphs Abs: 3.5 10*3/uL (ref 0.7–4.0)
MCH: 27.9 pg (ref 26.0–34.0)
MCHC: 33.1 g/dL (ref 30.0–36.0)
MCV: 84.5 fL (ref 80.0–100.0)
Monocytes Absolute: 0.8 10*3/uL (ref 0.1–1.0)
Monocytes Relative: 11 %
Neutro Abs: 2.7 10*3/uL (ref 1.7–7.7)
Neutrophils Relative %: 37 %
Platelet Count: 203 10*3/uL (ref 150–400)
RBC: 4.76 MIL/uL (ref 4.22–5.81)
RDW: 15.2 % (ref 11.5–15.5)
WBC Count: 7.3 10*3/uL (ref 4.0–10.5)
nRBC: 0 % (ref 0.0–0.2)

## 2023-02-12 LAB — CMP (CANCER CENTER ONLY)
ALT: 22 U/L (ref 0–44)
AST: 25 U/L (ref 15–41)
Albumin: 4.2 g/dL (ref 3.5–5.0)
Alkaline Phosphatase: 61 U/L (ref 38–126)
Anion gap: 12 (ref 5–15)
BUN: 24 mg/dL — ABNORMAL HIGH (ref 8–23)
CO2: 21 mmol/L — ABNORMAL LOW (ref 22–32)
Calcium: 8.6 mg/dL — ABNORMAL LOW (ref 8.9–10.3)
Chloride: 105 mmol/L (ref 98–111)
Creatinine: 1.32 mg/dL — ABNORMAL HIGH (ref 0.61–1.24)
GFR, Estimated: 58 mL/min — ABNORMAL LOW (ref >60)
Glucose, Bld: 174 mg/dL — ABNORMAL HIGH (ref 70–99)
Potassium: 3.7 mmol/L (ref 3.5–5.1)
Sodium: 138 mmol/L (ref 135–145)
Total Bilirubin: 0.6 mg/dL (ref 0.3–1.2)
Total Protein: 7.1 g/dL (ref 6.5–8.1)

## 2023-02-12 LAB — TSH: TSH: 4.261 u[IU]/mL (ref 0.350–4.500)

## 2023-02-12 MED ORDER — SODIUM CHLORIDE 0.9 % IV SOLN: Freq: Once | INTRAVENOUS | Status: AC

## 2023-02-12 MED ORDER — SODIUM CHLORIDE 0.9 % IV SOLN
1200.0000 mg | Freq: Once | INTRAVENOUS | Status: AC
  Administered 2023-02-12: 1200 mg via INTRAVENOUS
  Filled 2023-02-12: qty 20

## 2023-02-12 NOTE — Patient Instructions (Signed)
[facility]  Discharge Instructions: Thank you for choosing [facility] to provide your oncology and hematology care.   If you have a lab appointment with the Cancer Center, please go directly to the Cancer Center and check in at the registration area.   Wear comfortable clothing and clothing appropriate for easy access to any Portacath or PICC line.   We strive to give you quality time with your provider. You may need to reschedule your appointment if you arrive late (15 or more minutes).  Arriving late affects you and other patients whose appointments are after yours.  Also, if you miss three or more appointments without notifying the office, you may be dismissed from the clinic at the provider's discretion.      For prescription refill requests, have your pharmacy contact our office and allow 72 hours for refills to be completed.    Today you received the following chemotherapy and/or immunotherapy agents: Tecentriq      To help prevent nausea and vomiting after your treatment, we encourage you to take your nausea medication as directed.  BELOW ARE SYMPTOMS THAT SHOULD BE REPORTED IMMEDIATELY: *FEVER GREATER THAN 100.4 F (38 C) OR HIGHER *CHILLS OR SWEATING *NAUSEA AND VOMITING THAT IS NOT CONTROLLED WITH YOUR NAUSEA MEDICATION *UNUSUAL SHORTNESS OF BREATH *UNUSUAL BRUISING OR BLEEDING *URINARY PROBLEMS (pain or burning when urinating, or frequent urination) *BOWEL PROBLEMS (unusual diarrhea, constipation, pain near the anus) TENDERNESS IN MOUTH AND THROAT WITH OR WITHOUT PRESENCE OF ULCERS (sore throat, sores in mouth, or a toothache) UNUSUAL RASH, SWELLING OR PAIN  UNUSUAL VAGINAL DISCHARGE OR ITCHING   Items with * indicate a potential emergency and should be followed up as soon as possible or go to the Emergency Department if any problems should occur.  Please show the CHEMOTHERAPY ALERT CARD or IMMUNOTHERAPY ALERT CARD at  check-in to the Emergency Department and triage nurse.  Should you have questions after your visit or need to cancel or reschedule your appointment, please contact [facility]  Dept: [phone]  and follow the prompts.  Office hours are 8:00 a.m. to 4:30 p.m. Monday - Friday. Please note that voicemails left after 4:00 p.m. may not be returned until the following business day.  We are closed weekends and major holidays. You have access to a nurse at all times for urgent questions. Please call the main number to the clinic Dept: [phone] and follow the prompts.   For any non-urgent questions, you may also contact your provider using MyChart. We now offer e-Visits for anyone 18 and older to request care online for non-urgent symptoms. For details visit [email or web address].   Also download the MyChart app! Go to the app store, search "MyChart", open the app, select [facility], and log in with your MyChart username and password.   

## 2023-02-12 NOTE — Progress Notes (Signed)
Patient seen by MD today  Vitals are within treatment parameters.  Labs reviewed: and are within treatment parameters.  Per physician team, patient is ready for treatment and there are NO modifications to the treatment plan.

## 2023-02-12 NOTE — Progress Notes (Signed)
[facility] [phone]   Fax:([phone]) [place], [place] [address]  DIAGNOSIS: Stage IIIA (T1c, N2, M0) non-small cell lung cancer, moderately differentiated adenocarcinoma diagnosed in December 2022.  PD-L1 expression is 70%.    Molecular study showed no actionable mutation.  It has only MAP2K1  PRIOR THERAPY:  1) Status post robotic assisted left video thoracoscopy with emergency thoracotomy and left upper lobectomy and mediastinal lymph node sampling under the care of Dr. Kipp Brood on November 15, 2021. 2) Adjuvant systemic chemotherapy with carboplatin for AUC of 6 and paclitaxel 200 Mg/M2 with Neulasta support every 3 weeks.  First dose January 08, 2022.  The patient was not eligible for treatment with cisplatin and Alimta because of renal insufficiency.  Status post 4 cycles.  Last dose was given on March 11, 2022.  CURRENT THERAPY: Adjuvant treatment with immunotherapy with atezolizumab 1200 Mg IV every 3 weeks.  First dose 04/23/2022.  Status post 14 cycles.  INTERVAL HISTORY: [name] 72 y.o. male returns to the clinic today for follow-up visit.  The patient is feeling fine today with no concerning complaints.  He denied having any current chest pain, shortness of breath, cough or hemoptysis.  He has no nausea, vomiting, diarrhea or constipation.  He has no headache or visual changes.  He denied having any significant weight loss or night sweats.  He has been tolerating his treatment with maintenance atezolizumab fairly well.  He is here today for evaluation before starting cycle #15.   MEDICAL HISTORY: Past Medical History:  Diagnosis Date   Diabetes mellitus ([place])    Dyslipidemia    Hypertension     ALLERGIES:  has No Known Allergies.  MEDICATIONS:  Current Outpatient Medications  Medication Sig Dispense Refill   atenolol (TENORMIN) 100 MG tablet Take 100 mg  by mouth daily.     atorvastatin (LIPITOR) 20 MG tablet Take 20 mg by mouth daily.     famotidine (PEPCID) 40 MG tablet Take 40 mg by mouth daily.     gabapentin (NEURONTIN) 300 MG capsule Take 1 capsule (300 mg total) by mouth 3 (three) times daily. 270 capsule 0   losartan-hydrochlorothiazide (HYZAAR) 100-12.5 MG tablet Take 1 tablet by mouth daily.     metFORMIN (GLUCOPHAGE-XR) 500 MG 24 hr tablet Take 1,000 mg by mouth every evening.     methocarbamol (ROBAXIN) 500 MG tablet Take 1 tablet (500 mg total) by mouth 3 (three) times daily. 90 tablet 2   triamcinolone cream (KENALOG) 0.1 % Apply 1 Application topically 2 (two) times daily. 30 g 2   No current facility-administered medications for this visit.    SURGICAL HISTORY:  Past Surgical History:  Procedure Laterality Date   BRONCHIAL BIOPSY  11/15/2021   Procedure: BRONCHIAL BIOPSIES;  Surgeon: Garner Nash, DO;  Location: [facility];  Service: Pulmonary;;   BRONCHIAL BRUSHINGS  11/15/2021   Procedure: BRONCHIAL BRUSHINGS;  Surgeon: Garner Nash, DO;  Location: [facility];  Service: Pulmonary;;   FIDUCIAL MARKER PLACEMENT  11/15/2021   Procedure: FIDUCIAL DYE MARKING;  Surgeon: Garner Nash, DO;  Location: [facility];  Service: Pulmonary;;   INGUINAL HERNIA REPAIR     INTERCOSTAL NERVE BLOCK Left 11/15/2021   Procedure: INTERCOSTAL NERVE BLOCK;  Surgeon: Lajuana Matte, MD;  Location: [facility];  Service: Thoracic;  Laterality: Left;   LOBECTOMY Left 11/15/2021   Procedure: LEFT UPPER  LOBECTOMY;  Surgeon: Lajuana Matte, MD;  Location: [facility];  Service: Thoracic;  Laterality: Left;   MEDIASTINAL EXPLORATION Left 11/15/2021   Procedure: MEDIASTINAL LYMPH NODE EXPLORATION;  Surgeon: Lajuana Matte, MD;  Location: [facility];  Service: Thoracic;  Laterality: Left;   NODE DISSECTION Left 11/15/2021   Procedure: NODE DISSECTION;  Surgeon: Lajuana Matte, MD;  Location: [facility];  Service: Thoracic;  Laterality:  Left;   RIB PLATING Left 11/15/2021   Procedure: RIB PLATING;  Surgeon: Lajuana Matte, MD;  Location: [facility];  Service: Thoracic;  Laterality: Left;   THORACOTOMY/LOBECTOMY Left 11/15/2021   Procedure: EMERGENCY THORACOTOMY;  Surgeon: Lajuana Matte, MD;  Location: [facility];  Service: Thoracic;  Laterality: Left;   VIDEO BRONCHOSCOPY WITH RADIAL ENDOBRONCHIAL ULTRASOUND  11/15/2021   Procedure: VIDEO BRONCHOSCOPY WITH RADIAL ENDOBRONCHIAL ULTRASOUND;  Surgeon: Garner Nash, DO;  [facility];  Service: Pulmonary;;    REVIEW OF SYSTEMS:  A comprehensive review of systems was negative except for: Respiratory: positive for pleurisy/chest pain   PHYSICAL EXAMINATION: General appearance: alert, cooperative, and no distress Head: Normocephalic, without obvious abnormality, atraumatic Neck: no adenopathy, no JVD, supple, symmetrical, trachea midline, and thyroid not enlarged, symmetric, no tenderness/mass/nodules Lymph nodes: Cervical, supraclavicular, and axillary nodes normal. Resp: clear to auscultation bilaterally Back: symmetric, no curvature. ROM normal. No CVA tenderness. Cardio: regular rate and rhythm, S1, S2 normal, no murmur, click, rub or gallop GI: soft, non-tender; bowel sounds normal; no masses,  no organomegaly Extremities: extremities normal, atraumatic, no cyanosis or edema  ECOG PERFORMANCE STATUS: 1 - Symptomatic but completely ambulatory  Blood pressure 137/78, pulse 63, resp. rate 18, weight 222 lb 5 oz (100.8 kg), SpO2 97 %.  LABORATORY DATA: Lab Results  Component Value Date   WBC 8.4 01/01/2023   HGB 12.5 (L) 01/01/2023   HCT 37.5 (L) 01/01/2023   MCV 83.7 01/01/2023   PLT 210 01/01/2023      Chemistry      Component Value Date/Time   NA 136 12/11/2022 0815   K 3.4 (L) 12/11/2022 0815   CL 103 12/11/2022 0815   CO2 22 12/11/2022 0815   BUN 31 (H) 12/11/2022 0815   CREATININE 1.82 (H) 12/11/2022 0815      Component Value Date/Time    CALCIUM 9.2 12/11/2022 0815   ALKPHOS 66 12/11/2022 0815   AST 20 12/11/2022 0815   ALT 16 12/11/2022 0815   BILITOT 0.6 12/11/2022 0815       RADIOGRAPHIC STUDIES: CT Chest W Contrast  Result Date: 01/21/2023 CLINICAL DATA:  Non-small cell lung cancer staging; * Tracking Code: BO * EXAM: CT CHEST WITH CONTRAST TECHNIQUE: Multidetector CT imaging of the chest was performed during intravenous contrast administration. RADIATION DOSE REDUCTION: This exam was performed according to the departmental dose-optimization program which includes automated exposure control, adjustment of the mA and/or kV according to patient size and/or use of iterative reconstruction technique. CONTRAST:  131m OMNIPAQUE IOHEXOL 300 MG/ML  SOLN COMPARISON:  Multiple priors, most recent chest CT dated August 26, 2022 FINDINGS: Cardiovascular: Normal heart size. No pericardial effusion. Normal caliber thoracic aorta with moderate atherosclerotic disease. Severe coronary artery calcifications. Mediastinum/Nodes: Small hiatal hernia. No pathologically enlarged lymph nodes seen in the chest. Lungs/Pleura: Prior left upper lobectomy. Remaining central airways are patent. Stable linear opacity of the left lower lobe, likely posttreatment change. New linear opacities of the paraspinal right lower lobe located adjacent to prominent osteophytes, likely focal scarring. Stable tiny  solid pulmonary nodule of the left lower lobe measuring 3 mm on series 7, image 98. Stable trace left pleural effusion. Upper Abdomen: Partially imaged simple appearing cyst of the left kidney, unchanged when compared with prior exam, no specific follow-up imaging is recommended. Musculoskeletal: Prior fixation of left-sided rib fractures. No aggressive appearing osseous lesions IMPRESSION: 1. Prior left upper lobectomy with no evidence of recurrent or metastatic disease. 2. Stable trace left pleural effusion. 3. Coronary artery calcifications and aortic  Atherosclerosis (ICD10-I70.0). Electronically Signed   By: Yetta Glassman M.D.   On: 01/21/2023 09:17    ASSESSMENT AND PLAN: This is a very pleasant 72 years old white male diagnosed with a stage IIIa (T1c, N2, M0) non-small cell lung cancer, adenocarcinoma presented with left upper lobe lung nodule in addition to mediastinal lymphadenopathy diagnosed in December 2022.  The patient is status post left upper lobectomy with lymph node dissection.  The resection margins were negative for malignancy and there was no visceral pleural involvement but lymphovascular invasion. The patient has no actionable mutations and PD-L1 expression was 70% He completed 4 cycles of adjuvant systemic chemotherapy with carboplatin and paclitaxel.  He was not a good candidate for treatment with Alimta because of renal insufficiency. The patient is currently undergoing adjuvant treatment with immunotherapy with atezolizumab 1200 Mg IV every 3 weeks status post 14 cycles.   The patient has been tolerating this treatment well with no concerning adverse effects. I recommended for him to proceed with cycle #15 today as planned. I will see him back for follow-up visit in 3 weeks for evaluation before starting cycle #16. The patient was advised to call immediately if he has any other concerning symptoms in the interval. The patient voices understanding of current disease status and treatment options and is in agreement with the current care plan.  All questions were answered. The patient knows to call the clinic with any problems, questions or concerns. We can certainly see the patient much sooner if necessary.   Disclaimer: This note was dictated with voice recognition software. Similar sounding words can inadvertently be transcribed and may not be corrected upon review.

## 2023-02-14 LAB — T4: T4, Total: 6.3 ug/dL (ref 4.5–12.0)

## 2023-02-19 ENCOUNTER — Other Ambulatory Visit: Payer: Self-pay

## 2023-03-05 ENCOUNTER — Inpatient Hospital Stay: Payer: Medicare Other | Attending: Internal Medicine

## 2023-03-05 ENCOUNTER — Encounter: Payer: Self-pay | Admitting: Internal Medicine

## 2023-03-05 ENCOUNTER — Encounter: Payer: Self-pay | Admitting: Medical Oncology

## 2023-03-05 ENCOUNTER — Inpatient Hospital Stay (HOSPITAL_BASED_OUTPATIENT_CLINIC_OR_DEPARTMENT_OTHER): Payer: Medicare Other | Admitting: Internal Medicine

## 2023-03-05 ENCOUNTER — Inpatient Hospital Stay: Payer: Medicare Other

## 2023-03-05 VITALS — BP 144/95 | HR 74 | Resp 17

## 2023-03-05 DIAGNOSIS — Z79899 Other long term (current) drug therapy: Secondary | ICD-10-CM | POA: Insufficient documentation

## 2023-03-05 DIAGNOSIS — C3492 Malignant neoplasm of unspecified part of left bronchus or lung: Secondary | ICD-10-CM | POA: Diagnosis not present

## 2023-03-05 DIAGNOSIS — E1140 Type 2 diabetes mellitus with diabetic neuropathy, unspecified: Secondary | ICD-10-CM | POA: Diagnosis not present

## 2023-03-05 DIAGNOSIS — C3412 Malignant neoplasm of upper lobe, left bronchus or lung: Secondary | ICD-10-CM | POA: Diagnosis present

## 2023-03-05 DIAGNOSIS — Z5112 Encounter for antineoplastic immunotherapy: Secondary | ICD-10-CM | POA: Diagnosis not present

## 2023-03-05 DIAGNOSIS — N289 Disorder of kidney and ureter, unspecified: Secondary | ICD-10-CM | POA: Diagnosis not present

## 2023-03-05 LAB — CBC WITH DIFFERENTIAL (CANCER CENTER ONLY)
Abs Immature Granulocytes: 0.02 10*3/uL (ref 0.00–0.07)
Basophils Absolute: 0.1 10*3/uL (ref 0.0–0.1)
Basophils Relative: 1 %
Eosinophils Absolute: 0.2 10*3/uL (ref 0.0–0.5)
Eosinophils Relative: 3 %
HCT: 36.3 % — ABNORMAL LOW (ref 39.0–52.0)
Hemoglobin: 12.1 g/dL — ABNORMAL LOW (ref 13.0–17.0)
Immature Granulocytes: 0 %
Lymphocytes Relative: 36 %
Lymphs Abs: 2.5 10*3/uL (ref 0.7–4.0)
MCH: 28 pg (ref 26.0–34.0)
MCHC: 33.3 g/dL (ref 30.0–36.0)
MCV: 84 fL (ref 80.0–100.0)
Monocytes Absolute: 0.7 10*3/uL (ref 0.1–1.0)
Monocytes Relative: 11 %
Neutro Abs: 3.4 10*3/uL (ref 1.7–7.7)
Neutrophils Relative %: 49 %
Platelet Count: 182 10*3/uL (ref 150–400)
RBC: 4.32 MIL/uL (ref 4.22–5.81)
RDW: 15.1 % (ref 11.5–15.5)
WBC Count: 7 10*3/uL (ref 4.0–10.5)
nRBC: 0 % (ref 0.0–0.2)

## 2023-03-05 LAB — TSH: TSH: 2.147 u[IU]/mL (ref 0.350–4.500)

## 2023-03-05 LAB — CMP (CANCER CENTER ONLY)
ALT: 19 U/L (ref 0–44)
AST: 23 U/L (ref 15–41)
Albumin: 4.1 g/dL (ref 3.5–5.0)
Alkaline Phosphatase: 67 U/L (ref 38–126)
Anion gap: 12 (ref 5–15)
BUN: 23 mg/dL (ref 8–23)
CO2: 24 mmol/L (ref 22–32)
Calcium: 8.7 mg/dL — ABNORMAL LOW (ref 8.9–10.3)
Chloride: 103 mmol/L (ref 98–111)
Creatinine: 1.34 mg/dL — ABNORMAL HIGH (ref 0.61–1.24)
GFR, Estimated: 57 mL/min — ABNORMAL LOW (ref >60)
Glucose, Bld: 173 mg/dL — ABNORMAL HIGH (ref 70–99)
Potassium: 3.3 mmol/L — ABNORMAL LOW (ref 3.5–5.1)
Sodium: 139 mmol/L (ref 135–145)
Total Bilirubin: 0.4 mg/dL (ref 0.3–1.2)
Total Protein: 7.1 g/dL (ref 6.5–8.1)

## 2023-03-05 MED ORDER — METHOCARBAMOL 500 MG PO TABS: 500.0000 mg | ORAL_TABLET | Freq: Three times a day (TID) | ORAL | 2 refills | Status: DC

## 2023-03-05 MED ORDER — GABAPENTIN 300 MG PO CAPS: 300.0000 mg | ORAL_CAPSULE | Freq: Three times a day (TID) | ORAL | 1 refills | Status: DC

## 2023-03-05 MED ORDER — SODIUM CHLORIDE 0.9 % IV SOLN
1200.0000 mg | Freq: Once | INTRAVENOUS | Status: AC
  Administered 2023-03-05: 1200 mg via INTRAVENOUS
  Filled 2023-03-05: qty 20

## 2023-03-05 MED ORDER — SODIUM CHLORIDE 0.9 % IV SOLN: Freq: Once | INTRAVENOUS | Status: AC

## 2023-03-05 NOTE — Progress Notes (Signed)
[facility] Telephone:([phone]   Fax:([phone]) [place], MD [place] [address]  DIAGNOSIS: Stage IIIA (T1c, N2, M0) non-small cell lung cancer, moderately differentiated adenocarcinoma diagnosed in December 2022.  PD-L1 expression is 70%.    Molecular study showed no actionable mutation.  It has only MAP2K1  PRIOR THERAPY:  1) Status post robotic assisted left video thoracoscopy with emergency thoracotomy and left upper lobectomy and mediastinal lymph node sampling under the care of Dr. Kipp Brood on November 15, 2021. 2) Adjuvant systemic chemotherapy with carboplatin for AUC of 6 and paclitaxel 200 Mg/M2 with Neulasta support every 3 weeks.  First dose January 08, 2022.  The patient was not eligible for treatment with cisplatin and Alimta because of renal insufficiency.  Status post 4 cycles.  Last dose was given on March 11, 2022.  CURRENT THERAPY: Adjuvant treatment with immunotherapy with atezolizumab 1200 Mg IV every 3 weeks.  First dose 04/23/2022.  Status post 15 cycles.  INTERVAL HISTORY: [name] 72 y.o. male returns to the clinic today for follow-up visit.  The patient is feeling fine today with no concerning complaints.  He denied having any chest pain, shortness of breath, cough or hemoptysis.  He has no nausea, vomiting, diarrhea or constipation.  He has no headache or visual changes.  He continues to tolerate his adjuvant immunotherapy with atezolizumab fairly well.  He is here for evaluation before starting cycle #16.  He requested refill of gabapentin and Robaxin.    MEDICAL HISTORY: Past Medical History:  Diagnosis Date   Diabetes mellitus ([employer])    Dyslipidemia    Hypertension     ALLERGIES:  has No Known Allergies.  MEDICATIONS:  Current Outpatient Medications  Medication Sig Dispense Refill   atenolol (TENORMIN) 100 MG tablet Take 100 mg by mouth daily.      atorvastatin (LIPITOR) 20 MG tablet Take 20 mg by mouth daily.     famotidine (PEPCID) 40 MG tablet Take 40 mg by mouth daily.     gabapentin (NEURONTIN) 300 MG capsule Take 1 capsule (300 mg total) by mouth 3 (three) times daily. 270 capsule 0   losartan-hydrochlorothiazide (HYZAAR) 100-12.5 MG tablet Take 1 tablet by mouth daily.     metFORMIN (GLUCOPHAGE-XR) 500 MG 24 hr tablet Take 1,000 mg by mouth every evening.     methocarbamol (ROBAXIN) 500 MG tablet Take 1 tablet (500 mg total) by mouth 3 (three) times daily. 90 tablet 2   triamcinolone cream (KENALOG) 0.1 % Apply 1 Application topically 2 (two) times daily. 30 g 2   No current facility-administered medications for this visit.    SURGICAL HISTORY:  Past Surgical History:  Procedure Laterality Date   BRONCHIAL BIOPSY  11/15/2021   Procedure: BRONCHIAL BIOPSIES;  Surgeon: Garner Nash, DO;  Location: [facility];  Service: Pulmonary;;   BRONCHIAL BRUSHINGS  11/15/2021   Procedure: BRONCHIAL BRUSHINGS;  Surgeon: Garner Nash, DO;  Location: [facility];  Service: Pulmonary;;   FIDUCIAL MARKER PLACEMENT  11/15/2021   Procedure: FIDUCIAL DYE MARKING;  Surgeon: Garner Nash, DO;  Location: [facility];  Service: Pulmonary;;   INGUINAL HERNIA REPAIR     INTERCOSTAL NERVE BLOCK Left 11/15/2021   Procedure: INTERCOSTAL NERVE BLOCK;  Surgeon: Lajuana Matte, MD;  Location: [facility];  Service: Thoracic;  Laterality: Left;   LOBECTOMY Left 11/15/2021   Procedure: LEFT UPPER LOBECTOMY;  Surgeon: Kipp Brood,  Lucile Crater, MD;  Location: [facility];  Service: Thoracic;  Laterality: Left;   MEDIASTINAL EXPLORATION Left 11/15/2021   Procedure: MEDIASTINAL LYMPH NODE EXPLORATION;  Surgeon: Lajuana Matte, MD;  Location: [facility];  Service: Thoracic;  Laterality: Left;   NODE DISSECTION Left 11/15/2021   Procedure: NODE DISSECTION;  Surgeon: Lajuana Matte, MD;  Location: [facility];  Service: Thoracic;  Laterality: Left;   RIB PLATING Left  11/15/2021   Procedure: RIB PLATING;  Surgeon: Lajuana Matte, MD;  Location: [facility];  Service: Thoracic;  Laterality: Left;   THORACOTOMY/LOBECTOMY Left 11/15/2021   Procedure: EMERGENCY THORACOTOMY;  Surgeon: Lajuana Matte, MD;  Location: [facility];  Service: Thoracic;  Laterality: Left;   VIDEO BRONCHOSCOPY WITH RADIAL ENDOBRONCHIAL ULTRASOUND  11/15/2021   Procedure: VIDEO BRONCHOSCOPY WITH RADIAL ENDOBRONCHIAL ULTRASOUND;  Surgeon: Garner Nash, DO;  [facility];  Service: Pulmonary;;    REVIEW OF SYSTEMS:  A comprehensive review of systems was negative.   PHYSICAL EXAMINATION: General appearance: alert, cooperative, and no distress Head: Normocephalic, without obvious abnormality, atraumatic Neck: no adenopathy, no JVD, supple, symmetrical, trachea midline, and thyroid not enlarged, symmetric, no tenderness/mass/nodules Lymph nodes: Cervical, supraclavicular, and axillary nodes normal. Resp: clear to auscultation bilaterally Back: symmetric, no curvature. ROM normal. No CVA tenderness. Cardio: regular rate and rhythm, S1, S2 normal, no murmur, click, rub or gallop GI: soft, non-tender; bowel sounds normal; no masses,  no organomegaly Extremities: extremities normal, atraumatic, no cyanosis or edema  ECOG PERFORMANCE STATUS: 1 - Symptomatic but completely ambulatory  Blood pressure (!) 150/87, pulse 71, temperature 97.6 F (36.4 C), temperature source Oral, resp. rate 18, weight 228 lb (103.4 kg), SpO2 97 %.  LABORATORY DATA: Lab Results  Component Value Date   WBC 7.3 02/12/2023   HGB 13.3 02/12/2023   HCT 40.2 02/12/2023   MCV 84.5 02/12/2023   PLT 203 02/12/2023      Chemistry      Component Value Date/Time   NA 138 02/12/2023 0744   K 3.7 02/12/2023 0744   CL 105 02/12/2023 0744   CO2 21 (L) 02/12/2023 0744   BUN 24 (H) 02/12/2023 0744   CREATININE 1.32 (H) 02/12/2023 0744      Component Value Date/Time   CALCIUM 8.6 (L) 02/12/2023 0744    ALKPHOS 61 02/12/2023 0744   AST 25 02/12/2023 0744   ALT 22 02/12/2023 0744   BILITOT 0.6 02/12/2023 0744       RADIOGRAPHIC STUDIES: No results found.  ASSESSMENT AND PLAN: This is a very pleasant 72 years old white male diagnosed with a stage IIIa (T1c, N2, M0) non-small cell lung cancer, adenocarcinoma presented with left upper lobe lung nodule in addition to mediastinal lymphadenopathy diagnosed in December 2022.  The patient is status post left upper lobectomy with lymph node dissection.  The resection margins were negative for malignancy and there was no visceral pleural involvement but lymphovascular invasion. The patient has no actionable mutations and PD-L1 expression was 70% He completed 4 cycles of adjuvant systemic chemotherapy with carboplatin and paclitaxel.  He was not a good candidate for treatment with Alimta because of renal insufficiency. The patient is currently undergoing adjuvant treatment with immunotherapy with atezolizumab 1200 Mg IV every 3 weeks status post 15 cycles.   The patient has been tolerating this treatment well with no concerning adverse effects. I recommended for him to proceed with cycle #16 today as planned. For the neuropathy and muscle spasm, I will give  him refill of gabapentin and Robaxin. The patient will come back for follow-up visit in 3 weeks for evaluation before starting cycle #17. He was advised to call immediately if he has any other concerning symptoms in the interval. The patient voices understanding of current disease status and treatment options and is in agreement with the current care plan.  All questions were answered. The patient knows to call the clinic with any problems, questions or concerns. We can certainly see the patient much sooner if necessary.   Disclaimer: This note was dictated with voice recognition software. Similar sounding words can inadvertently be transcribed and may not be corrected upon review.

## 2023-03-05 NOTE — Patient Instructions (Signed)
[facility]  Discharge Instructions: Thank you for choosing [facility] to provide your oncology and hematology care.   If you have a lab appointment with the Cancer Center, please go directly to the Cancer Center and check in at the registration area.   Wear comfortable clothing and clothing appropriate for easy access to any Portacath or PICC line.   We strive to give you quality time with your provider. You may need to reschedule your appointment if you arrive late (15 or more minutes).  Arriving late affects you and other patients whose appointments are after yours.  Also, if you miss three or more appointments without notifying the office, you may be dismissed from the clinic at the provider's discretion.      For prescription refill requests, have your pharmacy contact our office and allow 72 hours for refills to be completed.    Today you received the following chemotherapy and/or immunotherapy agents Tecentriq      To help prevent nausea and vomiting after your treatment, we encourage you to take your nausea medication as directed.  BELOW ARE SYMPTOMS THAT SHOULD BE REPORTED IMMEDIATELY: *FEVER GREATER THAN 100.4 F (38 C) OR HIGHER *CHILLS OR SWEATING *NAUSEA AND VOMITING THAT IS NOT CONTROLLED WITH YOUR NAUSEA MEDICATION *UNUSUAL SHORTNESS OF BREATH *UNUSUAL BRUISING OR BLEEDING *URINARY PROBLEMS (pain or burning when urinating, or frequent urination) *BOWEL PROBLEMS (unusual diarrhea, constipation, pain near the anus) TENDERNESS IN MOUTH AND THROAT WITH OR WITHOUT PRESENCE OF ULCERS (sore throat, sores in mouth, or a toothache) UNUSUAL RASH, SWELLING OR PAIN  UNUSUAL VAGINAL DISCHARGE OR ITCHING   Items with * indicate a potential emergency and should be followed up as soon as possible or go to the Emergency Department if any problems should occur.  Please show the CHEMOTHERAPY ALERT CARD or IMMUNOTHERAPY ALERT CARD at  check-in to the Emergency Department and triage nurse.  Should you have questions after your visit or need to cancel or reschedule your appointment, please contact [facility]  Dept: [phone]  and follow the prompts.  Office hours are 8:00 a.m. to 4:30 p.m. Monday - Friday. Please note that voicemails left after 4:00 p.m. may not be returned until the following business day.  We are closed weekends and major holidays. You have access to a nurse at all times for urgent questions. Please call the main number to the clinic Dept: [phone] and follow the prompts.   For any non-urgent questions, you may also contact your provider using MyChart. We now offer e-Visits for anyone 18 and older to request care online for non-urgent symptoms. For details visit [email or web address].   Also download the MyChart app! Go to the app store, search "MyChart", open the app, select [facility], and log in with your MyChart username and password. 

## 2023-03-05 NOTE — Progress Notes (Signed)
Patient seen by MD today  Vitals are within treatment parameters.  Labs reviewed: and are within treatment parameters.  Per physician team, patient is ready for treatment and there are NO modifications to the treatment plan.  

## 2023-03-06 LAB — T4: T4, Total: 5.1 ug/dL (ref 4.5–12.0)

## 2023-03-14 ENCOUNTER — Telehealth: Payer: Self-pay | Admitting: Internal Medicine

## 2023-03-14 NOTE — Telephone Encounter (Signed)
Rescheduled 04/10 appointments per provider pal, patient is notified.

## 2023-03-18 ENCOUNTER — Other Ambulatory Visit: Payer: Self-pay

## 2023-03-20 ENCOUNTER — Other Ambulatory Visit: Payer: Self-pay

## 2023-03-25 NOTE — Progress Notes (Unsigned)
[facility] OFFICE PROGRESS NOTE  Si Gaul, MD [address] [place] [address]  DIAGNOSIS: Stage IIIA (T1c, N2, M0) non-small cell lung cancer, moderately differentiated adenocarcinoma diagnosed in December 2022.   PD-L1 expression is 70%.    Molecular study showed no actionable mutation.  It has only MAP2K1   PRIOR THERAPY: 1) Status post robotic assisted left video thoracoscopy with emergency thoracotomy and left upper lobectomy and mediastinal lymph node sampling under the care of Dr. Cliffton Asters on November 15, 2021. 2) Adjuvant systemic chemotherapy with carboplatin for AUC of 6 and paclitaxel 200 Mg/M2 with Neulasta support every 3 weeks.  First dose January 08, 2022.  The patient was not eligible for treatment with cisplatin and Alimta because of renal insufficiency.  Status post 4 cycles.  Last dose was given on March 11, 2022.  CURRENT THERAPY: Adjuvant treatment with immunotherapy with atezolizumab 1200 Mg IV every 3 weeks.  First dose 04/23/2022.  Status post 16 cycles.   INTERVAL HISTORY: [name] 72 y.o. male returns to the clinic today for a follow-up visit.  The patient is currently undergoing treatment with adjuvant immunotherapy with Tecentriq.  He is status post 9 cycles.  Has been tolerating it fairly well.  He denies any new concerning complaints today other than  *** (weight gain?)***. The patient has stable fatigue and decreased exercise tolerance. He denies any fever or chills. He has some left-sided chest discomfort over the surgical site that comes and goes for which he is prescribed Robaxin and gabapentin.  He reports stable baseline dyspnea. Denies significant worsening cough except he had some cough yesterday due to phlegm feeling stuck. This exacerbated his post surgical rib pain.  Denies hemoptysis.  Denies any nausea or vomiting.  Denies any diarrhea or significant constipation at this time. Occasionally, he may have  constipation depending on what he eats.  He denies any headache or visual changes. He has some dry skin on his face for which he uses lotion. I previously gave him kenalog cream for his rash which has helped a lot.    He is here today for evaluation and repeat blood work before undergoing cycle #17      MEDICAL HISTORY: Past Medical History:  Diagnosis Date   Diabetes mellitus (HCC)    Dyslipidemia    Hypertension     ALLERGIES:  has No Known Allergies.  MEDICATIONS:  Current Outpatient Medications  Medication Sig Dispense Refill   atenolol (TENORMIN) 100 MG tablet Take 100 mg by mouth daily.     atorvastatin (LIPITOR) 20 MG tablet Take 20 mg by mouth daily.     famotidine (PEPCID) 40 MG tablet Take 40 mg by mouth daily.     gabapentin (NEURONTIN) 300 MG capsule Take 1 capsule (300 mg total) by mouth 3 (three) times daily. 270 capsule 1   losartan-hydrochlorothiazide (HYZAAR) 100-12.5 MG tablet Take 1 tablet by mouth daily.     metFORMIN (GLUCOPHAGE-XR) 500 MG 24 hr tablet Take 1,000 mg by mouth every evening.     methocarbamol (ROBAXIN) 500 MG tablet Take 1 tablet (500 mg total) by mouth 3 (three) times daily. 90 tablet 2   triamcinolone cream (KENALOG) 0.1 % Apply 1 Application topically 2 (two) times daily. 30 g 2   No current facility-administered medications for this visit.    SURGICAL HISTORY:  Past Surgical History:  Procedure Laterality Date   BRONCHIAL BIOPSY  11/15/2021   Procedure: BRONCHIAL BIOPSIES;  Surgeon: Audie Box  L, DO;  Location: [facility];  Service: Pulmonary;;   BRONCHIAL BRUSHINGS  11/15/2021   Procedure: BRONCHIAL BRUSHINGS;  Surgeon: Josephine Igo, DO;  Location: [facility];  Service: Pulmonary;;   FIDUCIAL MARKER PLACEMENT  11/15/2021   Procedure: FIDUCIAL DYE MARKING;  Surgeon: Josephine Igo, DO;  Location: [facility];  Service: Pulmonary;;   INGUINAL HERNIA REPAIR     INTERCOSTAL NERVE BLOCK Left 11/15/2021   Procedure: INTERCOSTAL  NERVE BLOCK;  Surgeon: Corliss Skains, MD;  Location: [facility] OR;  Service: Thoracic;  Laterality: Left;   LOBECTOMY Left 11/15/2021   Procedure: LEFT UPPER LOBECTOMY;  Surgeon: Corliss Skains, MD;  Location: [facility];  Service: Thoracic;  Laterality: Left;   MEDIASTINAL EXPLORATION Left 11/15/2021   Procedure: MEDIASTINAL LYMPH NODE EXPLORATION;  Surgeon: Corliss Skains, MD;  Location: [facility];  Service: Thoracic;  Laterality: Left;   NODE DISSECTION Left 11/15/2021   Procedure: NODE DISSECTION;  Surgeon: Corliss Skains, MD;  Location: [facility];  Service: Thoracic;  Laterality: Left;   RIB PLATING Left 11/15/2021   Procedure: RIB PLATING;  Surgeon: Corliss Skains, MD;  Location: [facility];  Service: Thoracic;  Laterality: Left;   THORACOTOMY/LOBECTOMY Left 11/15/2021   Procedure: EMERGENCY THORACOTOMY;  Surgeon: Corliss Skains, MD;  Location: [facility];  Service: Thoracic;  Laterality: Left;   VIDEO BRONCHOSCOPY WITH RADIAL ENDOBRONCHIAL ULTRASOUND  11/15/2021   Procedure: VIDEO BRONCHOSCOPY WITH RADIAL ENDOBRONCHIAL ULTRASOUND;  Surgeon: Josephine Igo, DO;  Location: [facility];  Service: Pulmonary;;    REVIEW OF SYSTEMS:   Review of Systems  Constitutional: Negative for appetite change, chills, fatigue, fever and unexpected weight change.  HENT:   Negative for mouth sores, nosebleeds, sore throat and trouble swallowing.   Eyes: Negative for eye problems and icterus.  Respiratory: Negative for cough, hemoptysis, shortness of breath and wheezing.   Cardiovascular: Negative for chest pain and leg swelling.  Gastrointestinal: Negative for abdominal pain, constipation, diarrhea, nausea and vomiting.  Genitourinary: Negative for bladder incontinence, difficulty urinating, dysuria, frequency and hematuria.   Musculoskeletal: Negative for back pain, gait problem, neck pain and neck stiffness.  Skin: Negative for itching and rash.  Neurological: Negative for dizziness, extremity  weakness, gait problem, headaches, light-headedness and seizures.  Hematological: Negative for adenopathy. Does not bruise/bleed easily.  Psychiatric/Behavioral: Negative for confusion, depression and sleep disturbance. The patient is not nervous/anxious.     PHYSICAL EXAMINATION:  There were no vitals taken for this visit.  ECOG PERFORMANCE STATUS: {CHL ONC ECOG [id number]  Physical Exam  Constitutional: Oriented to person, place, and time and well-developed, well-nourished, and in no distress. No distress.  HENT:  Head: Normocephalic and atraumatic.  Mouth/Throat: Oropharynx is clear and moist. No oropharyngeal exudate.  Eyes: Conjunctivae are normal. Right eye exhibits no discharge. Left eye exhibits no discharge. No scleral icterus.  Neck: Normal range of motion. Neck supple.  Cardiovascular: Normal rate, regular rhythm, normal heart sounds and intact distal pulses.   Pulmonary/Chest: Effort normal and breath sounds normal. No respiratory distress. No wheezes. No rales.  Abdominal: Soft. Bowel sounds are normal. Exhibits no distension and no mass. There is no tenderness.  Musculoskeletal: Normal range of motion. Exhibits no edema.  Lymphadenopathy:    No cervical adenopathy.  Neurological: Alert and oriented to person, place, and time. Exhibits normal muscle tone. Gait normal. Coordination normal.  Skin: Skin is warm and dry. No rash noted. Not diaphoretic. No erythema. No pallor.  Psychiatric: Mood, memory  and judgment normal.  Vitals reviewed.  LABORATORY DATA: Lab Results  Component Value Date   WBC 7.0 03/05/2023   HGB 12.1 (L) 03/05/2023   HCT 36.3 (L) 03/05/2023   MCV 84.0 03/05/2023   PLT 182 03/05/2023      Chemistry      Component Value Date/Time   NA 139 03/05/2023 0818   K 3.3 (L) 03/05/2023 0818   CL 103 03/05/2023 0818   CO2 24 03/05/2023 0818   BUN 23 03/05/2023 0818   CREATININE 1.34 (H) 03/05/2023 0818      Component Value Date/Time   CALCIUM  8.7 (L) 03/05/2023 0818   ALKPHOS 67 03/05/2023 0818   AST 23 03/05/2023 0818   ALT 19 03/05/2023 0818   BILITOT 0.4 03/05/2023 0818       RADIOGRAPHIC STUDIES:  No results found.   ASSESSMENT/PLAN:  This is  a very pleasant 72 year old Caucasian male diagnosed with stage IIIa (T1c, N2, M0) non-small cell lung cancer, adenocarcinoma.  The patient presented with a left upper lobe lung nodule in addition to mediastinal lymphadenopathy.  He was diagnosed in December 2022.  The patient's PD-L1 expression is 70%.  He has no actionable mutations.  The patient underwent a left upper lobectomy with lymph node dissection under the care of Dr. Cliffton AstersLightfoot.  The resection margins were negative for malignancy and there was no visceral pleural involvement but there was lymphovascular invasion.   Therefore the patient underwent adjuvant systemic chemotherapy with 4 cycles of carboplatin for an AUC of 6 and paclitaxel 200 mg per metered squared IV every 3 weeks with Neulasta support.  The patient is not a good candidate for cisplatin Alimta because of his renal insufficiency.  He completed this on 03/11/2022.  He tolerated this fairly well except for Neulasta associated arthralgias.    Because of his PD-L1 expression being greater than 70%, Dr. Arbutus PedMohamed recommended adjuvant treatment with immunotherapy with Tecentriq 1200 mg IV every 3 weeks.  The patient is status post  16 cycles.    Labs were reviewed. His creatinine is *** Recommend that he proceed with cycle 17 today as scheduled.   We will see him back for follow-up visit in 3 weeks for evaluation and repeat blood work before starting cycle #18, which will be his last cycle of treatment.   The patient was advised to call immediately if he has any concerning symptoms in the interval. The patient voices understanding of current disease status and treatment options and is in agreement with the current care plan. All questions were answered. The patient  knows to call the clinic with any problems, questions or concerns. We can certainly see the patient much sooner if necessary    No orders of the defined types were placed in this encounter.    I spent {CHL ONC TIME VISIT - [id number]}SWIFT:[id number]} counseling the patient face to face. The total time spent in the appointment was {CHL ONC TIME VISIT - [id number]}SWIFT:[id number]}.  Jong Rickman L Chaitra Mast, PA-C 03/25/23

## 2023-03-26 ENCOUNTER — Inpatient Hospital Stay: Payer: Medicare Other

## 2023-03-26 ENCOUNTER — Ambulatory Visit: Payer: Medicare Other | Admitting: Physician Assistant

## 2023-03-26 ENCOUNTER — Other Ambulatory Visit: Payer: Medicare Other

## 2023-03-26 ENCOUNTER — Inpatient Hospital Stay: Payer: Medicare Other | Admitting: Internal Medicine

## 2023-03-27 ENCOUNTER — Other Ambulatory Visit: Payer: Self-pay

## 2023-03-27 ENCOUNTER — Inpatient Hospital Stay: Payer: Medicare Other | Attending: Internal Medicine

## 2023-03-27 ENCOUNTER — Ambulatory Visit: Payer: Medicare Other

## 2023-03-27 ENCOUNTER — Inpatient Hospital Stay: Payer: Medicare Other

## 2023-03-27 ENCOUNTER — Inpatient Hospital Stay (HOSPITAL_BASED_OUTPATIENT_CLINIC_OR_DEPARTMENT_OTHER): Payer: Medicare Other | Admitting: Physician Assistant

## 2023-03-27 VITALS — BP 130/77 | HR 76 | Temp 98.3°F | Resp 16 | Wt 224.2 lb

## 2023-03-27 DIAGNOSIS — Z5112 Encounter for antineoplastic immunotherapy: Secondary | ICD-10-CM | POA: Insufficient documentation

## 2023-03-27 DIAGNOSIS — N289 Disorder of kidney and ureter, unspecified: Secondary | ICD-10-CM | POA: Insufficient documentation

## 2023-03-27 DIAGNOSIS — E119 Type 2 diabetes mellitus without complications: Secondary | ICD-10-CM | POA: Diagnosis not present

## 2023-03-27 DIAGNOSIS — Z79620 Long term (current) use of immunosuppressive biologic: Secondary | ICD-10-CM | POA: Diagnosis not present

## 2023-03-27 DIAGNOSIS — C3412 Malignant neoplasm of upper lobe, left bronchus or lung: Secondary | ICD-10-CM | POA: Insufficient documentation

## 2023-03-27 DIAGNOSIS — C3492 Malignant neoplasm of unspecified part of left bronchus or lung: Secondary | ICD-10-CM

## 2023-03-27 DIAGNOSIS — I10 Essential (primary) hypertension: Secondary | ICD-10-CM | POA: Insufficient documentation

## 2023-03-27 DIAGNOSIS — D229 Melanocytic nevi, unspecified: Secondary | ICD-10-CM

## 2023-03-27 LAB — CBC WITH DIFFERENTIAL (CANCER CENTER ONLY)
Abs Immature Granulocytes: 0.02 10*3/uL (ref 0.00–0.07)
Basophils Absolute: 0.1 10*3/uL (ref 0.0–0.1)
Basophils Relative: 1 %
Eosinophils Absolute: 0.4 10*3/uL (ref 0.0–0.5)
Eosinophils Relative: 4 %
HCT: 39.2 % (ref 39.0–52.0)
Hemoglobin: 13 g/dL (ref 13.0–17.0)
Immature Granulocytes: 0 %
Lymphocytes Relative: 49 %
Lymphs Abs: 4.4 10*3/uL — ABNORMAL HIGH (ref 0.7–4.0)
MCH: 28.4 pg (ref 26.0–34.0)
MCHC: 33.2 g/dL (ref 30.0–36.0)
MCV: 85.8 fL (ref 80.0–100.0)
Monocytes Absolute: 0.9 10*3/uL (ref 0.1–1.0)
Monocytes Relative: 10 %
Neutro Abs: 3.2 10*3/uL (ref 1.7–7.7)
Neutrophils Relative %: 36 %
Platelet Count: 215 10*3/uL (ref 150–400)
RBC: 4.57 MIL/uL (ref 4.22–5.81)
RDW: 15 % (ref 11.5–15.5)
Smear Review: NORMAL
WBC Count: 9 10*3/uL (ref 4.0–10.5)
nRBC: 0 % (ref 0.0–0.2)

## 2023-03-27 LAB — CMP (CANCER CENTER ONLY)
ALT: 24 U/L (ref 0–44)
AST: 30 U/L (ref 15–41)
Albumin: 4.4 g/dL (ref 3.5–5.0)
Alkaline Phosphatase: 76 U/L (ref 38–126)
Anion gap: 13 (ref 5–15)
BUN: 27 mg/dL — ABNORMAL HIGH (ref 8–23)
CO2: 23 mmol/L (ref 22–32)
Calcium: 9.5 mg/dL (ref 8.9–10.3)
Chloride: 101 mmol/L (ref 98–111)
Creatinine: 1.64 mg/dL — ABNORMAL HIGH (ref 0.61–1.24)
GFR, Estimated: 44 mL/min — ABNORMAL LOW (ref >60)
Glucose, Bld: 201 mg/dL — ABNORMAL HIGH (ref 70–99)
Potassium: 3.6 mmol/L (ref 3.5–5.1)
Sodium: 137 mmol/L (ref 135–145)
Total Bilirubin: 0.4 mg/dL (ref 0.3–1.2)
Total Protein: 7.8 g/dL (ref 6.5–8.1)

## 2023-03-27 LAB — TSH: TSH: 3.976 u[IU]/mL (ref 0.350–4.500)

## 2023-03-27 MED ORDER — SODIUM CHLORIDE 0.9 % IV SOLN
1200.0000 mg | Freq: Once | INTRAVENOUS | Status: AC
  Administered 2023-03-27: 1200 mg via INTRAVENOUS
  Filled 2023-03-27: qty 20

## 2023-03-27 MED ORDER — SODIUM CHLORIDE 0.9 % IV SOLN: Freq: Once | INTRAVENOUS | Status: AC

## 2023-03-27 NOTE — Patient Instructions (Signed)
[facility]  Discharge Instructions: Thank you for choosing [facility] to provide your oncology and hematology care.   If you have a lab appointment with the Cancer Center, please go directly to the Cancer Center and check in at the registration area.   Wear comfortable clothing and clothing appropriate for easy access to any Portacath or PICC line.   We strive to give you quality time with your provider. You may need to reschedule your appointment if you arrive late (15 or more minutes).  Arriving late affects you and other patients whose appointments are after yours.  Also, if you miss three or more appointments without notifying the office, you may be dismissed from the clinic at the provider's discretion.      For prescription refill requests, have your pharmacy contact our office and allow 72 hours for refills to be completed.    Today you received the following chemotherapy and/or immunotherapy agents tecentriq      To help prevent nausea and vomiting after your treatment, we encourage you to take your nausea medication as directed.  BELOW ARE SYMPTOMS THAT SHOULD BE REPORTED IMMEDIATELY: *FEVER GREATER THAN 100.4 F (38 C) OR HIGHER *CHILLS OR SWEATING *NAUSEA AND VOMITING THAT IS NOT CONTROLLED WITH YOUR NAUSEA MEDICATION *UNUSUAL SHORTNESS OF BREATH *UNUSUAL BRUISING OR BLEEDING *URINARY PROBLEMS (pain or burning when urinating, or frequent urination) *BOWEL PROBLEMS (unusual diarrhea, constipation, pain near the anus) TENDERNESS IN MOUTH AND THROAT WITH OR WITHOUT PRESENCE OF ULCERS (sore throat, sores in mouth, or a toothache) UNUSUAL RASH, SWELLING OR PAIN  UNUSUAL VAGINAL DISCHARGE OR ITCHING   Items with * indicate a potential emergency and should be followed up as soon as possible or go to the Emergency Department if any problems should occur.  Please show the CHEMOTHERAPY ALERT CARD or IMMUNOTHERAPY ALERT CARD at  check-in to the Emergency Department and triage nurse.  Should you have questions after your visit or need to cancel or reschedule your appointment, please contact [facility]  Dept: [phone]  and follow the prompts.  Office hours are 8:00 a.m. to 4:30 p.m. Monday - Friday. Please note that voicemails left after 4:00 p.m. may not be returned until the following business day.  We are closed weekends and major holidays. You have access to a nurse at all times for urgent questions. Please call the main number to the clinic Dept: [phone] and follow the prompts.   For any non-urgent questions, you may also contact your provider using MyChart. We now offer e-Visits for anyone 18 and older to request care online for non-urgent symptoms. For details visit [email or web address].   Also download the MyChart app! Go to the app store, search "MyChart", open the app, select [facility], and log in with your MyChart username and password.  

## 2023-03-27 NOTE — Progress Notes (Signed)
Patient seen by Cassie Heilingoetter, PA-C  Vitals are within treatment parameters.  Labs reviewed: and are within treatment parameters.  Per physician team, patient is ready for treatment and there are NO modifications to the treatment plan.  

## 2023-03-28 LAB — T4: T4, Total: 6.5 ug/dL (ref 4.5–12.0)

## 2023-04-16 ENCOUNTER — Encounter: Payer: Self-pay | Admitting: Medical Oncology

## 2023-04-16 ENCOUNTER — Other Ambulatory Visit: Payer: Medicare Other

## 2023-04-16 ENCOUNTER — Inpatient Hospital Stay (HOSPITAL_BASED_OUTPATIENT_CLINIC_OR_DEPARTMENT_OTHER): Payer: Medicare Other | Admitting: Internal Medicine

## 2023-04-16 ENCOUNTER — Ambulatory Visit: Payer: Medicare Other

## 2023-04-16 ENCOUNTER — Inpatient Hospital Stay: Payer: Medicare Other

## 2023-04-16 ENCOUNTER — Inpatient Hospital Stay: Payer: Medicare Other | Attending: Internal Medicine

## 2023-04-16 ENCOUNTER — Ambulatory Visit: Payer: Medicare Other | Admitting: Internal Medicine

## 2023-04-16 VITALS — BP 132/84 | HR 69 | Resp 18

## 2023-04-16 VITALS — BP 151/81 | HR 63 | Temp 97.2°F | Resp 13 | Wt 229.5 lb

## 2023-04-16 DIAGNOSIS — M40204 Unspecified kyphosis, thoracic region: Secondary | ICD-10-CM | POA: Insufficient documentation

## 2023-04-16 DIAGNOSIS — C3492 Malignant neoplasm of unspecified part of left bronchus or lung: Secondary | ICD-10-CM

## 2023-04-16 DIAGNOSIS — M47814 Spondylosis without myelopathy or radiculopathy, thoracic region: Secondary | ICD-10-CM | POA: Insufficient documentation

## 2023-04-16 DIAGNOSIS — I700 Atherosclerosis of aorta: Secondary | ICD-10-CM | POA: Insufficient documentation

## 2023-04-16 DIAGNOSIS — C3412 Malignant neoplasm of upper lobe, left bronchus or lung: Secondary | ICD-10-CM | POA: Diagnosis present

## 2023-04-16 DIAGNOSIS — Z902 Acquired absence of lung [part of]: Secondary | ICD-10-CM | POA: Insufficient documentation

## 2023-04-16 DIAGNOSIS — I2510 Atherosclerotic heart disease of native coronary artery without angina pectoris: Secondary | ICD-10-CM | POA: Insufficient documentation

## 2023-04-16 DIAGNOSIS — R0609 Other forms of dyspnea: Secondary | ICD-10-CM | POA: Insufficient documentation

## 2023-04-16 DIAGNOSIS — Z79620 Long term (current) use of immunosuppressive biologic: Secondary | ICD-10-CM | POA: Insufficient documentation

## 2023-04-16 DIAGNOSIS — E1140 Type 2 diabetes mellitus with diabetic neuropathy, unspecified: Secondary | ICD-10-CM | POA: Diagnosis not present

## 2023-04-16 DIAGNOSIS — K760 Fatty (change of) liver, not elsewhere classified: Secondary | ICD-10-CM | POA: Diagnosis not present

## 2023-04-16 DIAGNOSIS — Z5112 Encounter for antineoplastic immunotherapy: Secondary | ICD-10-CM | POA: Insufficient documentation

## 2023-04-16 DIAGNOSIS — C3490 Malignant neoplasm of unspecified part of unspecified bronchus or lung: Secondary | ICD-10-CM | POA: Diagnosis not present

## 2023-04-16 DIAGNOSIS — N289 Disorder of kidney and ureter, unspecified: Secondary | ICD-10-CM | POA: Insufficient documentation

## 2023-04-16 LAB — CBC WITH DIFFERENTIAL (CANCER CENTER ONLY)
Abs Immature Granulocytes: 0.06 10*3/uL (ref 0.00–0.07)
Basophils Absolute: 0.1 10*3/uL (ref 0.0–0.1)
Basophils Relative: 1 %
Eosinophils Absolute: 0.3 10*3/uL (ref 0.0–0.5)
Eosinophils Relative: 4 %
HCT: 38.5 % — ABNORMAL LOW (ref 39.0–52.0)
Hemoglobin: 12.9 g/dL — ABNORMAL LOW (ref 13.0–17.0)
Immature Granulocytes: 1 %
Lymphocytes Relative: 40 %
Lymphs Abs: 3 10*3/uL (ref 0.7–4.0)
MCH: 29.1 pg (ref 26.0–34.0)
MCHC: 33.5 g/dL (ref 30.0–36.0)
MCV: 86.7 fL (ref 80.0–100.0)
Monocytes Absolute: 0.7 10*3/uL (ref 0.1–1.0)
Monocytes Relative: 9 %
Neutro Abs: 3.3 10*3/uL (ref 1.7–7.7)
Neutrophils Relative %: 45 %
Platelet Count: 183 10*3/uL (ref 150–400)
RBC: 4.44 MIL/uL (ref 4.22–5.81)
RDW: 14.6 % (ref 11.5–15.5)
Smear Review: NORMAL
WBC Count: 7.4 10*3/uL (ref 4.0–10.5)
nRBC: 0 % (ref 0.0–0.2)

## 2023-04-16 LAB — CMP (CANCER CENTER ONLY)
ALT: 19 U/L (ref 0–44)
AST: 25 U/L (ref 15–41)
Albumin: 4.1 g/dL (ref 3.5–5.0)
Alkaline Phosphatase: 72 U/L (ref 38–126)
Anion gap: 11 (ref 5–15)
BUN: 20 mg/dL (ref 8–23)
CO2: 24 mmol/L (ref 22–32)
Calcium: 9 mg/dL (ref 8.9–10.3)
Chloride: 102 mmol/L (ref 98–111)
Creatinine: 1.35 mg/dL — ABNORMAL HIGH (ref 0.61–1.24)
GFR, Estimated: 56 mL/min — ABNORMAL LOW (ref >60)
Glucose, Bld: 207 mg/dL — ABNORMAL HIGH (ref 70–99)
Potassium: 3.6 mmol/L (ref 3.5–5.1)
Sodium: 137 mmol/L (ref 135–145)
Total Bilirubin: 0.6 mg/dL (ref 0.3–1.2)
Total Protein: 7.2 g/dL (ref 6.5–8.1)

## 2023-04-16 LAB — TSH: TSH: 4.407 u[IU]/mL (ref 0.350–4.500)

## 2023-04-16 MED ORDER — SODIUM CHLORIDE 0.9 % IV SOLN
1200.0000 mg | Freq: Once | INTRAVENOUS | Status: AC
  Administered 2023-04-16: 1200 mg via INTRAVENOUS
  Filled 2023-04-16: qty 20

## 2023-04-16 MED ORDER — SODIUM CHLORIDE 0.9 % IV SOLN: Freq: Once | INTRAVENOUS | Status: AC

## 2023-04-16 NOTE — Progress Notes (Signed)
[facility] [phone]) [phone]   [phone]  OFFICE PROGRESS NOTE  Si Gaul, MD [address] [place] [address]  DIAGNOSIS: Stage IIIA (T1c, N2, M0) non-small cell lung cancer, moderately differentiated adenocarcinoma diagnosed in December 2022.  PD-L1 expression is 70%.    Molecular study showed no actionable mutation.  It has only MAP2K1  PRIOR THERAPY:  1) Status post robotic assisted left video thoracoscopy with emergency thoracotomy and left upper lobectomy and mediastinal lymph node sampling under the care of Dr. Cliffton Asters on November 15, 2021. 2) Adjuvant systemic chemotherapy with carboplatin for AUC of 6 and paclitaxel 200 Mg/M2 with Neulasta support every 3 weeks.  First dose January 08, 2022.  The patient was not eligible for treatment with cisplatin and Alimta because of renal insufficiency.  Status post 4 cycles.  Last dose was given on March 11, 2022.  CURRENT THERAPY: Adjuvant treatment with immunotherapy with atezolizumab 1200 Mg IV every 3 weeks.  First dose 04/23/2022.  Status post 17 cycles.  INTERVAL HISTORY: [name] 72 y.o. male returns to the clinic today for follow-up visit.  The patient is feeling fine today with no concerning complaints.  He denied having any chest pain, shortness of breath, cough or hemoptysis.  He has no nausea, vomiting, diarrhea or constipation.  He has no headache or visual changes.  He has no recent weight loss or night sweats.  He is here today for evaluation before starting the last cycle of his treatment.  MEDICAL HISTORY: Past Medical History:  Diagnosis Date   Diabetes mellitus (HCC)    Dyslipidemia    Hypertension     ALLERGIES:  has No Known Allergies.  MEDICATIONS:  Current Outpatient Medications  Medication Sig Dispense Refill   atenolol (TENORMIN) 100 MG tablet Take 100 mg by mouth daily.     atorvastatin (LIPITOR) 20 MG tablet Take 20 mg by mouth daily.      famotidine (PEPCID) 40 MG tablet Take 40 mg by mouth daily.     gabapentin (NEURONTIN) 300 MG capsule Take 1 capsule (300 mg total) by mouth 3 (three) times daily. 270 capsule 1   losartan-hydrochlorothiazide (HYZAAR) 100-12.5 MG tablet Take 1 tablet by mouth daily.     metFORMIN (GLUCOPHAGE-XR) 500 MG 24 hr tablet Take 1,000 mg by mouth every evening.     methocarbamol (ROBAXIN) 500 MG tablet Take 1 tablet (500 mg total) by mouth 3 (three) times daily. 90 tablet 2   triamcinolone cream (KENALOG) 0.1 % Apply 1 Application topically 2 (two) times daily. 30 g 2   No current facility-administered medications for this visit.    SURGICAL HISTORY:  Past Surgical History:  Procedure Laterality Date   BRONCHIAL BIOPSY  11/15/2021   Procedure: BRONCHIAL BIOPSIES;  Surgeon: Josephine Igo, DO;  Location: [facility];  Service: Pulmonary;;   BRONCHIAL BRUSHINGS  11/15/2021   Procedure: BRONCHIAL BRUSHINGS;  Surgeon: Josephine Igo, DO;  Location: [facility];  Service: Pulmonary;;   FIDUCIAL MARKER PLACEMENT  11/15/2021   Procedure: FIDUCIAL DYE MARKING;  Surgeon: Josephine Igo, DO;  Location: [facility];  Service: Pulmonary;;   INGUINAL HERNIA REPAIR     INTERCOSTAL NERVE BLOCK Left 11/15/2021   Procedure: INTERCOSTAL NERVE BLOCK;  Surgeon: Corliss Skains, MD;  Location: [facility];  Service: Thoracic;  Laterality: Left;   LOBECTOMY Left 11/15/2021   Procedure: LEFT UPPER LOBECTOMY;  Surgeon: Corliss Skains, MD;  Location: [facility];  Service: Thoracic;  Laterality: Left;   MEDIASTINAL EXPLORATION Left 11/15/2021   Procedure: MEDIASTINAL LYMPH NODE EXPLORATION;  Surgeon: Corliss Skains, MD;  Location: [facility];  Service: Thoracic;  Laterality: Left;   NODE DISSECTION Left 11/15/2021   Procedure: NODE DISSECTION;  Surgeon: Corliss Skains, MD;  Location: [facility];  Service: Thoracic;  Laterality: Left;   RIB PLATING Left 11/15/2021   Procedure: RIB PLATING;  Surgeon: Corliss Skains, MD;  Location: [facility];  Service: Thoracic;  Laterality: Left;   THORACOTOMY/LOBECTOMY Left 11/15/2021   Procedure: EMERGENCY THORACOTOMY;  Surgeon: Corliss Skains, MD;  Location: [facility];  Service: Thoracic;  Laterality: Left;   VIDEO BRONCHOSCOPY WITH RADIAL ENDOBRONCHIAL ULTRASOUND  11/15/2021   Procedure: VIDEO BRONCHOSCOPY WITH RADIAL ENDOBRONCHIAL ULTRASOUND;  Surgeon: Josephine Igo, DO;  Location: [facility];  Service: Pulmonary;;    REVIEW OF SYSTEMS:  A comprehensive review of systems was negative.   PHYSICAL EXAMINATION: General appearance: alert, cooperative, and no distress Head: Normocephalic, without obvious abnormality, atraumatic Neck: no adenopathy, no JVD, supple, symmetrical, trachea midline, and thyroid not enlarged, symmetric, no tenderness/mass/nodules Lymph nodes: Cervical, supraclavicular, and axillary nodes normal. Resp: clear to auscultation bilaterally Back: symmetric, no curvature. ROM normal. No CVA tenderness. Cardio: regular rate and rhythm, S1, S2 normal, no murmur, click, rub or gallop GI: soft, non-tender; bowel sounds normal; no masses,  no organomegaly Extremities: extremities normal, atraumatic, no cyanosis or edema  ECOG PERFORMANCE STATUS: 1 - Symptomatic but completely ambulatory  Blood pressure (!) 151/81, pulse 63, temperature (!) 97.2 F (36.2 C), temperature source Temporal, resp. rate 13, weight 229 lb 8 oz (104.1 kg), SpO2 96 %.  LABORATORY DATA: Lab Results  Component Value Date   WBC 9.0 03/27/2023   HGB 13.0 03/27/2023   HCT 39.2 03/27/2023   MCV 85.8 03/27/2023   PLT 215 03/27/2023      Chemistry      Component Value Date/Time   NA 137 03/27/2023 0724   K 3.6 03/27/2023 0724   CL 101 03/27/2023 0724   CO2 23 03/27/2023 0724   BUN 27 (H) 03/27/2023 0724   CREATININE 1.64 (H) 03/27/2023 0724      Component Value Date/Time   CALCIUM 9.5 03/27/2023 0724   ALKPHOS 76 03/27/2023 0724   AST 30 03/27/2023 0724    ALT 24 03/27/2023 0724   BILITOT 0.4 03/27/2023 0724       RADIOGRAPHIC STUDIES: No results found.  ASSESSMENT AND PLAN: This is a very pleasant 72 years old white male diagnosed with a stage IIIa (T1c, N2, M0) non-small cell lung cancer, adenocarcinoma presented with left upper lobe lung nodule in addition to mediastinal lymphadenopathy diagnosed in December 2022.  The patient is status post left upper lobectomy with lymph node dissection.  The resection margins were negative for malignancy and there was no visceral pleural involvement but lymphovascular invasion. The patient has no actionable mutations and PD-L1 expression was 70% He completed 4 cycles of adjuvant systemic chemotherapy with carboplatin and paclitaxel.  He was not a good candidate for treatment with Alimta because of renal insufficiency. The patient is currently undergoing adjuvant treatment with immunotherapy with atezolizumab 1200 Mg IV every 3 weeks status post 17 cycles.   The patient has been tolerating this treatment well with no concerning adverse effects. I recommended for him to proceed with the last cycle of his treatment today as planned. I will see him back for follow-up visit and 1 months for  evaluation and repeat CT scan of the chest for restaging of his disease. For the neuropathy and muscle spasm, he will continue his treatment with gabapentin and Robaxin. The patient was advised to call immediately if he has any other concerning symptoms in the interval. The patient voices understanding of current disease status and treatment options and is in agreement with the current care plan.  All questions were answered. The patient knows to call the clinic with any problems, questions or concerns. We can certainly see the patient much sooner if necessary.   Disclaimer: This note was dictated with voice recognition software. Similar sounding words can inadvertently be transcribed and may not be corrected upon  review.

## 2023-04-16 NOTE — Progress Notes (Signed)
Patient seen by Dr. Mohamed  Vitals are within treatment parameters.  Labs reviewed: and are within treatment parameters.  Per physician team, patient is ready for treatment and there are NO modifications to the treatment plan.  

## 2023-04-16 NOTE — Progress Notes (Signed)
Referral ,demographics and recent note faxed to Dr. Nolon Stalls , dermatology .

## 2023-04-16 NOTE — Patient Instructions (Signed)
[facility]  Discharge Instructions: Thank you for choosing [facility] to provide your oncology and hematology care.   If you have a lab appointment with the Cancer Center, please go directly to the Cancer Center and check in at the registration area.   Wear comfortable clothing and clothing appropriate for easy access to any Portacath or PICC line.   We strive to give you quality time with your provider. You may need to reschedule your appointment if you arrive late (15 or more minutes).  Arriving late affects you and other patients whose appointments are after yours.  Also, if you miss three or more appointments without notifying the office, you may be dismissed from the clinic at the provider's discretion.      For prescription refill requests, have your pharmacy contact our office and allow 72 hours for refills to be completed.    Today you received the following chemotherapy and/or immunotherapy agents Tecentriq      To help prevent nausea and vomiting after your treatment, we encourage you to take your nausea medication as directed.  BELOW ARE SYMPTOMS THAT SHOULD BE REPORTED IMMEDIATELY: *FEVER GREATER THAN 100.4 F (38 C) OR HIGHER *CHILLS OR SWEATING *NAUSEA AND VOMITING THAT IS NOT CONTROLLED WITH YOUR NAUSEA MEDICATION *UNUSUAL SHORTNESS OF BREATH *UNUSUAL BRUISING OR BLEEDING *URINARY PROBLEMS (pain or burning when urinating, or frequent urination) *BOWEL PROBLEMS (unusual diarrhea, constipation, pain near the anus) TENDERNESS IN MOUTH AND THROAT WITH OR WITHOUT PRESENCE OF ULCERS (sore throat, sores in mouth, or a toothache) UNUSUAL RASH, SWELLING OR PAIN  UNUSUAL VAGINAL DISCHARGE OR ITCHING   Items with * indicate a potential emergency and should be followed up as soon as possible or go to the Emergency Department if any problems should occur.  Please show the CHEMOTHERAPY ALERT CARD or IMMUNOTHERAPY ALERT CARD at  check-in to the Emergency Department and triage nurse.  Should you have questions after your visit or need to cancel or reschedule your appointment, please contact [facility]  Dept: [phone]  and follow the prompts.  Office hours are 8:00 a.m. to 4:30 p.m. Monday - Friday. Please note that voicemails left after 4:00 p.m. may not be returned until the following business day.  We are closed weekends and major holidays. You have access to a nurse at all times for urgent questions. Please call the main number to the clinic Dept: [phone] and follow the prompts.   For any non-urgent questions, you may also contact your provider using MyChart. We now offer e-Visits for anyone 18 and older to request care online for non-urgent symptoms. For details visit [email or web address].   Also download the MyChart app! Go to the app store, search "MyChart", open the app, select , and log in with your MyChart username and password. 

## 2023-04-17 ENCOUNTER — Telehealth: Payer: Self-pay | Admitting: Physician Assistant

## 2023-04-17 ENCOUNTER — Telehealth: Payer: Self-pay | Admitting: Internal Medicine

## 2023-04-17 ENCOUNTER — Other Ambulatory Visit: Payer: Self-pay

## 2023-04-17 NOTE — Telephone Encounter (Signed)
Scheduled per 04/30 los, patient has been called and notified of upcoming appointments.

## 2023-04-17 NOTE — Telephone Encounter (Signed)
Patient called, left voicemail he wants to change his appointments, reached out to patient to reschedule left voicemai

## 2023-04-18 LAB — T4: T4, Total: 5.7 ug/dL (ref 4.5–12.0)

## 2023-04-22 ENCOUNTER — Telehealth: Payer: Self-pay | Admitting: Medical Oncology

## 2023-04-22 NOTE — Telephone Encounter (Signed)
Faxed referral information to Dr Nita Sells.

## 2023-04-25 ENCOUNTER — Telehealth: Payer: Self-pay | Admitting: Medical Oncology

## 2023-04-25 NOTE — Telephone Encounter (Signed)
Pt is next on the list to be scheduled.

## 2023-05-10 NOTE — Progress Notes (Unsigned)
[facility] OFFICE PROGRESS NOTE  Si Gaul, MD [address] [place] [address]  DIAGNOSIS: Stage IIIA (T1c, N2, M0) non-small cell lung cancer, moderately differentiated adenocarcinoma diagnosed in December 2022.   PD-L1 expression is 70%.    Molecular study showed no actionable mutation.  It has only MAP2K1   PRIOR THERAPY: 1) Status post robotic assisted left video thoracoscopy with emergency thoracotomy and left upper lobectomy and mediastinal lymph node sampling under the care of Dr. Cliffton Asters on November 15, 2021. 2) Adjuvant systemic chemotherapy with carboplatin for AUC of 6 and paclitaxel 200 Mg/M2 with Neulasta support every 3 weeks.  First dose January 08, 2022.  The patient was not eligible for treatment with cisplatin and Alimta because of renal insufficiency.  Status post 4 cycles.  Last dose was given on March 11, 2022. 3) Adjuvant treatment with immunotherapy with atezolizumab 1200 Mg IV every 3 weeks. Last dose 04/16/23.  Status post 17 cycles.   CURRENT THERAPY: Observation   INTERVAL HISTORY: [name] 72 y.o. male returns to the clinic today for a follow-up visit.  The patient just completed one year of adjuvant immunotherapy with Tecentriq. Has been tolerating it fairly well.  He denies any new concerning complaints today except for he continues to have baseline fatigue and dyspnea on exertion. He denies any fever or chills. He may have intermittent cough but nothing consistent. Denies hemoptysis.  Denies any nausea or vomiting.  Denies any diarrhea or significant constipation at this time. He denies any headache or visual changes. He denies arm pani or swelling. He was referred to dermatology a few months ago but they reportedly do not have availability for over a year. He is wondering if there is a different practice that he can go to. He recently had a restaging CT scan. He is here for evaluation and to review his scan and to  discuss the next steps in his care.    MEDICAL HISTORY: Past Medical History:  Diagnosis Date   Diabetes mellitus (HCC)    Dyslipidemia    Hypertension     ALLERGIES:  has No Known Allergies.  MEDICATIONS:  Current Outpatient Medications  Medication Sig Dispense Refill   atenolol (TENORMIN) 100 MG tablet Take 100 mg by mouth daily.     atorvastatin (LIPITOR) 20 MG tablet Take 20 mg by mouth daily.     famotidine (PEPCID) 40 MG tablet Take 40 mg by mouth daily.     gabapentin (NEURONTIN) 300 MG capsule Take 1 capsule (300 mg total) by mouth 3 (three) times daily. 270 capsule 1   losartan-hydrochlorothiazide (HYZAAR) 100-12.5 MG tablet Take 1 tablet by mouth daily.     metFORMIN (GLUCOPHAGE-XR) 500 MG 24 hr tablet Take 1,000 mg by mouth every evening.     methocarbamol (ROBAXIN) 500 MG tablet Take 1 tablet (500 mg total) by mouth 3 (three) times daily. 90 tablet 2   triamcinolone cream (KENALOG) 0.1 % Apply 1 Application topically 2 (two) times daily. 30 g 2   No current facility-administered medications for this visit.    SURGICAL HISTORY:  Past Surgical History:  Procedure Laterality Date   BRONCHIAL BIOPSY  11/15/2021   Procedure: BRONCHIAL BIOPSIES;  Surgeon: Josephine Igo, DO;  Location: [facility];  Service: Pulmonary;;   BRONCHIAL BRUSHINGS  11/15/2021   Procedure: BRONCHIAL BRUSHINGS;  Surgeon: Josephine Igo, DO;  Location: [facility];  Service: Pulmonary;;   FIDUCIAL MARKER PLACEMENT  11/15/2021   Procedure:  FIDUCIAL DYE MARKING;  Surgeon: Josephine Igo, DO;  Location: [facility];  Service: Pulmonary;;   INGUINAL HERNIA REPAIR     INTERCOSTAL NERVE BLOCK Left 11/15/2021   Procedure: INTERCOSTAL NERVE BLOCK;  Surgeon: Corliss Skains, MD;  Location: [facility] OR;  Service: Thoracic;  Laterality: Left;   LOBECTOMY Left 11/15/2021   Procedure: LEFT UPPER LOBECTOMY;  Surgeon: Corliss Skains, MD;  Location: [facility];  Service: Thoracic;  Laterality: Left;    MEDIASTINAL EXPLORATION Left 11/15/2021   Procedure: MEDIASTINAL LYMPH NODE EXPLORATION;  Surgeon: Corliss Skains, MD;  Location: [facility];  Service: Thoracic;  Laterality: Left;   NODE DISSECTION Left 11/15/2021   Procedure: NODE DISSECTION;  Surgeon: Corliss Skains, MD;  Location: [facility];  Service: Thoracic;  Laterality: Left;   RIB PLATING Left 11/15/2021   Procedure: RIB PLATING;  Surgeon: Corliss Skains, MD;  Location: [facility];  Service: Thoracic;  Laterality: Left;   THORACOTOMY/LOBECTOMY Left 11/15/2021   Procedure: EMERGENCY THORACOTOMY;  Surgeon: Corliss Skains, MD;  Location: [facility];  Service: Thoracic;  Laterality: Left;   VIDEO BRONCHOSCOPY WITH RADIAL ENDOBRONCHIAL ULTRASOUND  11/15/2021   Procedure: VIDEO BRONCHOSCOPY WITH RADIAL ENDOBRONCHIAL ULTRASOUND;  Surgeon: Josephine Igo, DO;  Location: [facility];  Service: Pulmonary;;    REVIEW OF SYSTEMS:   Constitutional: Negative for appetite change, chills, fever and unexpected weight change. fatigue HENT:   Negative for mouth sores, nosebleeds, sore throat and trouble swallowing.   Eyes: Negative for eye problems and icterus.  Respiratory: Negative for hemoptysis, and wheezing.  Stable Shortness of breath.  Cardiovascular: Negative for chest pain and leg swelling.  Gastrointestinal: Negative for abdominal pain, constipation, diarrhea, nausea and vomiting.  Genitourinary: Negative for bladder incontinence, difficulty urinating, dysuria, frequency and hematuria.   Musculoskeletal: Negative for back pain, gait problem, neck pain and neck stiffness.  Skin: Improving rash. Skin lesion/mole on left temple Neurological: Negative for dizziness, extremity weakness, gait problem, headaches, light-headedness and seizures.  Hematological: Negative for adenopathy. Does not bruise/bleed easily.  Psychiatric/Behavioral: Negative for confusion, depression and sleep disturbance. The patient is not nervous/anxious.    PHYSICAL  EXAMINATION:  Blood pressure (!) 146/73, pulse 67, temperature 97.9 F (36.6 C), temperature source Oral, resp. rate 17, weight 225 lb 8 oz (102.3 kg), SpO2 98 %.  ECOG PERFORMANCE STATUS: 1  Physical Exam  Constitutional: Oriented to person, place, and time and well-developed, well-nourished, and in no distress.  HENT:  Head: Normocephalic and atraumatic.  Mouth/Throat: Oropharynx is clear and moist. No oropharyngeal exudate.  Eyes: Conjunctivae are normal. Right eye exhibits no discharge. Left eye exhibits no discharge. No scleral icterus.  Neck: Normal range of motion. Neck supple.  Cardiovascular: Normal rate, regular rhythm, normal heart sounds and intact distal pulses.   Pulmonary/Chest: Effort normal and breath sounds normal. No respiratory distress. No wheezes. No rales.  Abdominal: Soft. Bowel sounds are normal. Exhibits no distension and no mass. There is no tenderness.  Musculoskeletal: Normal range of motion. Exhibits no edema.  Lymphadenopathy:    No cervical adenopathy.  Neurological: Alert and oriented to person, place, and time. Exhibits normal muscle tone. Gait normal. Coordination normal.  Skin: Raised, well circumscribed, flesh colored mole. Patient believes enlarging. Skin is warm and dry. He has some other scattered skin lesions secondary to immunotherapy which is improved compared to prior. Not diaphoretic. No erythema. No pallor.  Psychiatric: Mood, memory and judgment normal.  Vitals reviewed.  LABORATORY DATA: Lab Results  Component Value Date   WBC 8.3 05/14/2023   HGB 12.9 (L) 05/14/2023   HCT 38.8 (L) 05/14/2023   MCV 86.0 05/14/2023   PLT 206 05/14/2023      Chemistry      Component Value Date/Time   NA 135 05/14/2023 0746   K 3.7 05/14/2023 0746   CL 101 05/14/2023 0746   CO2 22 05/14/2023 0746   BUN 23 05/14/2023 0746   CREATININE 1.35 (H) 05/14/2023 0746      Component Value Date/Time   CALCIUM 9.4 05/14/2023 0746   ALKPHOS 82 05/14/2023  0746   AST 30 05/14/2023 0746   ALT 27 05/14/2023 0746   BILITOT 0.4 05/14/2023 0746       RADIOGRAPHIC STUDIES:  CT Chest W Contrast  Result Date: 05/14/2023 CLINICAL DATA:  Non-small cell lung cancer restaging * Tracking Code: BO * EXAM: CT CHEST WITH CONTRAST TECHNIQUE: Multidetector CT imaging of the chest was performed during intravenous contrast administration. RADIATION DOSE REDUCTION: This exam was performed according to the departmental dose-optimization program which includes automated exposure control, adjustment of the mA and/or kV according to patient size and/or use of iterative reconstruction technique. CONTRAST:  75mL OMNIPAQUE IOHEXOL 300 MG/ML  SOLN COMPARISON:  01/20/2023 FINDINGS: Cardiovascular: Narrowed appearance of the right subclavian artery along the scalene musculature, with some collateralization of contrast from the right upper extremity injection along the chest wall. This is a similar appearance to prior exam. Coronary, aortic arch, and branch vessel atherosclerotic vascular disease. Mediastinum/Nodes: No pathologic adenopathy. Lungs/Pleura: Left upper lobectomy with associated left hemithoracic volume loss. Bandlike atelectasis noted medially in the right lower lobe although improved from 01/20/2023. Likely calcified 2 mm left lower lobe nodule on image 91 series 8, unchanged. Upper Abdomen: Hepatic steatosis. Sparing along the gallbladder fossa. No other significant findings. Musculoskeletal: Malleable plates noted along the left fourth and fifth ribs. Old healed left rib fractures. Thoracic kyphosis and spondylosis. IMPRESSION: 1. No findings of recurrent malignancy. 2. Stable postoperative findings of left upper lobectomy. 3. Hepatic steatosis. 4. Thoracic kyphosis and spondylosis. 5. Narrowed appearance of the right subclavian artery along the scalene musculature, with associated venous collateralization. If the patient experiences intermittent right upper extremity  swelling and discoloration then the possibility of venous thoracic outlet syndrome/McCleery syndrome might be considered. 6. 2 mm left lower lobe nodule is likely calcified and unchanged. 7. Aortic and coronary atherosclerosis. Aortic Atherosclerosis (ICD10-I70.0). Electronically Signed   By: Gaylyn Rong M.D.   On: 05/14/2023 18:30     ASSESSMENT/PLAN:  This is  a very pleasant 72 year old Caucasian male diagnosed with stage IIIa (T1c, N2, M0) non-small cell lung cancer, adenocarcinoma.  The patient presented with a left upper lobe lung nodule in addition to mediastinal lymphadenopathy.  He was diagnosed in December 2022.  The patient's PD-L1 expression is 70%.  He has no actionable mutations.  The patient underwent a left upper lobectomy with lymph node dissection under the care of Dr. Cliffton Asters.  The resection margins were negative for malignancy and there was no visceral pleural involvement but there was lymphovascular invasion.    Therefore the patient underwent adjuvant systemic chemotherapy with 4 cycles of carboplatin for an AUC of 6 and paclitaxel 200 mg per metered squared IV every 3 weeks with Neulasta support.  The patient is not a good candidate for cisplatin Alimta because of his renal insufficiency.  He completed this on 03/11/2022.  He tolerated this fairly well except for Neulasta associated arthralgias.  Because of his PD-L1 expression being greater than 70%, Dr. Arbutus Ped recommended adjuvant treatment with immunotherapy with Tecentriq 1200 mg IV every 3 weeks. He completed 1 year of treatment on 04/16/23  The patient recently had a restaging CT scan performed. The patient was seen with Dr. Arbutus Ped. Dr. Arbutus Ped personally and independnetly reviewed the scan and discussed the results with the patient. The scan showed no evidence of disease progression.   Dr. Arbutus Ped recommends he continue on observation with a restaging CT scan in 4 months. If that is stable at that time, we will  consider seeing him agani in 6 months after that with a repeat CT scan.   We will see him back at that time to review the scan   We will look into other dermatology practices as the patient is upset that the wait list is over 1 year.   I have refilled his robaxin and gabapentin.   For his fatigue, encouraged him to increase his activity and exercise tolerance as he is likely deconditioned.   The scan had some incidental findings with narrowed appearance of the right subclavian artery along the scalene musculature. The patient denies any extremity pain or swelling. I reviewed this findings so he is aware in the event he develops symptoms in the future.   The patient was advised to call immediately if he has any concerning symptoms in the interval. The patient voices understanding of current disease status and treatment options and is in agreement with the current care plan. All questions were answered. The patient knows to call the clinic with any problems, questions or concerns. We can certainly see the patient much sooner if necessary       Orders Placed This Encounter  Procedures   CT Chest W Contrast    Standing Status:   Future    Standing Expiration Date:   05/14/2024    Order Specific Question:   If indicated for the ordered procedure, I authorize the administration of contrast media per Radiology protocol    Answer:   Yes    Order Specific Question:   Does the patient have a contrast media/X-ray dye allergy?    Answer:   No    Order Specific Question:   Preferred imaging location?    Answer:   [facility]   CBC with Differential (Cancer Center Only)    Standing Status:   Future    Standing Expiration Date:   05/14/2024   CMP (Cancer Center only)    Standing Status:   Future    Standing Expiration Date:   05/14/2024      Johnette Abraham Najia Hurlbutt, PA-C 05/15/23  ADDENDUM: Hematology/Oncology Attending: I had a face-to-face encounter with the patient today.  I  reviewed his record, lab, scan and recommended his care plan.  This is a very pleasant 72 years old white male with a stage IIIa non-small cell lung cancer, adenocarcinoma with PD-L1 expression of 70% and no actionable mutations.  The patient is status post left upper lobectomy with mediastinal lymph node sampling under the care of Dr. Cliffton Asters in December 2022 followed by adjuvant systemic chemotherapy with carboplatin and paclitaxel for 4 cycles followed by adjuvant immunotherapy with atezolizumab for 1 year completed on Apr 16, 2023. The patient had repeat CT scan of the chest performed recently.  I personally and independently reviewed the scan with the patient today. His scan showed no concerning findings for disease recurrence or metastasis. I recommended for the patient to continue  on observation with repeat CT scan of the chest in 4 months for restaging of his disease. The patient was advised to call immediately if he has any concerning symptoms in the interval. The total time spent in the appointment was 30 minutes. Disclaimer: This note was dictated with voice recognition software. Similar sounding words can inadvertently be transcribed and may be missed upon review. Lajuana Matte, MD

## 2023-05-13 ENCOUNTER — Other Ambulatory Visit: Payer: Medicare Other

## 2023-05-14 ENCOUNTER — Ambulatory Visit (HOSPITAL_COMMUNITY)
Admission: RE | Admit: 2023-05-14 | Discharge: 2023-05-14 | Disposition: A | Discharge status: Home / Self Care | Payer: Medicare Other | Source: Ambulatory Visit | Attending: Internal Medicine | Admitting: Internal Medicine

## 2023-05-14 ENCOUNTER — Inpatient Hospital Stay: Payer: Medicare Other

## 2023-05-14 DIAGNOSIS — C3490 Malignant neoplasm of unspecified part of unspecified bronchus or lung: Secondary | ICD-10-CM | POA: Insufficient documentation

## 2023-05-14 DIAGNOSIS — C3492 Malignant neoplasm of unspecified part of left bronchus or lung: Secondary | ICD-10-CM

## 2023-05-14 DIAGNOSIS — Z5112 Encounter for antineoplastic immunotherapy: Secondary | ICD-10-CM | POA: Diagnosis not present

## 2023-05-14 LAB — CBC WITH DIFFERENTIAL (CANCER CENTER ONLY)
Abs Immature Granulocytes: 0.02 10*3/uL (ref 0.00–0.07)
Basophils Absolute: 0.1 10*3/uL (ref 0.0–0.1)
Basophils Relative: 1 %
Eosinophils Absolute: 0.3 10*3/uL (ref 0.0–0.5)
Eosinophils Relative: 4 %
HCT: 38.8 % — ABNORMAL LOW (ref 39.0–52.0)
Hemoglobin: 12.9 g/dL — ABNORMAL LOW (ref 13.0–17.0)
Immature Granulocytes: 0 %
Lymphocytes Relative: 43 %
Lymphs Abs: 3.6 10*3/uL (ref 0.7–4.0)
MCH: 28.6 pg (ref 26.0–34.0)
MCHC: 33.2 g/dL (ref 30.0–36.0)
MCV: 86 fL (ref 80.0–100.0)
Monocytes Absolute: 0.8 10*3/uL (ref 0.1–1.0)
Monocytes Relative: 10 %
Neutro Abs: 3.4 10*3/uL (ref 1.7–7.7)
Neutrophils Relative %: 42 %
Platelet Count: 206 10*3/uL (ref 150–400)
RBC: 4.51 MIL/uL (ref 4.22–5.81)
RDW: 14.2 % (ref 11.5–15.5)
WBC Count: 8.3 10*3/uL (ref 4.0–10.5)
nRBC: 0 % (ref 0.0–0.2)

## 2023-05-14 LAB — CMP (CANCER CENTER ONLY)
ALT: 27 U/L (ref 0–44)
AST: 30 U/L (ref 15–41)
Albumin: 4.3 g/dL (ref 3.5–5.0)
Alkaline Phosphatase: 82 U/L (ref 38–126)
Anion gap: 12 (ref 5–15)
BUN: 23 mg/dL (ref 8–23)
CO2: 22 mmol/L (ref 22–32)
Calcium: 9.4 mg/dL (ref 8.9–10.3)
Chloride: 101 mmol/L (ref 98–111)
Creatinine: 1.35 mg/dL — ABNORMAL HIGH (ref 0.61–1.24)
GFR, Estimated: 56 mL/min — ABNORMAL LOW (ref >60)
Glucose, Bld: 253 mg/dL — ABNORMAL HIGH (ref 70–99)
Potassium: 3.7 mmol/L (ref 3.5–5.1)
Sodium: 135 mmol/L (ref 135–145)
Total Bilirubin: 0.4 mg/dL (ref 0.3–1.2)
Total Protein: 7.7 g/dL (ref 6.5–8.1)

## 2023-05-14 LAB — TSH: TSH: 5.344 u[IU]/mL — ABNORMAL HIGH (ref 0.350–4.500)

## 2023-05-14 MED ORDER — IOHEXOL 300 MG/ML  SOLN
75.0000 mL | Freq: Once | INTRAMUSCULAR | Status: AC | PRN
  Administered 2023-05-14: 75 mL via INTRAVENOUS

## 2023-05-15 ENCOUNTER — Inpatient Hospital Stay (HOSPITAL_BASED_OUTPATIENT_CLINIC_OR_DEPARTMENT_OTHER): Payer: Medicare Other | Admitting: Physician Assistant

## 2023-05-15 DIAGNOSIS — C3492 Malignant neoplasm of unspecified part of left bronchus or lung: Secondary | ICD-10-CM | POA: Diagnosis not present

## 2023-05-15 DIAGNOSIS — Z5112 Encounter for antineoplastic immunotherapy: Secondary | ICD-10-CM | POA: Diagnosis not present

## 2023-05-15 MED ORDER — GABAPENTIN 300 MG PO CAPS
300.0000 mg | ORAL_CAPSULE | Freq: Three times a day (TID) | ORAL | 1 refills | Status: DC
Start: 2023-05-15 — End: 2024-03-16

## 2023-05-15 MED ORDER — METHOCARBAMOL 500 MG PO TABS
500.0000 mg | ORAL_TABLET | Freq: Three times a day (TID) | ORAL | 2 refills | Status: DC
Start: 2023-05-15 — End: 2023-11-03

## 2023-05-17 ENCOUNTER — Other Ambulatory Visit: Payer: Self-pay

## 2023-08-16 ENCOUNTER — Other Ambulatory Visit: Payer: Self-pay

## 2023-08-29 ENCOUNTER — Telehealth: Payer: Self-pay | Admitting: Internal Medicine

## 2023-08-29 NOTE — Telephone Encounter (Signed)
Called patient regarding upcoming September and October appointments, patient is nitrified.

## 2023-09-09 ENCOUNTER — Inpatient Hospital Stay: Payer: Medicare Other | Attending: Internal Medicine

## 2023-09-09 ENCOUNTER — Other Ambulatory Visit: Payer: Medicare Other

## 2023-09-09 ENCOUNTER — Ambulatory Visit (HOSPITAL_COMMUNITY)
Admission: RE | Admit: 2023-09-09 | Discharge: 2023-09-09 | Disposition: A | Discharge status: Home / Self Care | Payer: Medicare Other | Source: Ambulatory Visit | Attending: Physician Assistant

## 2023-09-09 DIAGNOSIS — C3492 Malignant neoplasm of unspecified part of left bronchus or lung: Secondary | ICD-10-CM

## 2023-09-09 DIAGNOSIS — I700 Atherosclerosis of aorta: Secondary | ICD-10-CM | POA: Diagnosis not present

## 2023-09-09 DIAGNOSIS — K760 Fatty (change of) liver, not elsewhere classified: Secondary | ICD-10-CM | POA: Diagnosis not present

## 2023-09-09 DIAGNOSIS — Z902 Acquired absence of lung [part of]: Secondary | ICD-10-CM | POA: Insufficient documentation

## 2023-09-09 DIAGNOSIS — Z85118 Personal history of other malignant neoplasm of bronchus and lung: Secondary | ICD-10-CM | POA: Diagnosis present

## 2023-09-09 LAB — CMP (CANCER CENTER ONLY)
ALT: 35 U/L (ref 0–44)
AST: 49 U/L — ABNORMAL HIGH (ref 15–41)
Albumin: 4.2 g/dL (ref 3.5–5.0)
Alkaline Phosphatase: 83 U/L (ref 38–126)
Anion gap: 12 (ref 5–15)
BUN: 23 mg/dL (ref 8–23)
CO2: 23 mmol/L (ref 22–32)
Calcium: 9.3 mg/dL (ref 8.9–10.3)
Chloride: 100 mmol/L (ref 98–111)
Creatinine: 1.41 mg/dL — ABNORMAL HIGH (ref 0.61–1.24)
GFR, Estimated: 53 mL/min — ABNORMAL LOW (ref >60)
Glucose, Bld: 294 mg/dL — ABNORMAL HIGH (ref 70–99)
Potassium: 3.6 mmol/L (ref 3.5–5.1)
Sodium: 135 mmol/L (ref 135–145)
Total Bilirubin: 0.8 mg/dL (ref 0.3–1.2)
Total Protein: 7.6 g/dL (ref 6.5–8.1)

## 2023-09-09 LAB — CBC WITH DIFFERENTIAL (CANCER CENTER ONLY)
Abs Immature Granulocytes: 0.02 10*3/uL (ref 0.00–0.07)
Basophils Absolute: 0.1 10*3/uL (ref 0.0–0.1)
Basophils Relative: 1 %
Eosinophils Absolute: 0.2 10*3/uL (ref 0.0–0.5)
Eosinophils Relative: 2 %
HCT: 39.6 % (ref 39.0–52.0)
Hemoglobin: 13.5 g/dL (ref 13.0–17.0)
Immature Granulocytes: 0 %
Lymphocytes Relative: 37 %
Lymphs Abs: 2.8 10*3/uL (ref 0.7–4.0)
MCH: 28.9 pg (ref 26.0–34.0)
MCHC: 34.1 g/dL (ref 30.0–36.0)
MCV: 84.8 fL (ref 80.0–100.0)
Monocytes Absolute: 0.7 10*3/uL (ref 0.1–1.0)
Monocytes Relative: 10 %
Neutro Abs: 3.8 10*3/uL (ref 1.7–7.7)
Neutrophils Relative %: 50 %
Platelet Count: 181 10*3/uL (ref 150–400)
RBC: 4.67 MIL/uL (ref 4.22–5.81)
RDW: 14.4 % (ref 11.5–15.5)
WBC Count: 7.5 10*3/uL (ref 4.0–10.5)
nRBC: 0 % (ref 0.0–0.2)

## 2023-09-09 MED ORDER — IOHEXOL 300 MG/ML  SOLN
75.0000 mL | Freq: Once | INTRAMUSCULAR | Status: AC | PRN
  Administered 2023-09-09: 75 mL via INTRAVENOUS

## 2023-09-09 MED ORDER — SODIUM CHLORIDE (PF) 0.9 % IJ SOLN
INTRAMUSCULAR | Status: AC
  Filled 2023-09-09: qty 50

## 2023-09-11 ENCOUNTER — Ambulatory Visit: Payer: Medicare Other | Admitting: Physician Assistant

## 2023-09-16 ENCOUNTER — Inpatient Hospital Stay: Payer: Medicare Other | Attending: Internal Medicine | Admitting: Internal Medicine

## 2023-09-16 VITALS — BP 125/78 | HR 69 | Temp 97.4°F | Resp 17 | Ht 71.0 in | Wt 221.3 lb

## 2023-09-16 DIAGNOSIS — C3490 Malignant neoplasm of unspecified part of unspecified bronchus or lung: Secondary | ICD-10-CM

## 2023-09-16 DIAGNOSIS — C3412 Malignant neoplasm of upper lobe, left bronchus or lung: Secondary | ICD-10-CM | POA: Diagnosis present

## 2023-09-16 DIAGNOSIS — R7989 Other specified abnormal findings of blood chemistry: Secondary | ICD-10-CM | POA: Insufficient documentation

## 2023-09-16 DIAGNOSIS — E1165 Type 2 diabetes mellitus with hyperglycemia: Secondary | ICD-10-CM | POA: Diagnosis not present

## 2023-09-16 DIAGNOSIS — E1122 Type 2 diabetes mellitus with diabetic chronic kidney disease: Secondary | ICD-10-CM | POA: Insufficient documentation

## 2023-09-16 DIAGNOSIS — E1142 Type 2 diabetes mellitus with diabetic polyneuropathy: Secondary | ICD-10-CM | POA: Diagnosis not present

## 2023-09-16 DIAGNOSIS — Z9221 Personal history of antineoplastic chemotherapy: Secondary | ICD-10-CM | POA: Diagnosis not present

## 2023-09-16 DIAGNOSIS — N189 Chronic kidney disease, unspecified: Secondary | ICD-10-CM | POA: Insufficient documentation

## 2023-09-16 DIAGNOSIS — Z902 Acquired absence of lung [part of]: Secondary | ICD-10-CM | POA: Diagnosis not present

## 2023-09-16 NOTE — Progress Notes (Signed)
[facility] [phone]   Fax:([phone]  OFFICE PROGRESS NOTE  Si Gaul, MD [address] [place] [address]  DIAGNOSIS: Stage IIIA (T1c, N2, M0) non-small cell lung cancer, moderately differentiated adenocarcinoma diagnosed in December 2022.  PD-L1 expression is 70%.    Molecular study showed no actionable mutation.  It has only MAP2K1  PRIOR THERAPY:  1) Status post robotic assisted left video thoracoscopy with emergency thoracotomy and left upper lobectomy and mediastinal lymph node sampling under the care of Dr. Cliffton Asters on November 15, 2021. 2) Adjuvant systemic chemotherapy with carboplatin for AUC of 6 and paclitaxel 200 Mg/M2 with Neulasta support every 3 weeks.  First dose January 08, 2022.  The patient was not eligible for treatment with cisplatin and Alimta because of renal insufficiency.  Status post 4 cycles.  Last dose was given on March 11, 2022. 3) Adjuvant treatment with immunotherapy with atezolizumab 1200 Mg IV every 3 weeks.  First dose 04/23/2022.  Status post 17 cycles.  CURRENT THERAPY: Observation.  INTERVAL HISTORY: [name] 72 y.o. male returns to the clinic today for follow-up visit.Discussed the use of AI scribe software for clinical note transcription with the patient, who gave verbal consent to proceed.  History of Present Illness   The patient, a 72 year old with a history of Stage IIIA (T1c, N2, M0) non-small cell lung cancer, moderately differentiated adenocarcinoma diagnosed in December 2022, underwent surgical intervention with  left upper lobectomy and mediastinal lymph node sampling under the care of Dr. Cliffton Asters on November 15, 2021, followed by four rounds of chemotherapy with cisplatin and paclitaxel. Subsequently, he completed a year of immunotherapy with Tecentriq, which concluded in April 2024. Since then, he has been under observation.  The patient reports experiencing numbness  in his feet, a symptom he attributes to the chemotherapy. He also mentions a blackish discoloration in his veins, although it is unclear whether this is related to the IV used for contrast during his CT scan.  In addition to his cancer treatment, the patient has been managing diabetes and kidney issues. His creatinine levels are slightly elevated, and he has been advised to hydrate more and control his blood sugar levels.  The patient also reports physical discomfort and reduced flexibility, particularly on his left side where a plate was inserted during surgery. He expresses difficulty in performing tasks that require flexibility, such as cutting his nails. He attributes this change to the surgical plate and the scarring from the surgery.       MEDICAL HISTORY: Past Medical History:  Diagnosis Date   Diabetes mellitus (HCC)    Dyslipidemia    Hypertension     ALLERGIES:  has No Known Allergies.  MEDICATIONS:  Current Outpatient Medications  Medication Sig Dispense Refill   atenolol (TENORMIN) 100 MG tablet Take 100 mg by mouth daily.     atorvastatin (LIPITOR) 20 MG tablet Take 20 mg by mouth daily.     famotidine (PEPCID) 40 MG tablet Take 40 mg by mouth daily.     gabapentin (NEURONTIN) 300 MG capsule Take 1 capsule (300 mg total) by mouth 3 (three) times daily. 270 capsule 1   losartan-hydrochlorothiazide (HYZAAR) 100-12.5 MG tablet Take 1 tablet by mouth daily.     metFORMIN (GLUCOPHAGE-XR) 500 MG 24 hr tablet Take 1,000 mg by mouth every evening.     methocarbamol (ROBAXIN) 500 MG tablet Take 1 tablet (500 mg total) by mouth 3 (three) times daily.  90 tablet 2   triamcinolone cream (KENALOG) 0.1 % Apply 1 Application topically 2 (two) times daily. 30 g 2   No current facility-administered medications for this visit.    SURGICAL HISTORY:  Past Surgical History:  Procedure Laterality Date   BRONCHIAL BIOPSY  11/15/2021   Procedure: BRONCHIAL BIOPSIES;  Surgeon: Josephine Igo, DO;  [facility];  Service: Pulmonary;;   BRONCHIAL BRUSHINGS  11/15/2021   Procedure: BRONCHIAL BRUSHINGS;  Surgeon: Josephine Igo, DO;  [facility];  Service: Pulmonary;;   FIDUCIAL MARKER PLACEMENT  11/15/2021   Procedure: FIDUCIAL DYE MARKING;  Surgeon: Josephine Igo, DO;  [facility]: [facility];  Service: Pulmonary;;   INGUINAL HERNIA REPAIR     INTERCOSTAL NERVE BLOCK Left 11/15/2021   Procedure: INTERCOSTAL NERVE BLOCK;  Surgeon: Corliss Skains, MD;  Location: [facility];  Service: Thoracic;  Laterality: Left;   LOBECTOMY Left 11/15/2021   Procedure: LEFT UPPER LOBECTOMY;  Surgeon: Corliss Skains, MD;  [facility];  Service: Thoracic;  Laterality: Left;   MEDIASTINAL EXPLORATION Left 11/15/2021   Procedure: MEDIASTINAL LYMPH NODE EXPLORATION;  Surgeon: Corliss Skains, MD;  [facility];  Service: Thoracic;  Laterality: Left;   NODE DISSECTION Left 11/15/2021   Procedure: NODE DISSECTION;  Surgeon: Corliss Skains, MD;  [facility];  Service: Thoracic;  Laterality: Left;   RIB PLATING Left 11/15/2021   Procedure: RIB PLATING;  Surgeon: Corliss Skains, MD;  [facility];  Service: Thoracic;  Laterality: Left;   THORACOTOMY/LOBECTOMY Left 11/15/2021   Procedure: EMERGENCY THORACOTOMY;  Surgeon: Corliss Skains, MD;  [facility];  Service: Thoracic;  Laterality: Left;   VIDEO BRONCHOSCOPY WITH RADIAL ENDOBRONCHIAL ULTRASOUND  11/15/2021   Procedure: VIDEO BRONCHOSCOPY WITH RADIAL ENDOBRONCHIAL ULTRASOUND;  Surgeon: Josephine Igo, DO;  [facility];  Service: Pulmonary;;    REVIEW OF SYSTEMS:  Constitutional: negative Eyes: negative Ears, nose, mouth, throat, and face: negative Respiratory: positive for pleurisy/chest pain Cardiovascular: negative Gastrointestinal: negative Genitourinary:negative Integument/breast: negative Hematologic/lymphatic: negative Musculoskeletal:negative Neurological: positive  for paresthesia Behavioral/Psych: negative Endocrine: negative Allergic/Immunologic: negative   PHYSICAL EXAMINATION: General appearance: alert, cooperative, and no distress Head: Normocephalic, without obvious abnormality, atraumatic Neck: no adenopathy, no JVD, supple, symmetrical, trachea midline, and thyroid not enlarged, symmetric, no tenderness/mass/nodules Lymph nodes: Cervical, supraclavicular, and axillary nodes normal. Resp: clear to auscultation bilaterally Back: symmetric, no curvature. ROM normal. No CVA tenderness. Cardio: regular rate and rhythm, S1, S2 normal, no murmur, click, rub or gallop GI: soft, non-tender; bowel sounds normal; no masses,  no organomegaly Extremities: extremities normal, atraumatic, no cyanosis or edema Neurologic: Alert and oriented X 3, normal strength and tone. Normal symmetric reflexes. Normal coordination and gait  ECOG PERFORMANCE STATUS: 1 - Symptomatic but completely ambulatory  Blood pressure 125/78, pulse 69, temperature (!) 97.4 F (36.3 C), temperature source Oral, resp. rate 17, height 5\' 11"  (1.803 m), weight 221 lb 4.8 oz (100.4 kg), SpO2 98%.  LABORATORY DATA: Lab Results  Component Value Date   WBC 7.5 09/09/2023   HGB 13.5 09/09/2023   HCT 39.6 09/09/2023   MCV 84.8 09/09/2023   PLT 181 09/09/2023      Chemistry      Component Value Date/Time   NA 135 09/09/2023 1208   K 3.6 09/09/2023 1208   CL 100 09/09/2023 1208   CO2 23 09/09/2023 1208   BUN 23 09/09/2023 1208   CREATININE 1.41 (H) 09/09/2023 1208  Component Value Date/Time   CALCIUM 9.3 09/09/2023 1208   ALKPHOS 83 09/09/2023 1208   AST 49 (H) 09/09/2023 1208   ALT 35 09/09/2023 1208   BILITOT 0.8 09/09/2023 1208       RADIOGRAPHIC STUDIES: No results found.  ASSESSMENT AND PLAN: This is a very pleasant 72 years old white male diagnosed with a stage IIIa (T1c, N2, M0) non-small cell lung cancer, adenocarcinoma presented with left upper lobe lung  nodule in addition to mediastinal lymphadenopathy diagnosed in December 2022.  The patient is status post left upper lobectomy with lymph node dissection.  The resection margins were negative for malignancy and there was no visceral pleural involvement but lymphovascular invasion. The patient has no actionable mutations and PD-L1 expression was 70% He completed 4 cycles of adjuvant systemic chemotherapy with carboplatin and paclitaxel.  He was not a good candidate for treatment with Alimta because of renal insufficiency. The patient is currently undergoing adjuvant treatment with immunotherapy with atezolizumab 1200 Mg IV every 3 weeks status post 17 cycles.   The patient is currently on observation. He had repeat CT scan of the chest performed recently.  I personally and independently reviewed the scan images in comparison to the previous scan and I do not see any concerning findings for disease recurrence or metastasis but I will wait for the final report for confirmation. Assessment and Plan    Non-small cell lung cancer Completed surgical resection, chemotherapy (Carboplatin and Paclitaxel), and one year of immunotherapy (Tecentriq) in April 2024. Currently under observation. Recent CT scan results pending, but preliminary review does not show concerning growth. -Await final CT scan report. If abnormal, follow-up sooner. If normal, follow-up in 4 months.  Peripheral neuropathy Reports numbness in feet, likely secondary to chemotherapy. -Continue Gabapentin and monitoring symptoms.  Hyperglycemia Elevated blood sugar noted on lab work, which can impact kidney function. -Refer to primary care physician for diabetes management.  Chronic Kidney Disease Elevated creatinine (1.41), likely secondary to diabetes and dehydration. -Encourage hydration and blood sugar control to protect kidney function.  Post-surgical changes Reports decreased flexibility and discomfort at surgical  site. -Attributed to scarring from lung cancer surgery. No specific intervention planned at this time.     He was advised to call immediately if he has any concerning symptoms in the interval. The patient voices understanding of current disease status and treatment options and is in agreement with the current care plan.  All questions were answered. The patient knows to call the clinic with any problems, questions or concerns. We can certainly see the patient much sooner if necessary.   Disclaimer: This note was dictated with voice recognition software. Similar sounding words can inadvertently be transcribed and may not be corrected upon review.

## 2023-09-18 ENCOUNTER — Encounter: Payer: Self-pay | Admitting: Internal Medicine

## 2023-09-18 ENCOUNTER — Other Ambulatory Visit: Payer: Self-pay

## 2023-09-23 ENCOUNTER — Encounter: Payer: Self-pay | Admitting: Physician Assistant

## 2023-11-02 ENCOUNTER — Other Ambulatory Visit: Payer: Self-pay | Admitting: Physician Assistant

## 2023-11-02 DIAGNOSIS — C3492 Malignant neoplasm of unspecified part of left bronchus or lung: Secondary | ICD-10-CM

## 2023-11-03 ENCOUNTER — Encounter: Payer: Self-pay | Admitting: Internal Medicine

## 2023-11-05 ENCOUNTER — Other Ambulatory Visit: Payer: Self-pay

## 2024-01-13 ENCOUNTER — Other Ambulatory Visit: Payer: Medicare Other

## 2024-01-13 ENCOUNTER — Inpatient Hospital Stay: Payer: Medicare Other | Attending: Internal Medicine

## 2024-01-13 ENCOUNTER — Ambulatory Visit (HOSPITAL_COMMUNITY)
Admission: RE | Admit: 2024-01-13 | Discharge: 2024-01-13 | Disposition: A | Discharge status: Home / Self Care | Payer: Medicare Other | Source: Ambulatory Visit | Attending: Internal Medicine | Admitting: Internal Medicine

## 2024-01-13 DIAGNOSIS — C3412 Malignant neoplasm of upper lobe, left bronchus or lung: Secondary | ICD-10-CM | POA: Diagnosis present

## 2024-01-13 DIAGNOSIS — C3490 Malignant neoplasm of unspecified part of unspecified bronchus or lung: Secondary | ICD-10-CM | POA: Insufficient documentation

## 2024-01-13 LAB — CMP (CANCER CENTER ONLY)
ALT: 40 U/L (ref 0–44)
AST: 49 U/L — ABNORMAL HIGH (ref 15–41)
Albumin: 4.3 g/dL (ref 3.5–5.0)
Alkaline Phosphatase: 70 U/L (ref 38–126)
Anion gap: 11 (ref 5–15)
BUN: 19 mg/dL (ref 8–23)
CO2: 25 mmol/L (ref 22–32)
Calcium: 9.2 mg/dL (ref 8.9–10.3)
Chloride: 102 mmol/L (ref 98–111)
Creatinine: 1.47 mg/dL — ABNORMAL HIGH (ref 0.61–1.24)
GFR, Estimated: 50 mL/min — ABNORMAL LOW (ref >60)
Glucose, Bld: 155 mg/dL — ABNORMAL HIGH (ref 70–99)
Potassium: 3.8 mmol/L (ref 3.5–5.1)
Sodium: 138 mmol/L (ref 135–145)
Total Bilirubin: 0.5 mg/dL (ref 0.0–1.2)
Total Protein: 8 g/dL (ref 6.5–8.1)

## 2024-01-13 LAB — CBC WITH DIFFERENTIAL (CANCER CENTER ONLY)
Abs Immature Granulocytes: 0.04 10*3/uL (ref 0.00–0.07)
Basophils Absolute: 0.1 10*3/uL (ref 0.0–0.1)
Basophils Relative: 1 %
Eosinophils Absolute: 0.3 10*3/uL (ref 0.0–0.5)
Eosinophils Relative: 3 %
HCT: 40.7 % (ref 39.0–52.0)
Hemoglobin: 13.8 g/dL (ref 13.0–17.0)
Immature Granulocytes: 0 %
Lymphocytes Relative: 33 %
Lymphs Abs: 3.6 10*3/uL (ref 0.7–4.0)
MCH: 28 pg (ref 26.0–34.0)
MCHC: 33.9 g/dL (ref 30.0–36.0)
MCV: 82.7 fL (ref 80.0–100.0)
Monocytes Absolute: 0.9 10*3/uL (ref 0.1–1.0)
Monocytes Relative: 9 %
Neutro Abs: 6 10*3/uL (ref 1.7–7.7)
Neutrophils Relative %: 54 %
Platelet Count: 230 10*3/uL (ref 150–400)
RBC: 4.92 MIL/uL (ref 4.22–5.81)
RDW: 13.6 % (ref 11.5–15.5)
WBC Count: 11 10*3/uL — ABNORMAL HIGH (ref 4.0–10.5)
nRBC: 0 % (ref 0.0–0.2)

## 2024-01-20 ENCOUNTER — Inpatient Hospital Stay: Payer: Medicare Other | Attending: Internal Medicine | Admitting: Internal Medicine

## 2024-01-20 ENCOUNTER — Ambulatory Visit: Payer: Medicare Other | Admitting: Internal Medicine

## 2024-01-20 VITALS — BP 127/83 | HR 91 | Temp 97.8°F | Resp 17 | Ht 71.0 in | Wt 217.7 lb

## 2024-01-20 DIAGNOSIS — C3490 Malignant neoplasm of unspecified part of unspecified bronchus or lung: Secondary | ICD-10-CM | POA: Diagnosis not present

## 2024-01-20 DIAGNOSIS — N289 Disorder of kidney and ureter, unspecified: Secondary | ICD-10-CM | POA: Insufficient documentation

## 2024-01-20 DIAGNOSIS — R12 Heartburn: Secondary | ICD-10-CM | POA: Diagnosis not present

## 2024-01-20 DIAGNOSIS — G8918 Other acute postprocedural pain: Secondary | ICD-10-CM | POA: Insufficient documentation

## 2024-01-20 DIAGNOSIS — C3412 Malignant neoplasm of upper lobe, left bronchus or lung: Secondary | ICD-10-CM | POA: Diagnosis present

## 2024-01-20 DIAGNOSIS — Z902 Acquired absence of lung [part of]: Secondary | ICD-10-CM | POA: Insufficient documentation

## 2024-01-20 NOTE — Progress Notes (Signed)
 [facility] Telephone:([phone]   Fax:(336) [phone]  OFFICE PROGRESS NOTE  Verdia Lombard, MD [address] [place] [address]  DIAGNOSIS: Stage IIIA (T1c, N2, M0) non-small cell lung cancer, moderately differentiated adenocarcinoma diagnosed in December 2022.  PD-L1 expression is 70%.    Molecular study showed no actionable mutation.  It has only MAP2K1  PRIOR THERAPY:  1) Status post robotic assisted left video thoracoscopy with emergency thoracotomy and left upper lobectomy and mediastinal lymph node sampling under the care of Dr. Shyrl on November 15, 2021. 2) Adjuvant systemic chemotherapy with carboplatin  for AUC of 6 and paclitaxel  200 Mg/M2 with Neulasta  support every 3 weeks.  First dose January 08, 2022.  The patient was not eligible for treatment with cisplatin and Alimta because of renal insufficiency.  Status post 4 cycles.  Last dose was given on March 11, 2022. 3) Adjuvant treatment with immunotherapy with atezolizumab  1200 Mg IV every 3 weeks.  First dose 04/23/2022.  Status post 17 cycles.  CURRENT THERAPY: Observation.  INTERVAL HISTORY: [name] 73 y.o. male returns to the clinic today for follow-up visit.Discussed the use of AI scribe software for clinical note transcription with the patient, who gave verbal consent to proceed.  History of Present Illness   [name] is a 73 year old male with stage 3A non-small cell lung cancer adenocarcinoma who presents with post-surgical pain and fatigue.  Diagnosed with stage 3A non-small cell lung cancer adenocarcinoma in December 2022, he underwent a left upper lobectomy. Since the surgery, he experiences significant fatigue and left-sided pain, particularly during physical activities, necessitating frequent breaks.  He is currently taking methocarbamol  three times daily to manage his symptoms, but faces difficulty obtaining this medication due to his  family doctor's refusal to prescribe it, causing frustration.  He occasionally experiences heartburn and recently had a week-long episode of widespread body pain, initially suspected to be related to starting Ozempic, but later concluded not to be the cause. He uses the muscle relaxant as needed for this pain.  A scan was performed last week, but the report is not yet available. He requests a printout of his recent blood work.       MEDICAL HISTORY: Past Medical History:  Diagnosis Date   Diabetes mellitus (HCC)    Dyslipidemia    Hypertension     ALLERGIES:  has no known allergies.  MEDICATIONS:  Current Outpatient Medications  Medication Sig Dispense Refill   atenolol (TENORMIN) 100 MG tablet Take 100 mg by mouth daily.     atorvastatin (LIPITOR) 20 MG tablet Take 20 mg by mouth daily.     famotidine  (PEPCID ) 40 MG tablet Take 40 mg by mouth daily.     gabapentin  (NEURONTIN ) 300 MG capsule Take 1 capsule (300 mg total) by mouth 3 (three) times daily. 270 capsule 1   losartan-hydrochlorothiazide (HYZAAR) 100-12.5 MG tablet Take 1 tablet by mouth daily.     metFORMIN (GLUCOPHAGE-XR) 500 MG 24 hr tablet Take 1,000 mg by mouth every evening.     methocarbamol  (ROBAXIN ) 500 MG tablet TAKE ONE TABLET BY MOUTH THREE TIMES A DAY 30 tablet 2   triamcinolone  cream (KENALOG ) 0.1 % Apply 1 Application topically 2 (two) times daily. 30 g 2   No current facility-administered medications for this visit.    SURGICAL HISTORY:  Past Surgical History:  Procedure Laterality Date   BRONCHIAL BIOPSY  11/15/2021   Procedure: BRONCHIAL BIOPSIES;  Surgeon: Brenna Adine CROME, DO;  [facility];  Service: Pulmonary;;   BRONCHIAL BRUSHINGS  11/15/2021   Procedure: BRONCHIAL BRUSHINGS;  Surgeon: Brenna Adine CROME, DO;  [facility];  Service: Pulmonary;;   FIDUCIAL MARKER PLACEMENT  11/15/2021   Procedure: FIDUCIAL DYE MARKING;  Surgeon: Brenna Adine CROME, DO;  [facility]: [facility];   Service: Pulmonary;;   INGUINAL HERNIA REPAIR     INTERCOSTAL NERVE BLOCK Left 11/15/2021   Procedure: INTERCOSTAL NERVE BLOCK;  Surgeon: Shyrl Linnie KIDD, MD;  [facility]: [facility];  Service: Thoracic;  Laterality: Left;   LOBECTOMY Left 11/15/2021   Procedure: LEFT UPPER LOBECTOMY;  Surgeon: Shyrl Linnie KIDD, MD;  [facility];  Service: Thoracic;  Laterality: Left;   MEDIASTINAL EXPLORATION Left 11/15/2021   Procedure: MEDIASTINAL LYMPH NODE EXPLORATION;  Surgeon: Shyrl Linnie KIDD, MD;  [facility];  Service: Thoracic;  Laterality: Left;   NODE DISSECTION Left 11/15/2021   Procedure: NODE DISSECTION;  Surgeon: Shyrl Linnie KIDD, MD;  [facility];  Service: Thoracic;  Laterality: Left;   RIB PLATING Left 11/15/2021   Procedure: RIB PLATING;  Surgeon: Shyrl Linnie KIDD, MD;  [facility];  Service: Thoracic;  Laterality: Left;   THORACOTOMY/LOBECTOMY Left 11/15/2021   Procedure: EMERGENCY THORACOTOMY;  Surgeon: Shyrl Linnie KIDD, MD;  [facility];  Service: Thoracic;  Laterality: Left;   VIDEO BRONCHOSCOPY WITH RADIAL ENDOBRONCHIAL ULTRASOUND  11/15/2021   Procedure: VIDEO BRONCHOSCOPY WITH RADIAL ENDOBRONCHIAL ULTRASOUND;  Surgeon: Brenna Adine CROME, DO;  [facility]: [facility];  Service: Pulmonary;;    REVIEW OF SYSTEMS:  Constitutional: negative Eyes: negative Ears, nose, mouth, throat, and face: negative Respiratory: negative Cardiovascular: negative Gastrointestinal: negative Genitourinary:negative Integument/breast: negative Hematologic/lymphatic: negative Musculoskeletal:negative Neurological: negative Behavioral/Psych: negative Endocrine: negative Allergic/Immunologic: negative   PHYSICAL EXAMINATION: General appearance: alert, cooperative, and no distress Head: Normocephalic, without obvious abnormality, atraumatic Neck: no adenopathy, no JVD, supple, symmetrical, trachea midline, and thyroid  not enlarged, symmetric, no  tenderness/mass/nodules Lymph nodes: Cervical, supraclavicular, and axillary nodes normal. Resp: clear to auscultation bilaterally Back: symmetric, no curvature. ROM normal. No CVA tenderness. Cardio: regular rate and rhythm, S1, S2 normal, no murmur, click, rub or gallop GI: soft, non-tender; bowel sounds normal; no masses,  no organomegaly Extremities: extremities normal, atraumatic, no cyanosis or edema Neurologic: Alert and oriented X 3, normal strength and tone. Normal symmetric reflexes. Normal coordination and gait  ECOG PERFORMANCE STATUS: 1 - Symptomatic but completely ambulatory  Blood pressure 127/83, pulse 91, temperature 97.8 F (36.6 C), temperature source Temporal, resp. rate 17, height 5' 11 (1.803 m), weight 217 lb 11.2 oz (98.7 kg), SpO2 97%.  LABORATORY DATA: Lab Results  Component Value Date   WBC 11.0 (H) 01/13/2024   HGB 13.8 01/13/2024   HCT 40.7 01/13/2024   MCV 82.7 01/13/2024   PLT 230 01/13/2024      Chemistry      Component Value Date/Time   NA 138 01/13/2024 0751   K 3.8 01/13/2024 0751   CL 102 01/13/2024 0751   CO2 25 01/13/2024 0751   BUN 19 01/13/2024 0751   CREATININE 1.47 (H) 01/13/2024 0751      Component Value Date/Time   CALCIUM  9.2 01/13/2024 0751   ALKPHOS 70 01/13/2024 0751   AST 49 (H) 01/13/2024 0751   ALT 40 01/13/2024 0751   BILITOT 0.5 01/13/2024 0751       RADIOGRAPHIC STUDIES: CT Chest Wo Contrast Result Date: 01/20/2024 CLINICAL DATA:  Follow-up non-small-cell lung carcinoma. *  Tracking Code: BO * EXAM: CT CHEST WITHOUT CONTRAST TECHNIQUE: Multidetector CT imaging of the chest was performed following the standard protocol without IV contrast. RADIATION DOSE REDUCTION: This exam was performed according to the departmental dose-optimization program which includes automated exposure control, adjustment of the mA and/or kV according to patient size and/or use of iterative reconstruction technique. COMPARISON:  09/09/2023  FINDINGS: Cardiovascular:  No acute findings. Mediastinum/Nodes: No masses or pathologically enlarged lymph nodes identified on this unenhanced exam. Lungs/Pleura: Stable postop changes from prior left upper lobectomy. No suspicious nodules or masses identified. No evidence of infiltrate or pleural effusion. Upper Abdomen:  Unremarkable. Musculoskeletal: No suspicious bone lesions. Multiple old left rib fracture deformities again seen with internal fixation hardware in 2 left lateral ribs. IMPRESSION: Stable exam. No evidence of recurrent or metastatic disease. Electronically Signed   By: Norleen DELENA Kil M.D.   On: 01/20/2024 08:34    ASSESSMENT AND PLAN: This is a very pleasant 73 years old white male diagnosed with a stage IIIa (T1c, N2, M0) non-small cell lung cancer, adenocarcinoma presented with left upper lobe lung nodule in addition to mediastinal lymphadenopathy diagnosed in December 2022.  The patient is status post left upper lobectomy with lymph node dissection.  The resection margins were negative for malignancy and there was no visceral pleural involvement but lymphovascular invasion. The patient has no actionable mutations and PD-L1 expression was 70% He completed 4 cycles of adjuvant systemic chemotherapy with carboplatin  and paclitaxel .  He was not a good candidate for treatment with Alimta because of renal insufficiency. The patient is currently undergoing adjuvant treatment with immunotherapy with atezolizumab  1200 Mg IV every 3 weeks status post 17 cycles.   The patient is currently on observation and he is feeling fine except for occasional aching pain. He had repeat CT scan of the chest performed recently but the final report is still pending at the time of the visit but now available and that showed no concerning findings for disease recurrence or metastasis.    Stage IIIA Non-Small Cell Lung Cancer (NSCLC) Adenocarcinoma Diagnosed December 2022. Underwent left upper lobectomy.  Experiencing post-surgical pain on the left side, exacerbated by physical activity. Recent scan images reviewed; awaiting radiologist's report. Reports fatigue and pain limiting physical activity. - Await radiologist's report on recent scan - Schedule follow-up in six months if scan is clear  Post-Surgical Pain Persistent pain at lobectomy site, especially during physical activity. Previously managed with methocarbamol . Difficulty obtaining prescription from family doctor. Discussed management by surgeon or family doctor. - Contact family doctor to discuss continuation of methocarbamol  prescription - Advise use of methocarbamol  as needed rather than three times daily  Heartburn Intermittent heartburn without specific triggers or associated symptoms. - Monitor symptoms and consider over-the-counter antacids if needed  General Health Maintenance Patient taking Ozempic, causing confusion regarding pain management. - Print and provide recent lab results to patient  Follow-up - Schedule follow-up appointment in six months if scan is clear - Contact patient if scan shows concerning findings.   The patient was advised to call immediately if he has any other concerning symptoms in the interval. The patient voices understanding of current disease status and treatment options and is in agreement with the current care plan.  All questions were answered. The patient knows to call the clinic with any problems, questions or concerns. We can certainly see the patient much sooner if necessary.   Disclaimer: This note was dictated with voice recognition software. Similar sounding words can inadvertently  be transcribed and may not be corrected upon review.

## 2024-01-22 ENCOUNTER — Other Ambulatory Visit: Payer: Self-pay

## 2024-01-22 ENCOUNTER — Other Ambulatory Visit: Payer: Self-pay | Admitting: Physician Assistant

## 2024-01-22 DIAGNOSIS — C3492 Malignant neoplasm of unspecified part of left bronchus or lung: Secondary | ICD-10-CM

## 2024-01-22 NOTE — Telephone Encounter (Signed)
 Called PCP office to ask to start filling as the patient is no longer undergoing treatment

## 2024-03-15 ENCOUNTER — Other Ambulatory Visit: Payer: Self-pay | Admitting: Physician Assistant

## 2024-03-15 DIAGNOSIS — C3492 Malignant neoplasm of unspecified part of left bronchus or lung: Secondary | ICD-10-CM

## 2024-07-12 ENCOUNTER — Inpatient Hospital Stay: Payer: Medicare Other | Attending: Internal Medicine

## 2024-07-12 DIAGNOSIS — Z9221 Personal history of antineoplastic chemotherapy: Secondary | ICD-10-CM | POA: Insufficient documentation

## 2024-07-12 DIAGNOSIS — C3490 Malignant neoplasm of unspecified part of unspecified bronchus or lung: Secondary | ICD-10-CM

## 2024-07-12 DIAGNOSIS — Z85118 Personal history of other malignant neoplasm of bronchus and lung: Secondary | ICD-10-CM | POA: Insufficient documentation

## 2024-07-12 LAB — CBC WITH DIFFERENTIAL (CANCER CENTER ONLY)
Abs Immature Granulocytes: 0.03 K/uL (ref 0.00–0.07)
Basophils Absolute: 0.1 K/uL (ref 0.0–0.1)
Basophils Relative: 1 %
Eosinophils Absolute: 0.2 K/uL (ref 0.0–0.5)
Eosinophils Relative: 3 %
HCT: 37.6 % — ABNORMAL LOW (ref 39.0–52.0)
Hemoglobin: 12.6 g/dL — ABNORMAL LOW (ref 13.0–17.0)
Immature Granulocytes: 0 %
Lymphocytes Relative: 47 %
Lymphs Abs: 4.6 K/uL — ABNORMAL HIGH (ref 0.7–4.0)
MCH: 28.8 pg (ref 26.0–34.0)
MCHC: 33.5 g/dL (ref 30.0–36.0)
MCV: 86 fL (ref 80.0–100.0)
Monocytes Absolute: 1 K/uL (ref 0.1–1.0)
Monocytes Relative: 10 %
Neutro Abs: 3.8 K/uL (ref 1.7–7.7)
Neutrophils Relative %: 39 %
Platelet Count: 195 K/uL (ref 150–400)
RBC: 4.37 MIL/uL (ref 4.22–5.81)
RDW: 14.5 % (ref 11.5–15.5)
WBC Count: 9.7 K/uL (ref 4.0–10.5)
nRBC: 0 % (ref 0.0–0.2)

## 2024-07-12 LAB — CMP (CANCER CENTER ONLY)
ALT: 22 U/L (ref 0–44)
AST: 25 U/L (ref 15–41)
Albumin: 4.2 g/dL (ref 3.5–5.0)
Alkaline Phosphatase: 54 U/L (ref 38–126)
Anion gap: 9 (ref 5–15)
BUN: 29 mg/dL — ABNORMAL HIGH (ref 8–23)
CO2: 26 mmol/L (ref 22–32)
Calcium: 9 mg/dL (ref 8.9–10.3)
Chloride: 103 mmol/L (ref 98–111)
Creatinine: 1.59 mg/dL — ABNORMAL HIGH (ref 0.61–1.24)
GFR, Estimated: 46 mL/min — ABNORMAL LOW (ref >60)
Glucose, Bld: 159 mg/dL — ABNORMAL HIGH (ref 70–99)
Potassium: 3.7 mmol/L (ref 3.5–5.1)
Sodium: 138 mmol/L (ref 135–145)
Total Bilirubin: 0.5 mg/dL (ref 0.0–1.2)
Total Protein: 7.5 g/dL (ref 6.5–8.1)

## 2024-07-13 ENCOUNTER — Ambulatory Visit (HOSPITAL_COMMUNITY)
Admission: RE | Admit: 2024-07-13 | Discharge: 2024-07-13 | Disposition: A | Discharge status: Home / Self Care | Source: Ambulatory Visit | Attending: Internal Medicine | Admitting: Internal Medicine

## 2024-07-13 DIAGNOSIS — C3490 Malignant neoplasm of unspecified part of unspecified bronchus or lung: Secondary | ICD-10-CM | POA: Insufficient documentation

## 2024-07-19 ENCOUNTER — Inpatient Hospital Stay: Payer: Medicare Other | Attending: Internal Medicine | Admitting: Internal Medicine

## 2024-09-10 ENCOUNTER — Other Ambulatory Visit: Payer: Self-pay | Admitting: Physician Assistant

## 2024-09-10 DIAGNOSIS — C3492 Malignant neoplasm of unspecified part of left bronchus or lung: Secondary | ICD-10-CM

## 2024-12-21 ENCOUNTER — Encounter: Payer: Self-pay | Admitting: *Deleted

## 2024-12-21 NOTE — Progress Notes (Signed)
 ASAHD CAN                                          MRN: [id number]   12/21/2024   The [facility] Quality Team Specialist reviewed this patient medical record for the purposes of chart review for care gap closure. The following were reviewed: chart review for care gap closure-glycemic status assessment.    [facility] Quality Team

## 2024-12-28 ENCOUNTER — Encounter: Payer: Self-pay | Admitting: Internal Medicine

## 2024-12-28 NOTE — Progress Notes (Signed)
 NN called pt's wife, Princella, to clarify the messages left for scheduler. Princella confirms the pt would like to be scheduled with a different provider, not a different facility. NN verbalized understanding. Message given to new pt scheduler.

## 2024-12-29 ENCOUNTER — Telehealth: Payer: Self-pay | Admitting: Oncology

## 2024-12-29 NOTE — Telephone Encounter (Signed)
 The patient and his wife called expressing their concern for needing a transfer of care within the practice. The medical team was notified of this change and directed to schedule the patient with another provider.   Called the patients and his wife and scheduled him for a 30-minute new patient appointment with Dr.Pasam with labs to follow (Per Dr.Pasam's instructions)). The patient and his wife are aware of the appointment details and are active on MyChart.

## 2024-12-30 ENCOUNTER — Other Ambulatory Visit: Payer: Self-pay

## 2025-01-12 ENCOUNTER — Inpatient Hospital Stay: Attending: Oncology | Admitting: Oncology

## 2025-01-12 ENCOUNTER — Inpatient Hospital Stay

## 2025-01-12 DIAGNOSIS — C3492 Malignant neoplasm of unspecified part of left bronchus or lung: Secondary | ICD-10-CM

## 2025-01-12 DIAGNOSIS — Z87891 Personal history of nicotine dependence: Secondary | ICD-10-CM | POA: Diagnosis not present

## 2025-01-12 DIAGNOSIS — Z9221 Personal history of antineoplastic chemotherapy: Secondary | ICD-10-CM | POA: Insufficient documentation

## 2025-01-12 DIAGNOSIS — Z79899 Other long term (current) drug therapy: Secondary | ICD-10-CM | POA: Insufficient documentation

## 2025-01-12 DIAGNOSIS — Z902 Acquired absence of lung [part of]: Secondary | ICD-10-CM | POA: Insufficient documentation

## 2025-01-12 DIAGNOSIS — Z85118 Personal history of other malignant neoplasm of bronchus and lung: Secondary | ICD-10-CM | POA: Insufficient documentation

## 2025-01-12 LAB — CMP (CANCER CENTER ONLY)
ALT: 14 U/L (ref 0–44)
AST: 26 U/L (ref 15–41)
Albumin: 4.6 g/dL (ref 3.5–5.0)
Alkaline Phosphatase: 57 U/L (ref 38–126)
Anion gap: 17 — ABNORMAL HIGH (ref 5–15)
BUN: 16 mg/dL (ref 8–23)
CO2: 22 mmol/L (ref 22–32)
Calcium: 9.4 mg/dL (ref 8.9–10.3)
Chloride: 100 mmol/L (ref 98–111)
Creatinine: 1.32 mg/dL — ABNORMAL HIGH (ref 0.61–1.24)
GFR, Estimated: 57 mL/min — ABNORMAL LOW
Glucose, Bld: 115 mg/dL — ABNORMAL HIGH (ref 70–99)
Potassium: 4 mmol/L (ref 3.5–5.1)
Sodium: 139 mmol/L (ref 135–145)
Total Bilirubin: 0.5 mg/dL (ref 0.0–1.2)
Total Protein: 8.2 g/dL — ABNORMAL HIGH (ref 6.5–8.1)

## 2025-01-12 LAB — CBC WITH DIFFERENTIAL (CANCER CENTER ONLY)
Abs Immature Granulocytes: 0.02 10*3/uL (ref 0.00–0.07)
Basophils Absolute: 0.1 10*3/uL (ref 0.0–0.1)
Basophils Relative: 1 %
Eosinophils Absolute: 0.1 10*3/uL (ref 0.0–0.5)
Eosinophils Relative: 1 %
HCT: 41 % (ref 39.0–52.0)
Hemoglobin: 13.9 g/dL (ref 13.0–17.0)
Immature Granulocytes: 0 %
Lymphocytes Relative: 40 %
Lymphs Abs: 3.7 10*3/uL (ref 0.7–4.0)
MCH: 29.4 pg (ref 26.0–34.0)
MCHC: 33.9 g/dL (ref 30.0–36.0)
MCV: 86.7 fL (ref 80.0–100.0)
Monocytes Absolute: 0.8 10*3/uL (ref 0.1–1.0)
Monocytes Relative: 9 %
Neutro Abs: 4.4 10*3/uL (ref 1.7–7.7)
Neutrophils Relative %: 49 %
Platelet Count: 280 10*3/uL (ref 150–400)
RBC: 4.73 MIL/uL (ref 4.22–5.81)
RDW: 13.5 % (ref 11.5–15.5)
WBC Count: 9.1 10*3/uL (ref 4.0–10.5)
nRBC: 0 % (ref 0.0–0.2)

## 2025-01-12 LAB — TSH: TSH: 1.79 u[IU]/mL (ref 0.350–4.500)

## 2025-01-12 NOTE — Progress Notes (Unsigned)
 "  [facility]  ONCOLOGY CLINIC PROGRESS NOTE   Patient Care Team: Verdia Lombard, MD as PCP - General (Internal Medicine)  PATIENT NAME: [name]   MR#: [id number] DOB: November 30, 1951  Date of visit: 01/12/2025   ASSESSMENT & PLAN:   [name] is a 74 y.o. gentleman with history of hypertension, dyslipidemia, diabetes mellitus, was diagnosed with stage III adenocarcinoma of left lung in December 2022 and underwent left upper lobectomy, followed by systemic adjuvant chemotherapy with carboplatin  and paclitaxel , followed by adjuvant immunotherapy with atezolizumab , completed treatments in May 2024 and is on surveillance.  Adenocarcinoma of left lung, stage 3 (HCC) Please review oncology history for additional details and timeline of events.  He was previously under the care of Dr. Sherrod in our clinic.  Stage III adenocarcinoma of left lung s/p lobectomy followed by adjuvant chemotherapy, followed by adjuvant immunotherapy, which was completed in May 2024.  He has been on surveillance without evidence of disease recurrence.  Last CT chest without contrast in July 2025 showed no evidence of disease recurrence.  Will continue surveillance as per NCCN guidelines.  Restaging CT chest will be obtained at least every 6 months until he completes 5 years from the time of diagnosis.  Restaging CT chest currently due.  Request placed today.  Will call him with results, once available.  CBCD unremarkable today.  CMP showed creatinine of 1.32, otherwise unremarkable.  TSH is now normal at 1.79.  RTC in 6 months for follow-up with another CT chest just prior to return visit.  - Provided anticipatory guidance regarding surveillance schedule: every six months until five years post-diagnosis, then annually.    I reviewed lab results and outside records for this visit and discussed relevant results with the patient. Diagnosis, plan of care and treatment options  were also discussed in detail with the patient. Opportunity provided to ask questions and answers provided to his apparent satisfaction. Provided instructions to call our clinic with any problems, questions or concerns prior to return visit. I recommended to continue follow-up with PCP and sub-specialists. He verbalized understanding and agreed with the plan.   NCCN guidelines have been consulted in the planning of this patients care.  I spent a total of 35 minutes during this encounter with the patient including review of chart and various tests results, discussions about plan of care and coordination of care plan.   Chinita Patten, MD  01/12/2025 2:50 PM  [facility] - A DEPT OF [facility] [address] [place] [address] Dept: [phone] Dept Fax: [phone]    CHIEF COMPLAINT/ REASON FOR VISIT:   Stage III A (T1c, N2, M0) non-small cell lung cancer, moderately differentiated adenocarcinoma diagnosed in December 2022  Current Treatment:  On surveillance as per NCCN guidelines.   INTERVAL HISTORY:    Discussed the use of AI scribe software for clinical note transcription with the patient, who gave verbal consent to proceed.  History of Present Illness [name] is a 74 year old male with stage III left lung cancer, status post surgical resection, adjuvant chemotherapy, and immunotherapy, who presents for routine oncology follow-up and surveillance imaging.  [name] was diagnosed with stage III malignant neoplasm of the left lung in December 2022, characterized by a 3.1 cm primary tumor with mediastinal lymph node involvement. Initial symptoms included mild cough and a near-syncopal episode, which led to further evaluation and diagnosis. He  subsequently underwent surgical resection, followed by four cycles of adjuvant chemotherapy over three months, and completed 17 cycles of  immunotherapy, with the final treatment in April 2024.  Since completion of therapy, he has remained asymptomatic from a pulmonary perspective, with no cough, dyspnea, or chest discomfort. He denies constitutional symptoms including fever, chills, night sweats, or significant weight loss. Appetite and hydration are stable. Surveillance CT scans have been performed every six months, with the most recent in July 2025 showing no evidence of disease recurrence. He is currently due for his next surveillance scan.  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 He is currently asymptomatic, with no headaches, vision changes, or dysphagia. He underwent prior eye surgery (type unspecified) and uses reading glasses, without new visual complaints.  He quit smoking and does not currently use tobacco. During the visit, he expressed concern regarding scheduling and continuity of care, which was addressed.   I have reviewed the past medical history, past surgical history, social history and family history with the patient and they are unchanged from previous note.  HISTORY OF PRESENT ILLNESS:   ONCOLOGY HISTORY:   Stage III A (T1c, N2, M0) non-small cell lung cancer, moderately differentiated adenocarcinoma diagnosed in December 2022, after he was noted to have a left upper lobe lung nodule in addition to mediastinal lymphadenopathy.   PD-L1 expression was 70%.     Molecular studies showed no actionable mutations. Only MAP2K1   PRIOR THERAPY:  1) Status post robotic assisted left video thoracoscopy with emergency thoracotomy and left upper lobectomy and mediastinal lymph node sampling under the care of Dr. Shyrl on November 15, 2021. 2) Adjuvant systemic chemotherapy with carboplatin  for AUC of 6 and paclitaxel  200 Mg/M2 with Neulasta  support every 3 weeks.  First dose January 08, 2022.  The patient was not  eligible for treatment with cisplatin and Alimta because of renal insufficiency.  Status post 4 cycles.  Last dose was given on March 11, 2022. 3) Adjuvant treatment with immunotherapy with atezolizumab  1200 Mg IV every 3 weeks.  First dose 04/23/2022. Completed 17 cycles as of 04/16/2023 and remains on surveillance with no evidence of disease recurrence.  Oncology History  Adenocarcinoma of left lung, stage 3 (HCC)  12/04/2021 Initial Diagnosis   Adenocarcinoma of left lung, stage 3 (HCC)   12/04/2021 Cancer Staging   Staging form: Lung, AJCC 8th Edition - Clinical: Stage IIIA (cT1c, cN2, cM0) - Signed by Sherrod Sherrod, MD on 12/04/2021   01/08/2022 - 03/14/2022 Chemotherapy   Patient is on Treatment Plan : LUNG Carboplatin  + Paclitaxel  q21d     04/23/2022 -  Chemotherapy   Patient is on Treatment Plan : LUNG NSCLC Atezolizumab  q21d         REVIEW OF SYSTEMS:   Review of Systems - Oncology  All other pertinent systems were reviewed with the patient and are negative.  ALLERGIES: He has no known allergies.  MEDICATIONS:  Current Outpatient Medications  Medication Sig Dispense Refill   atenolol (TENORMIN) 100 MG tablet Take 100 mg by mouth daily.     atorvastatin (LIPITOR) 20 MG tablet Take 20 mg by mouth daily.     gabapentin  (NEURONTIN ) 300 MG capsule TAKE 1 CAPSULE BY MOUTH 3 TIMES A DAY 270 capsule 1   losartan-hydrochlorothiazide (HYZAAR) 100-12.5 MG tablet Take 1 tablet by mouth daily.     metFORMIN (GLUCOPHAGE-XR) 500 MG 24 hr tablet Take 1,000 mg  by mouth every evening.     methocarbamol  (ROBAXIN ) 500 MG tablet TAKE ONE TABLET BY MOUTH THREE TIMES A DAY 30 tablet 2   No current facility-administered medications for this visit.     VITALS:   There were no vitals taken for this visit.  Wt Readings from Last 3 Encounters:  01/20/24 217 lb 11.2 oz (98.7 kg)  09/16/23 221 lb 4.8 oz (100.4 kg)  05/15/23 225 lb 8 oz (102.3 kg)    There is no height or weight on file to  calculate BMI.   Onc Performance Status - 01/12/25 1443       ECOG Perf Status   ECOG Perf Status Restricted in physically strenuous activity but ambulatory and able to carry out work of a light or sedentary nature, e.g., light house work, office work      KPS SCALE   KPS % SCORE Able to carry on normal activity, minor s/s of disease          PHYSICAL EXAM:   Physical Exam Constitutional:      General: He is not in acute distress.    Appearance: Normal appearance.  HENT:     Head: Normocephalic and atraumatic.  Eyes:     Conjunctiva/sclera: Conjunctivae normal.  Cardiovascular:     Rate and Rhythm: Normal rate and regular rhythm.  Pulmonary:     Effort: Pulmonary effort is normal. No respiratory distress.  Abdominal:     General: There is no distension.  Neurological:     General: No focal deficit present.     Mental Status: He is alert and oriented to person, place, and time.  Psychiatric:        Mood and Affect: Mood normal.        Behavior: Behavior normal.      LABORATORY DATA:   I have reviewed the data as listed.  Results for orders placed or performed in visit on 01/12/25  TSH  Result Value Ref Range   TSH 1.790 0.350 - 4.500 uIU/mL  CMP (Cancer Center only)  Result Value Ref Range   Sodium 139 135 - 145 mmol/L   Potassium 4.0 3.5 - 5.1 mmol/L   Chloride 100 98 - 111 mmol/L   CO2 22 22 - 32 mmol/L   Glucose, Bld 115 (H) 70 - 99 mg/dL   BUN 16 8 - 23 mg/dL   Creatinine 8.67 (H) 9.38 - 1.24 mg/dL   Calcium  9.4 8.9 - 10.3 mg/dL   Total Protein 8.2 (H) 6.5 - 8.1 g/dL   Albumin  4.6 3.5 - 5.0 g/dL   AST 26 15 - 41 U/L   ALT 14 0 - 44 U/L   Alkaline Phosphatase 57 38 - 126 U/L   Total Bilirubin 0.5 0.0 - 1.2 mg/dL   GFR, Estimated 57 (L) >60 mL/min   Anion gap 17 (H) 5 - 15  CBC with Differential (Cancer Center Only)  Result Value Ref Range   WBC Count 9.1 4.0 - 10.5 K/uL   RBC 4.73 4.22 - 5.81 MIL/uL   Hemoglobin 13.9 13.0 - 17.0 g/dL   HCT 58.9  60.9 - 47.9 %   MCV 86.7 80.0 - 100.0 fL   MCH 29.4 26.0 - 34.0 pg   MCHC 33.9 30.0 - 36.0 g/dL   RDW 86.4 88.4 - 84.4 %   Platelet Count 280 150 - 400 K/uL   nRBC 0.0 0.0 - 0.2 %   Neutrophils Relative % 49 %   Neutro  Abs 4.4 1.7 - 7.7 K/uL   Lymphocytes Relative 40 %   Lymphs Abs 3.7 0.7 - 4.0 K/uL   Monocytes Relative 9 %   Monocytes Absolute 0.8 0.1 - 1.0 K/uL   Eosinophils Relative 1 %   Eosinophils Absolute 0.1 0.0 - 0.5 K/uL   Basophils Relative 1 %   Basophils Absolute 0.1 0.0 - 0.1 K/uL   Immature Granulocytes 0 %   Abs Immature Granulocytes 0.02 0.00 - 0.07 K/uL      RADIOGRAPHIC STUDIES:  I have personally reviewed the radiological images as listed and agree with the findings in the report.  CT Chest Wo Contrast CLINICAL DATA:  Non-small cell lung cancer, staging. * Tracking Code: BO *  EXAM: CT CHEST WITHOUT CONTRAST  TECHNIQUE: Multidetector CT imaging of the chest was performed following the standard protocol without IV contrast.  RADIATION DOSE REDUCTION: This exam was performed according to the departmental dose-optimization program which includes automated exposure control, adjustment of the mA and/or kV according to patient size and/or use of iterative reconstruction technique.  COMPARISON:  Multiple priors including CT January 13, 2024  FINDINGS: Cardiovascular: Aortic atherosclerosis. Coronary artery calcifications. Normal size heart. Trace pericardial effusion, similar to prior.  Mediastinum/Nodes: No suspicious thyroid  nodule. None no pathologically enlarged mediastinal, hilar or axillary lymph node noting limited sensitivity of the hilar structures on noncontrast enhanced examination. Esophagus is grossly unremarkable.  Lungs/Pleura: Similar postsurgical/post treatment from prior left upper lobectomy. No new suspicious nodularity along the suture line.  No new suspicious pulmonary nodules or masses. No pleural effusion. No  pneumothorax.  Upper Abdomen: No acute abnormality.  Musculoskeletal: No aggressive lytic or blastic lesion of bone. Prior left fourth and fifth anterolateral rib plate and screw fixation. Multilevel degenerative change of the spine with bridging anterior vertebral osteophytes. Degenerative changes bilateral shoulders.  IMPRESSION: 1. Similar postsurgical/post treatment changes from prior left upper lobectomy without evidence of local recurrence or metastatic disease in the chest. 2. Aortic atherosclerosis.  Aortic Atherosclerosis (ICD10-I70.0).  Electronically Signed   By: Reyes Holder M.D.   On: 07/18/2024 16:08   CODE STATUS:  Code Status History     Date Active Date Inactive Code Status Order ID Comments User Context   11/15/2021 1417 11/23/2021 1652 Full Code [id number]  Viviane Lemond BRAVO, PA-C Inpatient       Orders Placed This Encounter  Procedures   CT Chest Wo Contrast    Standing Status:   Future    Expected Date:   01/18/2025    Expiration Date:   01/12/2026    Preferred imaging location?:   [facility]   CBC with Differential (Cancer Center Only)    Standing Status:   Future    Number of Occurrences:   1    Expiration Date:   01/12/2026   CMP (Cancer Center only)    Standing Status:   Future    Number of Occurrences:   1    Expiration Date:   01/12/2026   TSH    Standing Status:   Future    Number of Occurrences:   1    Expiration Date:   01/12/2026     Future Appointments  Date Time Provider Department Center  07/20/2025  9:15 AM [facility] None  07/20/2025  9:45 AM Adaley Kiene, Chinita, MD [facility] None    This document was completed utilizing speech recognition software. Grammatical errors, random word insertions, pronoun errors, and incomplete sentences are an occasional consequence of  this system due to software limitations, ambient noise, and hardware issues. Any formal questions or concerns about the content, text or information  contained within the body of this dictation should be directly addressed to the provider for clarification.   "

## 2025-01-13 ENCOUNTER — Other Ambulatory Visit: Payer: Self-pay

## 2025-01-13 ENCOUNTER — Encounter: Payer: Self-pay | Admitting: Internal Medicine

## 2025-01-13 ENCOUNTER — Encounter: Payer: Self-pay | Admitting: Oncology

## 2025-01-13 NOTE — Assessment & Plan Note (Signed)
 Please review oncology history for additional details and timeline of events.  He was previously under the care of Dr. Sherrod in our clinic.  Stage III adenocarcinoma of left lung s/p lobectomy followed by adjuvant chemotherapy, followed by adjuvant immunotherapy, which was completed in May 2024.  He has been on surveillance without evidence of disease recurrence.  Last CT chest without contrast in July 2025 showed no evidence of disease recurrence.  Will continue surveillance as per NCCN guidelines.  Restaging CT chest will be obtained at least every 6 months until he completes 5 years from the time of diagnosis.  Restaging CT chest currently due.  Request placed today.  Will call him with results, once available.  CBCD unremarkable today.  CMP showed creatinine of 1.32, otherwise unremarkable.  TSH is now normal at 1.79.  RTC in 6 months for follow-up with another CT chest just prior to return visit.  - Provided anticipatory guidance regarding surveillance schedule: every six months until five years post-diagnosis, then annually.

## 2025-01-28 ENCOUNTER — Ambulatory Visit (HOSPITAL_COMMUNITY)

## 2025-07-20 ENCOUNTER — Inpatient Hospital Stay

## 2025-07-20 ENCOUNTER — Inpatient Hospital Stay: Admitting: Oncology
